# Patient Record
Sex: Female | Born: 1990 | Race: White | Hispanic: No | Marital: Married | Smoking: Never smoker
Health system: Southern US, Community
[De-identification: ages and names within clinical notes are randomized; demographics above are authoritative.]

## PROBLEM LIST (undated history)

## (undated) DIAGNOSIS — T7840XA Allergy, unspecified, initial encounter: Secondary | ICD-10-CM

## (undated) DIAGNOSIS — T4145XA Adverse effect of unspecified anesthetic, initial encounter: Secondary | ICD-10-CM

## (undated) DIAGNOSIS — I493 Ventricular premature depolarization: Secondary | ICD-10-CM

## (undated) DIAGNOSIS — F419 Anxiety disorder, unspecified: Secondary | ICD-10-CM

## (undated) DIAGNOSIS — D649 Anemia, unspecified: Secondary | ICD-10-CM

## (undated) DIAGNOSIS — F909 Attention-deficit hyperactivity disorder, unspecified type: Secondary | ICD-10-CM

## (undated) DIAGNOSIS — R112 Nausea with vomiting, unspecified: Secondary | ICD-10-CM

## (undated) DIAGNOSIS — Z8489 Family history of other specified conditions: Secondary | ICD-10-CM

## (undated) DIAGNOSIS — K603 Anal fistula, unspecified: Secondary | ICD-10-CM

## (undated) DIAGNOSIS — T8859XA Other complications of anesthesia, initial encounter: Secondary | ICD-10-CM

## (undated) DIAGNOSIS — Z9889 Other specified postprocedural states: Secondary | ICD-10-CM

## (undated) DIAGNOSIS — J45909 Unspecified asthma, uncomplicated: Secondary | ICD-10-CM

## (undated) DIAGNOSIS — K580 Irritable bowel syndrome with diarrhea: Secondary | ICD-10-CM

## (undated) DIAGNOSIS — Z01419 Encounter for gynecological examination (general) (routine) without abnormal findings: Secondary | ICD-10-CM

## (undated) HISTORY — DX: Anemia, unspecified: D64.9

## (undated) HISTORY — DX: Ventricular premature depolarization: I49.3

## (undated) HISTORY — PX: BREAST SURGERY: SHX581

## (undated) HISTORY — PX: REDUCTION MAMMAPLASTY: SUR839

## (undated) HISTORY — PX: TYMPANOPLASTY: SHX33

## (undated) HISTORY — DX: Anxiety disorder, unspecified: F41.9

## (undated) HISTORY — DX: Allergy, unspecified, initial encounter: T78.40XA

## (undated) HISTORY — PX: COSMETIC SURGERY: SHX468

## (undated) HISTORY — PX: TONSILLECTOMY: SUR1361

---
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
## 2009-01-20 LAB — URINALYSIS W/ RFLX MICROSCOPIC
Bilirubin UA, confirm: POSITIVE — AB
Glucose: 100 MG/DL — AB
Nitrites: POSITIVE — AB
Protein: 100 MG/DL — AB
Specific gravity: 1.03 (ref 1.003–1.030)
Urobilinogen: 4 EU/DL — ABNORMAL HIGH (ref 0.2–1.0)
pH (UA): 5 (ref 5.0–8.0)

## 2009-01-20 LAB — URINE MICROSCOPIC ONLY
RBC: 11 /HPF (ref 0–5)
WBC: 21 /HPF (ref 0–4)

## 2009-01-20 LAB — HCG URINE, QL: HCG urine, QL: NEGATIVE

## 2009-05-18 NOTE — Procedures (Signed)
[facility]   [address] [place], [place] [address]     NEUROSCIENCE CENTER   EVOKED POTENTIAL REPORT    PATIENT: Teresa Norman, Teresa Norman  MRN: [id number] DATE: 05/18/2009  BILLING: [id number] LOCATION:  REFERRING:  DICTATING: Emilynn Srinivasan J. Tawanna Solo, MD      VISUAL EVOKED POTENTIAL STUDY    IDENTIFICATION: A 18 year old patient of Dr. Lulu Riding.    INDICATION FOR STUDY: Loss of vision in left side and complains of  blurring of the vision since May of 2009, rule out optic neuritis.    INTRODUCTION: Pattern reversal visual evoked potential study was performed  by initially stimulating left eye at the rate of 1.9 per second. Check size  was 1.6 cm and the visual angle was 2 minutes of arc. Distance estimation  was 1 meter. There were a total of 400 stimuli given, and the study was  then repeated on the right side under a similar setting.    DESCRIPTION: N75, P100 and N145 waveforms were well identified on both  sides, and they were of normal amplitude and latencies. There was no  significant difference between the 2 sides.    IMPRESSION: This is a normal pattern reversal visual evoked potential  study. Clinical correlation suggested.       Electronically Signed   Dula Havlik J. Tawanna Solo, MD 05/22/2009 12:36   Corrie Brannen J. Tawanna Solo, MD    AJB:WMX  D: 05/18/2009 T: 05/18/2009 5:57 P  CQDocID #: [id number] CScriptDoc #: [id number]    cc: Ismail Graziani J. Tawanna Solo, MD

## 2009-05-18 NOTE — Procedures (Signed)
Procedures signed by  at 05/22/09  1236                 Author: Tawanna Solo, Minna Dumire J  Service: --  Author Type: Physician            Filed:   Date of Service: 05/18/09 1757  Status: Signed            Procedure Orders        1. VISUAL EVOKED RESPONSE [[id number]] ordered by  at 05/18/09 1757                         <!--EPICS-->                           [facility]<BR>                              [address]<BR>                         [place],  [place] [address]<BR> <BR>                             NEUROSCIENCE CENTER<BR>                           EVOKED POTENTIAL REPORT<BR> <BR> PATIENT:     Teresa Norman, Teresa Norman MRN:             [id number]       DATE:       05/18/2009<BR> BILLING:         [id number]       LOCATION:<BR> REFERRING:<BR> DICTATING:   Mykael Batz J. Kolter Reaver, MD<BR> <BR> <BR> VISUAL EVOKED POTENTIAL STUDY<BR> <BR> IDENTIFICATION:  A 18 year old patient of Dr. Edison Simon Marek Nghiem.<BR> <BR> INDICATION FOR STUDY:  Loss of vision in left side and complains  of<BR> blurring of the vision since May of 2009, rule out optic neuritis.<BR> <BR> INTRODUCTION:  Pattern reversal visual evoked potential study was performed<BR> by initially stimulating left eye at the rate of 1.9 per second. Check size<BR> was 1.6  cm and the visual angle was  2 minutes of arc. Distance estimation<BR> was 1 meter. There were a total of 400 stimuli given, and the study was<BR> then repeated on the right side under a similar setting.<BR> <BR> DESCRIPTION:  N75, P100 and N145 waveforms  were well identified on both<BR> sides, and they were of normal amplitude and latencies. There was no<BR> significant difference between the 2 sides.<BR> <BR> IMPRESSION:  This is a normal pattern reversal visual evoked potential<BR> study. Clinical correlation  suggested.<BR> <BR> <BR>       Electronically Signed<BR>       Nycere Presley J. Tawanna Solo, MD 05/22/2009 12:36<BR>                                     Kiana Hollar J. Trevel Dillenbeck, MD<BR> <BR> AJB:WMX<BR> D:  05/18/2009 T: 05/18/2009  5:57 P<BR> CQDocID #: [id number]  CScriptDoc  #: [id number]<BR> <BR> cc:   Breanah Faddis J. Raistlin Gum, MD<BR> <!--EPICE-->

## 2009-05-27 LAB — URINALYSIS W/ RFLX MICROSCOPIC
Bilirubin: NEGATIVE
Blood: NEGATIVE
Glucose: NEGATIVE MG/DL
Ketone: NEGATIVE MG/DL
Nitrites: NEGATIVE
Protein: NEGATIVE MG/DL
Specific gravity: 1.015 (ref 1.003–1.030)
Urobilinogen: 0.2 EU/DL (ref 0.2–1.0)
pH (UA): 6 (ref 5.0–8.0)

## 2009-05-27 LAB — HCG URINE, QL: HCG urine, QL: NEGATIVE

## 2009-05-27 LAB — URINE MICROSCOPIC ONLY: WBC: 4 /HPF (ref 0–4)

## 2009-05-28 LAB — CULTURE, URINE
Culture result:: NO GROWTH
Culture: NO GROWTH

## 2010-04-05 LAB — STREP THROAT SCREEN: Strep Screen: NEGATIVE

## 2010-08-23 LAB — HCG URINE, QL. - POC
Pregnancy test,urine (POC): NEGATIVE
Pregnancy test,urine (POC): NEGATIVE

## 2010-08-25 LAB — CBC WITH AUTOMATED DIFF
ABS. BASOPHILS: 0 10*3/uL (ref 0.0–0.06)
ABS. EOSINOPHILS: 0.1 10*3/uL (ref 0.0–0.4)
ABS. LYMPHOCYTES: 2.3 10*3/uL (ref 0.9–3.6)
ABS. MONOCYTES: 0.3 10*3/uL (ref 0.05–1.2)
ABS. NEUTROPHILS: 2.3 10*3/uL (ref 1.8–8.0)
BASOPHILS: 0 % (ref 0–2)
EOSINOPHILS: 2 % (ref 0–5)
HCT: 37.7 % (ref 35.0–45.0)
HGB: 12.9 g/dL (ref 12.0–16.0)
LYMPHOCYTES: 46 % (ref 21–52)
MCH: 28.5 PG (ref 24.0–34.0)
MCHC: 34.2 g/dL (ref 31.0–37.0)
MCV: 83.4 FL (ref 74.0–97.0)
MONOCYTES: 5 % (ref 3–10)
MPV: 11.3 FL (ref 9.2–11.8)
NEUTROPHILS: 47 % (ref 40–73)
PLATELET: 229 10*3/uL (ref 135–420)
RBC: 4.52 M/uL (ref 4.20–5.30)
RDW: 13.1 % (ref 11.6–14.5)
WBC: 5 10*3/uL (ref 4.6–13.2)

## 2010-08-25 LAB — METABOLIC PANEL, COMPREHENSIVE
A-G Ratio: 0.9 (ref 0.8–1.7)
ALT (SGPT): 34 U/L (ref 30–65)
AST (SGOT): 17 U/L (ref 15–37)
Albumin: 3.6 g/dL (ref 3.4–5.0)
Alk. phosphatase: 71 U/L (ref 50–136)
Anion gap: 10 mmol/L (ref 5–15)
BUN/Creatinine ratio: 13 (ref 12–20)
BUN: 9 MG/DL (ref 7–18)
Bilirubin, total: 0.5 MG/DL (ref 0.2–1.0)
CO2: 28 MMOL/L (ref 21–32)
Calcium: 9.1 MG/DL (ref 8.4–10.4)
Chloride: 102 MMOL/L (ref 100–108)
Creatinine: 0.7 MG/DL (ref 0.6–1.3)
GFR est AA: >60 mL/min/{1.73_m2} (ref >60)
GFR est non-AA: >60 mL/min/{1.73_m2} (ref >60)
Globulin: 3.8 g/dL (ref 2.0–4.0)
Glucose: 81 MG/DL (ref 74–99)
Potassium: 4 MMOL/L (ref 3.5–5.5)
Protein, total: 7.4 g/dL (ref 6.4–8.2)
Sodium: 140 MMOL/L (ref 136–145)

## 2010-08-25 LAB — URINE MICROSCOPIC ONLY
RBC: 0 /HPF (ref 0–5)
WBC: 4 /HPF (ref 0–4)

## 2010-08-25 LAB — URINALYSIS W/ RFLX MICROSCOPIC
Bilirubin: NEGATIVE
Glucose: NEGATIVE MG/DL
Ketone: NEGATIVE MG/DL
Leukocyte Esterase: NEGATIVE
Nitrites: NEGATIVE
Protein: NEGATIVE MG/DL
Specific gravity: 1.015 (ref 1.003–1.030)
Urobilinogen: 0.2 EU/DL (ref 0.2–1.0)
pH (UA): 6 (ref 5.0–8.0)

## 2010-08-25 LAB — LIPASE: Lipase: 184 U/L (ref 73–393)

## 2010-08-25 LAB — HCG URINE, QL: HCG urine, QL: NEGATIVE

## 2010-09-02 LAB — METABOLIC PANEL, BASIC
Anion gap: 10 mmol/L (ref 5–15)
BUN/Creatinine ratio: 11 — ABNORMAL LOW (ref 12–20)
BUN: 10 MG/DL (ref 7–18)
CO2: 30 MMOL/L (ref 21–32)
Calcium: 8.8 MG/DL (ref 8.4–10.4)
Chloride: 103 MMOL/L (ref 100–108)
Creatinine: 0.9 MG/DL (ref 0.6–1.3)
GFR est AA: >60 mL/min/{1.73_m2} (ref >60)
GFR est non-AA: >60 mL/min/{1.73_m2} (ref >60)
Glucose: 85 MG/DL (ref 74–99)
Potassium: 3.4 MMOL/L — ABNORMAL LOW (ref 3.5–5.5)
Sodium: 143 MMOL/L (ref 136–145)

## 2010-09-02 LAB — CBC WITH AUTOMATED DIFF
ABS. BASOPHILS: 0.1 10*3/uL — ABNORMAL HIGH (ref 0.0–0.06)
ABS. EOSINOPHILS: 0.1 10*3/uL (ref 0.0–0.4)
ABS. LYMPHOCYTES: 2.7 10*3/uL (ref 0.9–3.6)
ABS. MONOCYTES: 0.7 10*3/uL (ref 0.05–1.2)
ABS. NEUTROPHILS: 4.6 10*3/uL (ref 1.8–8.0)
BASOPHILS: 1 % (ref 0–2)
EOSINOPHILS: 2 % (ref 0–5)
HCT: 36.3 % (ref 35.0–45.0)
HGB: 12.3 g/dL (ref 12.0–16.0)
LYMPHOCYTES: 34 % (ref 21–52)
MCH: 28.6 PG (ref 24.0–34.0)
MCHC: 33.9 g/dL (ref 31.0–37.0)
MCV: 84.4 FL (ref 74.0–97.0)
MONOCYTES: 8 % (ref 3–10)
MPV: 10.9 FL (ref 9.2–11.8)
NEUTROPHILS: 55 % (ref 40–73)
PLATELET: 221 10*3/uL (ref 135–420)
RBC: 4.3 M/uL (ref 4.20–5.30)
RDW: 13.1 % (ref 11.6–14.5)
WBC: 8.1 10*3/uL (ref 4.6–13.2)

## 2010-09-02 LAB — URINALYSIS W/ RFLX MICROSCOPIC
Bilirubin: NEGATIVE
Glucose: NEGATIVE MG/DL
Ketone: NEGATIVE MG/DL
Leukocyte Esterase: NEGATIVE
Nitrites: NEGATIVE
Protein: NEGATIVE MG/DL
Specific gravity: 1.02 (ref 1.003–1.030)
Urobilinogen: 0.2 EU/DL (ref 0.2–1.0)
pH (UA): 6.5 (ref 5.0–8.0)

## 2010-09-02 LAB — URINE MICROSCOPIC ONLY
RBC: 11 /HPF (ref 0–5)
WBC: 0 /HPF (ref 0–4)

## 2010-09-02 LAB — HCG URINE, QL: HCG urine, QL: NEGATIVE

## 2011-07-23 ENCOUNTER — Other Ambulatory Visit

## 2011-07-23 MED ORDER — NORGESTIMATE-ETHINYL ESTRADIOL 0.18/0.215/0.25 MG-35 MCG(28) TAB
PACK | Freq: Every day | ORAL | Status: DC
Start: 2011-07-23 — End: 2011-08-08

## 2011-07-23 NOTE — Progress Notes (Signed)
SUBJECTIVE:  This a 20 year old white female nulligravida last menstrual period 12 5 2012 who presents for her well woman examination. She presently denies any GYN problems. She is interested in having Ortho Tri-Cyclen birth control pills prescribed. Her past medical history was reviewed and is documented. She had a significant surgery for breast reduction.[01]   OBJECTIVE:   Vital signs are stable.  She is well-developed well-nourished white female in no apparent distress.  HEENT exam revealed no thyromegaly.  Breasts had significant scars from her previous breast reduction surgery but no palpable masses.  Abdomen her soft and nontender.  External genitalia normal.  Vaginal canal revealed no significant discharge or blood.  Cervix had no lesions.  Uterus normal size and nontender.  Adnexa unremarkable[02]  ASSESSMENT:  Normal well woman exam[03]  PLAN:  1.  Patient will take Ortho Tri-Cyclen 28 daily.  2. Next well woman exam in one year.[04]

## 2011-07-23 NOTE — Patient Instructions (Signed)
MyChart Activation    Thank you for requesting access to MyChart. Please follow the instructions below to securely access and download your online medical record. MyChart allows you to send messages to your doctor, view your test results, renew your prescriptions, schedule appointments, and more.    How Do I Sign Up?    1. In your internet browser, go to [email or web address]  2. Click on the First Time User? Click Here link in the Sign In box. You will be redirect to the New Member Sign Up page.  3. Enter your MyChart Access Code exactly as it appears below. You will not need to use this code after you???ve completed the sign-up process. If you do not sign up before the expiration date, you must request a new code.    MyChart Access Code: [email or web address]  Expires: 10/21/2011  8:59 AM (This is the date your MyChart access code will expire)    4. Enter the last four digits of your Social Security Number (xxxx) and Date of Birth (mm/dd/yyyy) as indicated and click Submit. You will be taken to the next sign-up page.  5. Create a MyChart ID. This will be your MyChart login ID and cannot be changed, so think of one that is secure and easy to remember.  6. Create a MyChart password. You can change your password at any time.  7. Enter your Password Reset Question and Answer. This can be used at a later time if you forget your password.   8. Enter your e-mail address. You will receive e-mail notification when new information is available in MyChart.  9. Click Sign Up. You can now view and download portions of your medical record.  10. Click the Download Summary menu link to download a portable copy of your medical information.    Additional Information    If you have questions, please visit the Frequently Asked Questions section of the MyChart website at [email or web address] Remember, MyChart is NOT to be used for urgent needs. For medical emergencies, dial 911.

## 2011-07-26 LAB — CHLAMYDIA/GC PCR
Chlamydia trachomatis, NAA: NEGATIVE
Neisseria gonorrhoeae, NAA: NEGATIVE

## 2011-07-30 NOTE — Telephone Encounter (Signed)
Pt wants rx sent to [facility] for sprintec. It was sent to [facility].

## 2011-07-31 NOTE — Progress Notes (Signed)
HISTORY OF PRESENT ILLNESS    Teresa Norman is a 20 y.o. year old female who comes in today as a new patient to our practice to be seen for: Abnormal labs     HPI:   For 1.5 years has had issues with many BM after eating so had colonoscopy and Tx with levaquin which made things much better.  Has had pain in R hand which was worse with writing and saw Dr. Beatrix Fetters so tried steroid in wrist but didn't help much at all.  Has pain B/L hip R>L as well as R shoulder/elbow and feet swelling.  Arm worse in AM and leg issues worsen throughout the day.  Was bit by 2 ticks 3 years ago.  Was never treated for Lyme disease.  No rash.  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  No stretching, but used to dance.  No neck pain.  Normal xrays of both hands.  No other imaging.  Had breast reduction APR2009.    +FAM Hx remote lupus.  No RA.  + OA in family significantly.    Current Outpatient Prescriptions   Medication Sig Dispense Refill   ??? levalbuterol tartrate (XOPENEX HFA) 45 mcg/actuation inhaler Take  by inhalation.         ??? norgestimate-ethinyl estradiol (ORTHO TRI-CYCLEN, TRI-SPRINTEC) 0.18/0.215/0.25 mg-35 mcg (28) tablet Take 1 Tab by mouth daily.  3 Package  4     Past Medical History   Diagnosis Date   ??? Asthma    ??? Seasonal allergic rhinitis      No family history on file.  History     Social History   ??? Marital Status: Single     Spouse Name: N/A     Number of Children: N/A   ??? Years of Education: N/A     Social History Main Topics   ??? Smoking status: Never Smoker    ??? Smokeless tobacco: Never Used   ??? Alcohol Use: Yes   ??? Drug Use: No   ??? Sexually Active: Not on file     Other Topics Concern   ??? Not on file     Social History Narrative   ??? No narrative on file     ROS:  No F, C, NS, weight loss.  All other systems reviewed and negative.    Objective:  BP 118/88   Pulse 87   Temp(Src) 99.7 ??F (37.6 ??C) (Oral)   Resp 16   Ht 5' 2.99" (1.6 m)   Wt 140 lb (63.504 kg)   BMI 24.81 kg/m2   SpO2 98%   LMP 06/28/2011     GEN:  Appears stated age in NAD.  HEAD:  Normocephalic, Atraumatic.  NEURO:  Sensation intact light touch B/L upper extremities.  right hand dominant.  M/S:  Shoulder ROM Decreased  right.  Spurling's negative bilaterally  right Shoulder:  Empty can positive External rotation positive.  Internal rotation positive. O'Brien not tested.  SLAP not tested.  Strength +3/5 right intrinsic hand. Wrist, bicep.  Crossover test not tested.  Negative atrophy right hand, arm, shoulder.   Negative TTP at [employer] joint.  Apprehension test negative. Hawkins-Kennedy Test positive.  Neer Test positive.  TTP and tingling radiates with palp R serratus anterior  Yergason's test not tested.  Speed's test not tested.  + Obers R abd TTP greater trochanter.  + Mod Nobles compression B/L.  + TTP med patellar facets B/L.   EXT no Clubbing/cyanosis.  no edema.  Assessment/Plan:   1. Myalgia    2. Weakness of right hand    3. IT band syndrome      Orders Placed This Encounter   ??? C REACTIVE PROTEIN, QT   ??? SED RATE (ESR)   ??? EMG TWO EXTREMITIES UPPER     Standing Status: Future      Number of Occurrences:       Standing Expiration Date: 01/29/2012     Order Specific Question:  Reason for Exam:     Answer:  Weakness R wrist/hand   ??? levalbuterol tartrate (XOPENEX HFA) 45 mcg/actuation inhaler     Sig: Take  by inhalation.       Stretching for IT band.    Return 1 week after EMG.    Lyme titer negative as only 1 band of IgM positive and needs 2/3.  Also exposed 2-3 years ago, so should be IgG.

## 2011-08-01 LAB — C REACTIVE PROTEIN, QT: C-Reactive Protein, Qt: 7.9 mg/L — ABNORMAL HIGH (ref 0.0–4.9)

## 2011-08-01 LAB — SED RATE (ESR): Sed rate (ESR): 6 mm/hr (ref 0–32)

## 2011-08-01 MED ORDER — NORGESTIMATE-ETHINYL ESTRADIOL 0.25 MG-35 MCG TAB
PACK | Freq: Every day | ORAL | Status: AC
Start: 2011-08-01 — End: ?

## 2011-08-01 NOTE — Progress Notes (Signed)
Addended by: Aundra Dubin. on: 08/01/2011 04:40 PM     Modules accepted: Orders

## 2011-08-08 LAB — AMB POC RAPID STREP A: Group A Strep Ag: NEGATIVE

## 2011-08-08 MED ORDER — FLUTICASONE 50 MCG/ACTUATION NASAL SPRAY, SUSP
50 mcg/actuation | Freq: Every day | NASAL | Status: DC
Start: 2011-08-08 — End: 2011-09-18

## 2011-08-08 NOTE — Patient Instructions (Addendum)
Use nasal saline rinse or Neti pot with distilled water for congestion.    Nasal saline irrigation recipe:  3 tsp sea salt (without iodine), 1 tsp baking soda, 8 oz distilled water.    Over the counter decongestant (like Sudafed) as needed.  May use Nasal decongestant spray (like Afrin) as needed for max of three days.  Rest often and drink plenty of fluids.    Seek care if concerned.      Viral Respiratory Infection: After Your Visit  Your Care Instructions  Viruses are very small organisms that multiply once they enter your body. There are many types of viruses, and they cause all kinds of illnesses, including colds and the mumps. Symptoms of viral respiratory infection, such as fever, sore throat, and runny nose, usually come on quickly. Often when you have a viral respiratory infection, you do not feel well and may not feel like eating much.  Most viral respiratory infections are not serious and will get better with time and self-care. Antibiotics are not used to treat a viral infection and will not help cure a viral illness. In some cases, antiviral medicine can help your body fight a serious viral infection.  Follow-up care is a key part of your treatment and safety. Be sure to make and go to all appointments, and call your doctor if you are having problems. It???s also a good idea to know your test results and keep a list of the medicines you take.  How can you care for yourself at home?  ?? Rest as much as possible until you feel better.   ?? Take your medicine exactly as prescribed. Call your doctor if you think you are having a problem with your medicine. You will get more details on the specific medicine your doctor prescribes.    ?? Take an over-the-counter pain medicine, such as acetaminophen (Tylenol), ibuprofen (Advil, Motrin), or naproxen (Aleve), as needed for pain and fever. Read and follow all instructions on the label. Do not give aspirin to anyone younger than 20. It has been linked to Reye syndrome, a serious illness.   ?? Drink plenty of fluids, enough so that your urine is light yellow or clear like water. Hot fluids, such as tea or soup, may help relieve congestion in your nose and throat. If you have kidney, heart, or liver disease and have to limit fluids, talk with your doctor before you increase the amount of fluids you drink.   ?? Try to clear mucus from your lungs by breathing deeply and coughing.   ?? Gargle with warm salt water once an hour to help reduce swelling and throat pain. Use 1 teaspoon of salt mixed in 1 cup of warm water.   ?? Do not smoke or allow others to smoke around you. If you need help quitting, talk to your doctor about stop-smoking programs and medicines. These can increase your chances of quitting for good.   To avoid spreading the virus  ?? Cough or sneeze into a tissue, and then throw it away.   ?? If you do not have a tissue, cover your cough or sneeze with your hand, and then clean your hand. You can also cough into your sleeve.   ?? Wash your hands with soap and warm water frequently. Wash for 15 to 20 seconds each time.   ?? If you do not have soap and water nearby, alcohol-based hand wipes or gel will work.   When should you call for help?    Call your doctor now or seek immediate medical care if:  ?? You have a new or higher fever.   ?? Your fever lasts more than 48 hours.   ?? You have trouble breathing.   ?? You have a fever with a stiff neck or a severe headache.   ?? You are sensitive to light or feel very sleepy or confused.   Watch closely for changes in your health, and be sure to contact your doctor if:  ?? You do not get better as expected.     Where can you learn more?     Go to http://www.healthwise.net/[facility]   Enter Q795 in the search box to learn more about "Viral Respiratory Infection: After Your Visit."    ?? 2006-2012 Healthwise, Inc[employer]. Care instructions adapted under license by [employer] (which disclaims liability or warranty for this information). This care instruction is for use with your licensed healthcare professional. If you have questions about a medical condition or this instruction, always ask your healthcare professional. Healthwise, Incorporated disclaims any warranty or liability for your use of this information.  Content Version: 9.4.94723; Last Revised: July 14, 2009

## 2011-08-08 NOTE — Progress Notes (Signed)
HISTORY OF PRESENT ILLNESS    Teresa Norman is a 20 y.o. year old female who is coming in today for a follow up for:  Sore throat      Patients symptoms have been present for 3-4 days.  Pain level 8, It has significantly worsened with sallowing.  Patient has tried:  Zyrtec w/o benefit.  It is described as raw sore throat with minimal cough. No F, C, but 99.something Saturday.    Current Outpatient Prescriptions   Medication Sig Dispense Refill   ??? norgestimate-ethinyl estradiol (SPRINTEC, 28,) 0.25-35 mg-mcg per tablet Take 1 Tab by mouth daily.  3 Package  4   ??? levalbuterol tartrate (XOPENEX HFA) 45 mcg/actuation inhaler Take  by inhalation.             Past Medical History   Diagnosis Date   ??? Asthma    ??? Seasonal allergic rhinitis      ROS:  Nose is fine.  Breathing is fine.    Objective:  BP 112/72   Pulse 99   Temp(Src) 97.2 ??F (36.2 ??C) (Oral)   Resp 16   Ht 5\' 2"  (1.575 m)   Wt 139 lb 9.6 oz (63.322 kg)   BMI 25.53 kg/m2   SpO2 98%   LMP 08/02/2011    GEN:  Appears stated age in NAD.  HEENT: Conjunctiva/lids normal.  External canals/nares normal.  TM normal bilaterally.   Nose: mucosa erythematous and swollen and clear rhinorrhea  Tongue midline.  Throat cobblestoning present. Exudates not present.  tonsils are present and normal.  NECK: Trachea midline.  No masses.  Anterior cervical adenopathy  CARDIAC:  regular rate and rhythm. no Murmur, no peripheral edema.  LUNGS: lungs clear to auscultation, no accessory muscle use.  MS: no clubbing/cyanosis.  SKIN: Warm/dry without rash.    Results for orders placed in visit on 08/08/11   AMB POC RAPID STREP A       Component Value Range    Group A Strep Ag negative  Negative     Assessment/Plan:   1. URI (upper respiratory infection)  fluticasone (FLONASE) 50 mcg/actuation nasal spray   2. Sore throat  AMB POC RAPID STREP A     Discussed need for OTC/supportive care.    RTC as needed.

## 2011-09-18 MED ORDER — PREDNISONE 10 MG TABLETS IN A DOSE PACK
10 mg | ORAL_TABLET | ORAL | Status: AC
Start: 2011-09-18 — End: ?

## 2011-09-18 MED ORDER — CYCLOBENZAPRINE 10 MG TAB
10 mg | ORAL_TABLET | Freq: Three times a day (TID) | ORAL | Status: AC | PRN
Start: 2011-09-18 — End: ?

## 2011-09-18 NOTE — Progress Notes (Signed)
HISTORY OF PRESENT ILLNESS    Teresa Norman is a 21 y.o. year old female here today to follow up for:  EMG results (find in Media)    Has had issues with RUQ pain and swelling for months.  EMG done [id number] was negative.  Does have issues where her hand will cramp up when trying to write things.  Doesn't seem to have any obvious cause, and will happen when she wakes in the morning.  Has only had aleve, ibuprofen thus far.    FAMHx: distant cutaneous lupus (grandmother's aunt), No RA    Current Outpatient Prescriptions   Medication Sig Dispense Refill   ??? norgestimate-ethinyl estradiol (SPRINTEC, 28,) 0.25-35 mg-mcg per tablet Take 1 Tab by mouth daily.  3 Package  4   ??? levalbuterol tartrate (XOPENEX HFA) 45 mcg/actuation inhaler Take  by inhalation.           Past Medical History   Diagnosis Date   ??? Asthma    ??? Seasonal allergic rhinitis      ROS:  +numb, tingle.  + weakness RUE.  + swelling.    Objective:  BP 100/72   Pulse 68   Temp(Src) 98 ??F (36.7 ??C) (Oral)   Resp 16   Ht 5\' 2"  (1.575 m)   Wt 136 lb (61.689 kg)   BMI 24.87 kg/m2   SpO2 98%  Bicep/tricep reflex +2/4 B/L.  Strength +4/5 intrinsic hand, tricep, bicep, wrist dorsiflexion/extension.  Decreased sensation B/L sides of hand and outer forearm and inner upper arm.  No atrophy.    Assessment/Plan:   1. Hand swelling  REFERRAL TO PHYSICIAL MEDICINE REHAB, predniSONE (STERAPRED DS) 10 mg dose pack, cyclobenzaprine (FLEXERIL) 10 mg tablet   2. Arm pain  REFERRAL TO PHYSICIAL MEDICINE REHAB, predniSONE (STERAPRED DS) 10 mg dose pack, cyclobenzaprine (FLEXERIL) 10 mg tablet   3. Arm weakness  REFERRAL TO PHYSICIAL MEDICINE REHAB, predniSONE (STERAPRED DS) 10 mg dose pack, cyclobenzaprine (FLEXERIL) 10 mg tablet     Will try PMR first as condition does not seem rheum with neg serologies prior.

## 2013-08-13 HISTORY — PX: LEEP: SHX91

## 2016-08-13 DIAGNOSIS — O139 Gestational [pregnancy-induced] hypertension without significant proteinuria, unspecified trimester: Secondary | ICD-10-CM

## 2016-08-13 HISTORY — DX: Gestational (pregnancy-induced) hypertension without significant proteinuria, unspecified trimester: O13.9

## 2016-08-20 ENCOUNTER — Ambulatory Visit
Admit: 2016-08-20 | Discharge: 2016-08-20 | Payer: PRIVATE HEALTH INSURANCE | Attending: Orthopaedic Surgery | Primary: Sports Medicine

## 2016-08-20 DIAGNOSIS — M898X1 Other specified disorders of bone, shoulder: Secondary | ICD-10-CM

## 2016-08-20 NOTE — Progress Notes (Signed)
Patient: Teresa Norman                MRN: [id number]       SSN: [id number]  Date of Birth: 1991/08/02        AGE: 26 y.o.        SEX: female  Body mass index is 28.35 kg/(m^2).    PCP: Steffanie Dunn, DO  08/20/16    Chief Complaint: Left shoulder pain    HPI: Teresa Norman is a 26 y.o. female who comes to the office today with a complaint of left shoulder pain following a fall.  On 08/19/2016 while out walking her dogs she slipped on ice and landed on her left side.  She initially had left hip pain, which is doing better, but that night noted worsening left shoulder/neck and chest pain from the fall.  She has been wearing a figure of 8 brace from her home, with some help.  She is also taking NSAIDs without complete resolution.  She complains of shoulder stiffness and neck pain accompanying the injury.  She has noted some numbness and tingling in the ulnar nerve distribution since starting to wear the figure of 8 brace.  No antecedent complaints.  She has not had any x rays.     Past Medical History:   Diagnosis Date   ??? Asthma    ??? Seasonal allergic rhinitis        History reviewed. No pertinent family history.    Current Outpatient Prescriptions   Medication Sig Dispense Refill   ??? levalbuterol tartrate (XOPENEX HFA) 45 mcg/actuation inhaler Take  by inhalation.       ??? predniSONE (STERAPRED DS) 10 mg dose pack See administration instruction per 10mg  dose pack 21 Tab 0   ??? cyclobenzaprine (FLEXERIL) 10 mg tablet Take 0.5-1 Tabs by mouth three (3) times daily as needed for Muscle Spasm(s). 60 Tab 1   ??? norgestimate-ethinyl estradiol (SPRINTEC, 28,) 0.25-35 mg-mcg per tablet Take 1 Tab by mouth daily. 3 Package 4       Allergies   Allergen Reactions   ??? Sulfa (Sulfonamide Antibiotics) Anaphylaxis and Itching   ??? Codeine Nausea and Vomiting   ??? Vicodin [Hydrocodone-Acetaminophen] Itching       Past Surgical History:   Procedure Laterality Date   ??? HX BREAST REDUCTION     ??? HX TONSILLECTOMY          Social History     Social History   ??? Marital status: SINGLE     Spouse name: N/A   ??? Number of children: N/A   ??? Years of education: N/A     Occupational History   ??? Not on file.     Social History Main Topics   ??? Smoking status: Never Smoker   ??? Smokeless tobacco: Never Used   ??? Alcohol use Yes   ??? Drug use: No   ??? Sexual activity: Not on file     Other Topics Concern   ??? Not on file     Social History Narrative       REVIEW OF SYSTEMS:      CON: negative for recent weight loss/gain, fever, or chills  EYE: negative for double or blurry vision  ENT: negative for hoarseness  RS:   negative for cough, URI, SOB  CV:  negative for chest pain, palpitations  GI:    negative for blood in stool, nausea/vomiting  GU:  negative for blood in urine  MS: As per HPI  Other systems reviewed and noted below.    PHYSICAL EXAMINATION:  GENERAL: Alert and oriented x3, in no acute distress, well-developed, well-nourished.  HEENT: Normocephalic, atraumatic.    RESP: Non labored breathing with equal chest rise on inspiration.  CV: Well perfused extremities.  No cyanosis or clubbing noted.  ABDOMEN: Soft, non-tender, non-distended.   SPINE: C spine with paraspinal TTP.  No midline TTP.  Full neck ROM.  Pain with lateral rotation and head tilt.  GAIT: Normal gait  MUSCULOSKELETAL: Left shoulder without signs of trauma.  No ecchymosis.  No warmth.  Globally TTP over soft tissues about the shoulder including platysma attachment to clavicle, caracoid process, periscapular muscles, deltoid.  No acromial tenderness.  Shoulder ROM limited active and passive to FF 100, ER 10, IR PL Buttock. Pain with attempts past these limits.  SILT/NVI distally. 5/5 strength deltoid.  No weakness or pain with gentle shoulder ER/IR strength testing.        Radiology: x rays of the left clavicle are negative for any fractures or other acute bony injury    Impression/Plan:   1. Left shoulder contusion:  I have recommended conservative treatment for  her injury at this time.  I do not see any fractures and most of her pain is soft tissue in origin.  I have encouraged her to start light ADL use of the left arm to hopefully prevent any worsening of her stiffness up to this point.  I will see her back in a week to recheck her shoulder.  All of her questions were answered.       Electronically signed by: Jim Likeaniel T Logun Colavito, MD

## 2016-08-20 NOTE — Patient Instructions (Addendum)
Bruises: Care Instructions  Your Care Instructions    Bruises occur when small blood vessels under the skin tear or rupture, most often from a twist, bump, or fall. Blood leaks into tissues under the skin and causes a black-and-blue spot that often turns colors, including purplish black, reddish blue, or yellowish green, as the bruise heals.  Bruises hurt, but most are not serious and will go away on their own within 2 to 4 weeks. Sometimes, gravity causes them to spread down the body. A leg bruise usually will take longer to heal than a bruise on the face or arms.  Follow-up care is a key part of your treatment and safety. Be sure to make and go to all appointments, and call your doctor if you are having problems. It's also a good idea to know your test results and keep a list of the medicines you take.  How can you care for yourself at home?  ?? Take pain medicines exactly as directed.  ?? If the doctor gave you a prescription medicine for pain, take it as prescribed.  ?? If you are not taking a prescription pain medicine, ask your doctor if you can take an over-the-counter medicine.  ?? Put ice or a cold pack on the area for 10 to 20 minutes at a time. Put a thin cloth between the ice and your skin.  ?? If you can, prop up the bruised area on pillows as much as possible for the next few days. Try to keep the bruise above the level of your heart.  When should you call for help?  Call your doctor now or seek immediate medical care if:  ? ?? You have signs of infection, such as:  ?? Increased pain, swelling, warmth, or redness.  ?? Red streaks leading from the bruise.  ?? Pus draining from the bruise.  ?? A fever.   ? ?? You have a bruise on your leg and signs of a blood clot, such as:  ?? Increasing redness and swelling along with warmth, tenderness, and pain in the bruised area.  ?? Pain in your calf, back of the knee, thigh, or groin.  ?? Redness and swelling in your leg or groin.   ? ?? Your pain gets worse.    ?Watch closely for changes in your health, and be sure to contact your doctor if:  ? ?? You do not get better as expected.   Where can you learn more?  Go to http://www.healthwise.net/GoodHelpConnections.  Enter T177 in the search box to learn more about "Bruises: Care Instructions."  Current as of: October 31, 2015  Content Version: 11.4  ?? 2006-2017 Healthwise, Inc[employer]. Care instructions adapted under license by [employer] (which disclaims liability or warranty for this information). If you have questions about a medical condition or this instruction, always ask your healthcare professional. Healthwise, Incorporated disclaims any warranty or liability for your use of this information.

## 2017-06-18 DIAGNOSIS — Z8489 Family history of other specified conditions: Secondary | ICD-10-CM | POA: Insufficient documentation

## 2017-10-09 DIAGNOSIS — J302 Other seasonal allergic rhinitis: Secondary | ICD-10-CM | POA: Insufficient documentation

## 2017-10-09 DIAGNOSIS — D649 Anemia, unspecified: Secondary | ICD-10-CM | POA: Insufficient documentation

## 2018-02-25 LAB — HM PAP SMEAR: HM Pap smear: NEGATIVE

## 2018-03-31 ENCOUNTER — Ambulatory Visit: Payer: Self-pay | Admitting: Physician Assistant

## 2018-04-04 ENCOUNTER — Encounter: Payer: Self-pay | Admitting: Physician Assistant

## 2018-04-04 ENCOUNTER — Ambulatory Visit (INDEPENDENT_AMBULATORY_CARE_PROVIDER_SITE_OTHER): Payer: 59 | Admitting: Physician Assistant

## 2018-04-04 VITALS — BP 108/72 | HR 96 | Temp 99.1°F | Resp 16 | Ht 62.0 in | Wt 159.0 lb

## 2018-04-04 DIAGNOSIS — Z13220 Encounter for screening for lipoid disorders: Secondary | ICD-10-CM

## 2018-04-04 DIAGNOSIS — D649 Anemia, unspecified: Secondary | ICD-10-CM

## 2018-04-04 DIAGNOSIS — Z131 Encounter for screening for diabetes mellitus: Secondary | ICD-10-CM

## 2018-04-04 DIAGNOSIS — Z1329 Encounter for screening for other suspected endocrine disorder: Secondary | ICD-10-CM | POA: Diagnosis not present

## 2018-04-04 NOTE — Progress Notes (Signed)
Patient: Melanie Valdez Female    DOB: 1990/12/24   27 y.o.   MRN: [id number] Visit Date: 04/04/2018  Today's Provider: Trey Sailors, PA-C   Chief Complaint  Patient presents with  . New Patient (Initial Visit)   Subjective:    HPI   Patient comes in today as a new patient wanting to establish care into the practice. She reports that she has not had a PCP in several years. She feels well today with no complaints.   Married 2017 for 2.5 years, 9 month daughter. Thinks she may be pregnant now, has had 5 early miscarriages. Has appt with [facility] obgyn on 04/07/2018. She is currently taking prenatal vitamin, no smoking, no alcohol. She had a LEEP in 2015 and subsequent PAPs were normal.      Allergies  Allergen Reactions  . Codeine   . Sulfa Antibiotics      Current Outpatient Medications:  .  docusate sodium (COLACE) 100 MG capsule, Take 100 mg by mouth daily as needed for mild constipation., Disp: , Rfl:  .  fexofenadine (ALLEGRA) 180 MG tablet, Take 180 mg by mouth daily., Disp: , Rfl:  .  fluticasone (FLONASE) 50 MCG/ACT nasal spray, Place 2 sprays into both nostrils daily., Disp: , Rfl:  .  Prenatal Vit-Fe Fumarate-FA (PRENATAL VITAMIN PO), Take by mouth daily., Disp: , Rfl:   Review of Systems  Constitutional: Negative.   HENT: Negative.   Eyes: Negative.   Respiratory: Negative.   Cardiovascular: Negative.   Gastrointestinal: Negative.   Endocrine: Negative.   Genitourinary: Negative.   Musculoskeletal: Negative.   Skin: Negative.   Allergic/Immunologic: Negative.   Neurological: Negative.   Hematological: Negative.   Psychiatric/Behavioral: Negative.     Social History   Tobacco Use  . Smoking status: Never Smoker  . Smokeless tobacco: Never Used  Substance Use Topics  . Alcohol use: Yes    Alcohol/week: 1.0 standard drinks    Types: 1 Glasses of wine per week   Objective:   BP 108/72 (BP Location: Right Arm, Patient Position: Sitting, Cuff Size:  Normal)   Pulse 96   Temp 99.1 F (37.3 C)   Resp 16   Ht 5\' 2"  (1.575 m)   Wt 159 lb (72.1 kg)   SpO2 100%   BMI 29.08 kg/m  Vitals:   04/04/18 1410  BP: 108/72  Pulse: 96  Resp: 16  Temp: 99.1 F (37.3 C)  SpO2: 100%  Weight: 159 lb (72.1 kg)  Height: 5\' 2"  (1.575 m)     Physical Exam  Constitutional: She is oriented to person, place, and time. She appears well-developed and well-nourished.  HENT:  Right Ear: External ear normal.  Left Ear: External ear normal.  Mouth/Throat: Oropharynx is clear and moist.  Neck: Neck supple.  Cardiovascular: Normal rate and regular rhythm.  Pulmonary/Chest: Effort normal and breath sounds normal.  Abdominal: Soft. Bowel sounds are normal.  Lymphadenopathy:    She has no cervical adenopathy.  Neurological: She is alert and oriented to person, place, and time.  Skin: Skin is warm and dry.  Psychiatric: She has a normal mood and affect. Her behavior is normal.        Assessment & Plan:     1. Anemia, unspecified type  - CBC with Differential  2. Thyroid disorder screening  - TSH  3. Lipid screening  - Lipid Profile  4. Diabetes mellitus screening  - Comprehensive Metabolic Panel (CMET)  Return  if symptoms worsen or fail to improve.  The entirety of the information documented in the History of Present Illness, Review of Systems and Physical Exam were personally obtained by me. Portions of this information were initially documented by Anson Oregonachelle Presley, CMA and reviewed by me for thoroughness and accuracy.           Trey SailorsAdriana M Anesha Hackert, PA-C  [facility]

## 2018-04-04 NOTE — Patient Instructions (Signed)
Anemia Anemia is a condition in which you do not have enough red blood cells or hemoglobin. Hemoglobin is a substance in red blood cells that carries oxygen. When you do not have enough red blood cells or hemoglobin (are anemic), your body cannot get enough oxygen and your organs may not work properly. As a result, you may feel very tired or have other problems. What are the causes? Common causes of anemia include:  Excessive bleeding. Anemia can be caused by excessive bleeding inside or outside the body, including bleeding from the intestine or from periods in women.  Poor nutrition.  Long-lasting (chronic) kidney, thyroid, and liver disease.  Bone marrow disorders.  Cancer and treatments for cancer.  HIV (human immunodeficiency virus) and AIDS (acquired immunodeficiency syndrome).  Treatments for HIV and AIDS.  Spleen problems.  Blood disorders.  Infections, medicines, and autoimmune disorders that destroy red blood cells.  What are the signs or symptoms? Symptoms of this condition include:  Minor weakness.  Dizziness.  Headache.  Feeling heartbeats that are irregular or faster than normal (palpitations).  Shortness of breath, especially with exercise.  Paleness.  Cold sensitivity.  Indigestion.  Nausea.  Difficulty sleeping.  Difficulty concentrating.  Symptoms may occur suddenly or develop slowly. If your anemia is mild, you may not have symptoms. How is this diagnosed? This condition is diagnosed based on:  Blood tests.  Your medical history.  A physical exam.  Bone marrow biopsy.  Your health care provider may also check your stool (feces) for blood and may do additional testing to look for the cause of your bleeding. You may also have other tests, including:  Imaging tests, such as a CT scan or MRI.  Endoscopy.  Colonoscopy.  How is this treated? Treatment for this condition depends on the cause. If you continue to lose a lot of blood,  you may need to be treated at a hospital. Treatment may include:  Taking supplements of iron, vitamin B12, or folic acid.  Taking a hormone medicine (erythropoietin) that can help to stimulate red blood cell growth.  Having a blood transfusion. This may be needed if you lose a lot of blood.  Making changes to your diet.  Having surgery to remove your spleen.  Follow these instructions at home:  Take over-the-counter and prescription medicines only as told by your health care provider.  Take supplements only as told by your health care provider.  Follow any diet instructions that you were given.  Keep all follow-up visits as told by your health care provider. This is important. Contact a health care provider if:  You develop new bleeding anywhere in the body. Get help right away if:  You are very weak.  You are short of breath.  You have pain in your abdomen or chest.  You are dizzy or feel faint.  You have trouble concentrating.  You have bloody or black, tarry stools.  You vomit repeatedly or you vomit up blood. Summary  Anemia is a condition in which you do not have enough red blood cells or enough of a substance in your red blood cells that carries oxygen (hemoglobin).  Symptoms may occur suddenly or develop slowly.  If your anemia is mild, you may not have symptoms.  This condition is diagnosed with blood tests as well as a medical history and physical exam. Other tests may be needed.  Treatment for this condition depends on the cause of the anemia. This information is not intended to replace advice   given to you by your health care provider. Make sure you discuss any questions you have with your health care provider. Document Released: 09/06/2004 Document Revised: 08/31/2016 Document Reviewed: 08/31/2016 Elsevier Interactive Patient Education  [employer].

## 2018-04-05 LAB — LIPID PANEL
Chol/HDL Ratio: 4.5 ratio — ABNORMAL HIGH (ref 0.0–4.4)
Cholesterol, Total: 233 mg/dL — ABNORMAL HIGH (ref 100–199)
HDL: 52 mg/dL (ref >39)
LDL Calculated: 159 mg/dL — ABNORMAL HIGH (ref 0–99)
Triglycerides: 112 mg/dL (ref 0–149)
VLDL Cholesterol Cal: 22 mg/dL (ref 5–40)

## 2018-04-05 LAB — COMPREHENSIVE METABOLIC PANEL
ALT: 21 IU/L (ref 0–32)
AST: 18 IU/L (ref 0–40)
Albumin/Globulin Ratio: 1.5 (ref 1.2–2.2)
Albumin: 4.6 g/dL (ref 3.5–5.5)
Alkaline Phosphatase: 78 IU/L (ref 39–117)
BUN/Creatinine Ratio: 14 (ref 9–23)
BUN: 12 mg/dL (ref 6–20)
Bilirubin Total: 1 mg/dL (ref 0.0–1.2)
CO2: 24 mmol/L (ref 20–29)
Calcium: 10.1 mg/dL (ref 8.7–10.2)
Chloride: 100 mmol/L (ref 96–106)
Creatinine, Ser: 0.83 mg/dL (ref 0.57–1.00)
GFR calc Af Amer: 111 mL/min/{1.73_m2} (ref >59)
GFR calc non Af Amer: 96 mL/min/{1.73_m2} (ref >59)
Globulin, Total: 3 g/dL (ref 1.5–4.5)
Glucose: 76 mg/dL (ref 65–99)
Potassium: 4.2 mmol/L (ref 3.5–5.2)
Sodium: 139 mmol/L (ref 134–144)
Total Protein: 7.6 g/dL (ref 6.0–8.5)

## 2018-04-05 LAB — CBC WITH DIFFERENTIAL/PLATELET
Basophils Absolute: 0 10*3/uL (ref 0.0–0.2)
Basos: 0 %
EOS (ABSOLUTE): 0.1 10*3/uL (ref 0.0–0.4)
Eos: 1 %
Hematocrit: 39.7 % (ref 34.0–46.6)
Hemoglobin: 13.2 g/dL (ref 11.1–15.9)
Immature Grans (Abs): 0 10*3/uL (ref 0.0–0.1)
Immature Granulocytes: 0 %
Lymphocytes Absolute: 2.1 10*3/uL (ref 0.7–3.1)
Lymphs: 28 %
MCH: 28 pg (ref 26.6–33.0)
MCHC: 33.2 g/dL (ref 31.5–35.7)
MCV: 84 fL (ref 79–97)
Monocytes Absolute: 0.6 10*3/uL (ref 0.1–0.9)
Monocytes: 8 %
Neutrophils Absolute: 4.5 10*3/uL (ref 1.4–7.0)
Neutrophils: 63 %
Platelets: 271 10*3/uL (ref 150–450)
RBC: 4.71 x10E6/uL (ref 3.77–5.28)
RDW: 14.1 % (ref 12.3–15.4)
WBC: 7.3 10*3/uL (ref 3.4–10.8)

## 2018-04-05 LAB — TSH: TSH: 3.39 u[IU]/mL (ref 0.450–4.500)

## 2018-04-08 ENCOUNTER — Telehealth: Payer: Self-pay

## 2018-04-08 NOTE — Telephone Encounter (Signed)
-----   Message from Trey SailorsAdriana M Pollak, [place]PA-C sent at 04/07/2018  4:52 PM EDT ----- Labs normal except cholesterol, which is high. Doesn't require treatment at this point but would recommend reducing saturated fat intake and increasing exercise.

## 2018-04-08 NOTE — Telephone Encounter (Signed)
Patient advised as below.  

## 2018-04-24 DIAGNOSIS — J45909 Unspecified asthma, uncomplicated: Secondary | ICD-10-CM | POA: Insufficient documentation

## 2018-04-24 DIAGNOSIS — B009 Herpesviral infection, unspecified: Secondary | ICD-10-CM | POA: Insufficient documentation

## 2018-04-24 DIAGNOSIS — G8929 Other chronic pain: Secondary | ICD-10-CM | POA: Insufficient documentation

## 2018-04-24 DIAGNOSIS — R519 Headache, unspecified: Secondary | ICD-10-CM | POA: Insufficient documentation

## 2018-08-13 DIAGNOSIS — I493 Ventricular premature depolarization: Secondary | ICD-10-CM

## 2018-08-13 HISTORY — DX: Ventricular premature depolarization: I49.3

## 2018-08-14 DIAGNOSIS — O0992 Supervision of high risk pregnancy, unspecified, second trimester: Secondary | ICD-10-CM | POA: Diagnosis not present

## 2018-08-14 DIAGNOSIS — Z3A14 14 weeks gestation of pregnancy: Secondary | ICD-10-CM | POA: Diagnosis not present

## 2018-08-14 DIAGNOSIS — O0991 Supervision of high risk pregnancy, unspecified, first trimester: Secondary | ICD-10-CM | POA: Diagnosis not present

## 2018-09-15 DIAGNOSIS — O26892 Other specified pregnancy related conditions, second trimester: Secondary | ICD-10-CM | POA: Diagnosis not present

## 2018-09-15 DIAGNOSIS — O09892 Supervision of other high risk pregnancies, second trimester: Secondary | ICD-10-CM | POA: Diagnosis not present

## 2018-09-15 DIAGNOSIS — O99512 Diseases of the respiratory system complicating pregnancy, second trimester: Secondary | ICD-10-CM | POA: Diagnosis not present

## 2018-09-15 DIAGNOSIS — Z3A27 27 weeks gestation of pregnancy: Secondary | ICD-10-CM | POA: Diagnosis not present

## 2018-09-15 DIAGNOSIS — J45909 Unspecified asthma, uncomplicated: Secondary | ICD-10-CM | POA: Diagnosis not present

## 2018-09-18 DIAGNOSIS — J310 Chronic rhinitis: Secondary | ICD-10-CM | POA: Diagnosis not present

## 2018-09-18 DIAGNOSIS — J329 Chronic sinusitis, unspecified: Secondary | ICD-10-CM | POA: Diagnosis not present

## 2018-09-18 DIAGNOSIS — O99810 Abnormal glucose complicating pregnancy: Secondary | ICD-10-CM | POA: Diagnosis not present

## 2018-09-18 DIAGNOSIS — H6505 Acute serous otitis media, recurrent, left ear: Secondary | ICD-10-CM | POA: Diagnosis not present

## 2018-09-18 DIAGNOSIS — J189 Pneumonia, unspecified organism: Secondary | ICD-10-CM | POA: Diagnosis not present

## 2018-09-29 DIAGNOSIS — O99810 Abnormal glucose complicating pregnancy: Secondary | ICD-10-CM | POA: Diagnosis not present

## 2018-09-29 DIAGNOSIS — Z8759 Personal history of other complications of pregnancy, childbirth and the puerperium: Secondary | ICD-10-CM | POA: Diagnosis not present

## 2018-09-29 DIAGNOSIS — J4520 Mild intermittent asthma, uncomplicated: Secondary | ICD-10-CM | POA: Diagnosis not present

## 2018-09-29 DIAGNOSIS — O925 Suppressed lactation: Secondary | ICD-10-CM | POA: Diagnosis not present

## 2018-09-29 DIAGNOSIS — Z9889 Other specified postprocedural states: Secondary | ICD-10-CM | POA: Diagnosis not present

## 2018-09-29 DIAGNOSIS — O09899 Supervision of other high risk pregnancies, unspecified trimester: Secondary | ICD-10-CM | POA: Diagnosis not present

## 2018-09-29 DIAGNOSIS — Z98891 History of uterine scar from previous surgery: Secondary | ICD-10-CM | POA: Diagnosis not present

## 2018-09-29 DIAGNOSIS — B009 Herpesviral infection, unspecified: Secondary | ICD-10-CM | POA: Diagnosis not present

## 2018-09-29 DIAGNOSIS — R51 Headache: Secondary | ICD-10-CM | POA: Diagnosis not present

## 2018-09-29 DIAGNOSIS — O0991 Supervision of high risk pregnancy, unspecified, first trimester: Secondary | ICD-10-CM | POA: Diagnosis not present

## 2018-09-29 DIAGNOSIS — E663 Overweight: Secondary | ICD-10-CM | POA: Diagnosis not present

## 2018-10-06 DIAGNOSIS — O99013 Anemia complicating pregnancy, third trimester: Secondary | ICD-10-CM | POA: Diagnosis not present

## 2018-10-06 DIAGNOSIS — E876 Hypokalemia: Secondary | ICD-10-CM | POA: Diagnosis not present

## 2018-10-06 DIAGNOSIS — O26893 Other specified pregnancy related conditions, third trimester: Secondary | ICD-10-CM | POA: Diagnosis not present

## 2018-10-06 DIAGNOSIS — Z3A31 31 weeks gestation of pregnancy: Secondary | ICD-10-CM | POA: Diagnosis not present

## 2018-10-06 DIAGNOSIS — R51 Headache: Secondary | ICD-10-CM | POA: Diagnosis not present

## 2018-10-13 DIAGNOSIS — Z363 Encounter for antenatal screening for malformations: Secondary | ICD-10-CM | POA: Diagnosis not present

## 2018-10-13 DIAGNOSIS — Z3A31 31 weeks gestation of pregnancy: Secondary | ICD-10-CM | POA: Diagnosis not present

## 2018-10-13 DIAGNOSIS — Z3A32 32 weeks gestation of pregnancy: Secondary | ICD-10-CM | POA: Diagnosis not present

## 2018-10-13 DIAGNOSIS — J45909 Unspecified asthma, uncomplicated: Secondary | ICD-10-CM | POA: Diagnosis not present

## 2018-10-13 DIAGNOSIS — O99513 Diseases of the respiratory system complicating pregnancy, third trimester: Secondary | ICD-10-CM | POA: Diagnosis not present

## 2018-10-13 DIAGNOSIS — O0993 Supervision of high risk pregnancy, unspecified, third trimester: Secondary | ICD-10-CM | POA: Diagnosis not present

## 2018-10-15 DIAGNOSIS — Z209 Contact with and (suspected) exposure to unspecified communicable disease: Secondary | ICD-10-CM | POA: Diagnosis not present

## 2018-10-16 DIAGNOSIS — B9689 Other specified bacterial agents as the cause of diseases classified elsewhere: Secondary | ICD-10-CM | POA: Diagnosis not present

## 2018-10-16 DIAGNOSIS — J019 Acute sinusitis, unspecified: Secondary | ICD-10-CM | POA: Diagnosis not present

## 2018-10-16 DIAGNOSIS — S0993XD Unspecified injury of face, subsequent encounter: Secondary | ICD-10-CM | POA: Diagnosis not present

## 2018-10-24 DIAGNOSIS — O98313 Other infections with a predominantly sexual mode of transmission complicating pregnancy, third trimester: Secondary | ICD-10-CM | POA: Diagnosis not present

## 2018-10-24 DIAGNOSIS — O9989 Other specified diseases and conditions complicating pregnancy, childbirth and the puerperium: Secondary | ICD-10-CM | POA: Diagnosis not present

## 2018-10-24 DIAGNOSIS — D509 Iron deficiency anemia, unspecified: Secondary | ICD-10-CM | POA: Diagnosis not present

## 2018-10-24 DIAGNOSIS — O2623 Pregnancy care for patient with recurrent pregnancy loss, third trimester: Secondary | ICD-10-CM | POA: Diagnosis not present

## 2018-10-24 DIAGNOSIS — O99013 Anemia complicating pregnancy, third trimester: Secondary | ICD-10-CM | POA: Diagnosis not present

## 2018-10-24 DIAGNOSIS — Z882 Allergy status to sulfonamides status: Secondary | ICD-10-CM | POA: Diagnosis not present

## 2018-10-24 DIAGNOSIS — O99513 Diseases of the respiratory system complicating pregnancy, third trimester: Secondary | ICD-10-CM | POA: Diagnosis not present

## 2018-10-24 DIAGNOSIS — O4703 False labor before 37 completed weeks of gestation, third trimester: Secondary | ICD-10-CM | POA: Diagnosis not present

## 2018-10-24 DIAGNOSIS — O23593 Infection of other part of genital tract in pregnancy, third trimester: Secondary | ICD-10-CM | POA: Diagnosis not present

## 2018-10-24 DIAGNOSIS — J45909 Unspecified asthma, uncomplicated: Secondary | ICD-10-CM | POA: Diagnosis not present

## 2018-10-24 DIAGNOSIS — Z79899 Other long term (current) drug therapy: Secondary | ICD-10-CM | POA: Diagnosis not present

## 2018-10-24 DIAGNOSIS — B373 Candidiasis of vulva and vagina: Secondary | ICD-10-CM | POA: Diagnosis not present

## 2018-10-24 DIAGNOSIS — Z3A33 33 weeks gestation of pregnancy: Secondary | ICD-10-CM | POA: Diagnosis not present

## 2018-10-25 DIAGNOSIS — O9989 Other specified diseases and conditions complicating pregnancy, childbirth and the puerperium: Secondary | ICD-10-CM | POA: Diagnosis not present

## 2018-10-25 DIAGNOSIS — Z3A33 33 weeks gestation of pregnancy: Secondary | ICD-10-CM | POA: Diagnosis not present

## 2018-10-25 DIAGNOSIS — J45909 Unspecified asthma, uncomplicated: Secondary | ICD-10-CM | POA: Diagnosis not present

## 2018-10-26 DIAGNOSIS — Z3A33 33 weeks gestation of pregnancy: Secondary | ICD-10-CM | POA: Diagnosis not present

## 2018-11-10 DIAGNOSIS — D509 Iron deficiency anemia, unspecified: Secondary | ICD-10-CM | POA: Diagnosis not present

## 2018-11-10 DIAGNOSIS — Z9889 Other specified postprocedural states: Secondary | ICD-10-CM | POA: Diagnosis not present

## 2018-11-10 DIAGNOSIS — O0991 Supervision of high risk pregnancy, unspecified, first trimester: Secondary | ICD-10-CM | POA: Diagnosis not present

## 2018-11-10 DIAGNOSIS — B009 Herpesviral infection, unspecified: Secondary | ICD-10-CM | POA: Diagnosis not present

## 2018-11-10 DIAGNOSIS — J4520 Mild intermittent asthma, uncomplicated: Secondary | ICD-10-CM | POA: Diagnosis not present

## 2018-11-10 DIAGNOSIS — E663 Overweight: Secondary | ICD-10-CM | POA: Diagnosis not present

## 2018-11-10 DIAGNOSIS — Z8759 Personal history of other complications of pregnancy, childbirth and the puerperium: Secondary | ICD-10-CM | POA: Diagnosis not present

## 2018-11-10 DIAGNOSIS — R51 Headache: Secondary | ICD-10-CM | POA: Diagnosis not present

## 2018-11-10 DIAGNOSIS — O4703 False labor before 37 completed weeks of gestation, third trimester: Secondary | ICD-10-CM | POA: Diagnosis not present

## 2018-11-10 DIAGNOSIS — N898 Other specified noninflammatory disorders of vagina: Secondary | ICD-10-CM | POA: Diagnosis not present

## 2018-11-24 DIAGNOSIS — J45909 Unspecified asthma, uncomplicated: Secondary | ICD-10-CM | POA: Diagnosis not present

## 2018-11-26 DIAGNOSIS — Z7951 Long term (current) use of inhaled steroids: Secondary | ICD-10-CM | POA: Diagnosis not present

## 2018-11-26 DIAGNOSIS — O34211 Maternal care for low transverse scar from previous cesarean delivery: Secondary | ICD-10-CM | POA: Diagnosis not present

## 2018-11-26 DIAGNOSIS — Z882 Allergy status to sulfonamides status: Secondary | ICD-10-CM | POA: Diagnosis not present

## 2018-11-26 DIAGNOSIS — Z3A38 38 weeks gestation of pregnancy: Secondary | ICD-10-CM | POA: Diagnosis not present

## 2018-11-26 DIAGNOSIS — B009 Herpesviral infection, unspecified: Secondary | ICD-10-CM | POA: Diagnosis not present

## 2018-11-26 DIAGNOSIS — O2623 Pregnancy care for patient with recurrent pregnancy loss, third trimester: Secondary | ICD-10-CM | POA: Diagnosis not present

## 2018-11-26 DIAGNOSIS — O99013 Anemia complicating pregnancy, third trimester: Secondary | ICD-10-CM | POA: Diagnosis not present

## 2018-11-26 DIAGNOSIS — Z885 Allergy status to narcotic agent status: Secondary | ICD-10-CM | POA: Diagnosis not present

## 2018-11-26 DIAGNOSIS — O98313 Other infections with a predominantly sexual mode of transmission complicating pregnancy, third trimester: Secondary | ICD-10-CM | POA: Diagnosis not present

## 2018-11-26 DIAGNOSIS — O9989 Other specified diseases and conditions complicating pregnancy, childbirth and the puerperium: Secondary | ICD-10-CM | POA: Diagnosis not present

## 2018-11-26 DIAGNOSIS — O99513 Diseases of the respiratory system complicating pregnancy, third trimester: Secondary | ICD-10-CM | POA: Diagnosis not present

## 2018-11-26 DIAGNOSIS — O98513 Other viral diseases complicating pregnancy, third trimester: Secondary | ICD-10-CM | POA: Diagnosis not present

## 2018-11-26 DIAGNOSIS — Z79899 Other long term (current) drug therapy: Secondary | ICD-10-CM | POA: Diagnosis not present

## 2018-11-26 DIAGNOSIS — J4520 Mild intermittent asthma, uncomplicated: Secondary | ICD-10-CM | POA: Diagnosis not present

## 2018-11-26 DIAGNOSIS — O471 False labor at or after 37 completed weeks of gestation: Secondary | ICD-10-CM | POA: Diagnosis not present

## 2018-12-02 DIAGNOSIS — Z882 Allergy status to sulfonamides status: Secondary | ICD-10-CM | POA: Diagnosis not present

## 2018-12-02 DIAGNOSIS — D649 Anemia, unspecified: Secondary | ICD-10-CM | POA: Diagnosis not present

## 2018-12-02 DIAGNOSIS — O9902 Anemia complicating childbirth: Secondary | ICD-10-CM | POA: Diagnosis not present

## 2018-12-02 DIAGNOSIS — O34211 Maternal care for low transverse scar from previous cesarean delivery: Secondary | ICD-10-CM | POA: Diagnosis not present

## 2018-12-02 DIAGNOSIS — O34219 Maternal care for unspecified type scar from previous cesarean delivery: Secondary | ICD-10-CM | POA: Diagnosis not present

## 2018-12-02 DIAGNOSIS — J45901 Unspecified asthma with (acute) exacerbation: Secondary | ICD-10-CM | POA: Diagnosis not present

## 2018-12-02 DIAGNOSIS — R7309 Other abnormal glucose: Secondary | ICD-10-CM | POA: Diagnosis not present

## 2018-12-02 DIAGNOSIS — O9952 Diseases of the respiratory system complicating childbirth: Secondary | ICD-10-CM | POA: Diagnosis not present

## 2018-12-02 DIAGNOSIS — O9832 Other infections with a predominantly sexual mode of transmission complicating childbirth: Secondary | ICD-10-CM | POA: Diagnosis not present

## 2018-12-02 DIAGNOSIS — Z885 Allergy status to narcotic agent status: Secondary | ICD-10-CM | POA: Diagnosis not present

## 2018-12-02 DIAGNOSIS — Z8619 Personal history of other infectious and parasitic diseases: Secondary | ICD-10-CM | POA: Diagnosis not present

## 2018-12-02 DIAGNOSIS — Z85828 Personal history of other malignant neoplasm of skin: Secondary | ICD-10-CM | POA: Diagnosis not present

## 2018-12-02 DIAGNOSIS — A6000 Herpesviral infection of urogenital system, unspecified: Secondary | ICD-10-CM | POA: Diagnosis not present

## 2018-12-02 DIAGNOSIS — Z3A39 39 weeks gestation of pregnancy: Secondary | ICD-10-CM | POA: Diagnosis not present

## 2018-12-03 DIAGNOSIS — Z8619 Personal history of other infectious and parasitic diseases: Secondary | ICD-10-CM | POA: Diagnosis not present

## 2018-12-03 DIAGNOSIS — R7309 Other abnormal glucose: Secondary | ICD-10-CM | POA: Diagnosis not present

## 2018-12-03 DIAGNOSIS — O34219 Maternal care for unspecified type scar from previous cesarean delivery: Secondary | ICD-10-CM | POA: Diagnosis not present

## 2018-12-04 DIAGNOSIS — O34211 Maternal care for low transverse scar from previous cesarean delivery: Secondary | ICD-10-CM | POA: Diagnosis not present

## 2018-12-04 MED ORDER — SIMETHICONE 80 MG PO CHEW
80.00 | CHEWABLE_TABLET | ORAL | Status: DC
Start: ? — End: 2018-12-04

## 2018-12-04 MED ORDER — ONDANSETRON 4 MG PO TBDP
4.00 | ORAL_TABLET | ORAL | Status: DC
Start: ? — End: 2018-12-04

## 2018-12-04 MED ORDER — ACETAMINOPHEN 325 MG PO TABS
650.00 | ORAL_TABLET | ORAL | Status: DC
Start: ? — End: 2018-12-04

## 2018-12-04 MED ORDER — GENERIC EXTERNAL MEDICATION
Status: DC
Start: ? — End: 2018-12-04

## 2018-12-04 MED ORDER — DIBUCAINE 1 % EX OINT
1.00 | TOPICAL_OINTMENT | CUTANEOUS | Status: DC
Start: ? — End: 2018-12-04

## 2018-12-04 MED ORDER — FERROUS SULFATE 325 (65 FE) MG PO TABS
325.00 | ORAL_TABLET | ORAL | Status: DC
Start: ? — End: 2018-12-04

## 2018-12-04 MED ORDER — PROMETHAZINE HCL (BULK CHEMICALS - P'S)
25.00 | Status: DC
Start: ? — End: 2018-12-04

## 2018-12-04 MED ORDER — PNV PRENATAL PLUS MULTIVITAMIN 27-1 MG PO TABS
1.00 | ORAL_TABLET | ORAL | Status: DC
Start: 2018-12-05 — End: 2018-12-04

## 2018-12-04 MED ORDER — GLUCOSAMINE-CHONDROIT-COLLAGEN PO
100.00 | ORAL | Status: DC
Start: 2018-12-04 — End: 2018-12-04

## 2018-12-04 MED ORDER — IBUPROFEN 600 MG PO TABS
600.00 | ORAL_TABLET | ORAL | Status: DC
Start: 2018-12-04 — End: 2018-12-04

## 2019-01-26 DIAGNOSIS — N3946 Mixed incontinence: Secondary | ICD-10-CM | POA: Diagnosis not present

## 2019-02-03 ENCOUNTER — Ambulatory Visit: Payer: 59 | Admitting: Physician Assistant

## 2019-02-03 ENCOUNTER — Ambulatory Visit (INDEPENDENT_AMBULATORY_CARE_PROVIDER_SITE_OTHER): Payer: BC Managed Care – PPO | Admitting: Physician Assistant

## 2019-02-03 ENCOUNTER — Other Ambulatory Visit: Payer: Self-pay

## 2019-02-03 VITALS — BP 124/90 | HR 75 | Temp 98.8°F | Resp 16 | Ht 62.0 in | Wt 161.2 lb

## 2019-02-03 DIAGNOSIS — F53 Postpartum depression: Secondary | ICD-10-CM

## 2019-02-03 DIAGNOSIS — F329 Major depressive disorder, single episode, unspecified: Secondary | ICD-10-CM | POA: Diagnosis not present

## 2019-02-03 DIAGNOSIS — O139 Gestational [pregnancy-induced] hypertension without significant proteinuria, unspecified trimester: Secondary | ICD-10-CM | POA: Diagnosis not present

## 2019-02-03 DIAGNOSIS — O99345 Other mental disorders complicating the puerperium: Secondary | ICD-10-CM | POA: Diagnosis not present

## 2019-02-03 DIAGNOSIS — F32A Depression, unspecified: Secondary | ICD-10-CM

## 2019-02-03 DIAGNOSIS — N3946 Mixed incontinence: Secondary | ICD-10-CM | POA: Diagnosis not present

## 2019-02-03 MED ORDER — SERTRALINE HCL 100 MG PO TABS: ORAL_TABLET | ORAL | 0 refills | Status: DC

## 2019-02-03 NOTE — Patient Instructions (Signed)

## 2019-02-03 NOTE — Progress Notes (Signed)
Patient: Melanie FinlayKarleen Valdez Female    DOB: 02/20/1991   28 y.o.   MRN: [id number] Visit Date: 02/05/2019  Today's Provider: Trey SailorsAdriana M Asianna Brundage, PA-C   Chief Complaint  Patient presents with  . Depression   Subjective:     HPI  Patient here today to follow up on her postpartum depression and anxiety. Delivered at the end of April. Was started on 12.5 mg zoloft daily by CNM and then increased to 25 mg daily. Patient is taking Zoloft 25 mg daily, started medication last week. Husband went back to work recently. She has a 4219 month old as well. Denies thoughts of self harm or harming her child.    Gestational HTN: first child, then after she had child. Never had blood pressure issues previously.   BP Readings from Last 3 Encounters:  02/03/19 124/90  04/04/18 108/72     Allergies  Allergen Reactions  . Codeine   . Sulfa Antibiotics   . Hydrocodone-Acetaminophen Itching    Other reaction(s): mild rash/itching     Current Outpatient Medications:  .  albuterol (VENTOLIN HFA) 108 (90 Base) MCG/ACT inhaler, , Disp: , Rfl:  .  Cetirizine HCl (ZYRTEC ALLERGY) 10 MG CAPS, Zyrtec, Disp: , Rfl:  .  docusate sodium (COLACE) 100 MG capsule, Take 100 mg by mouth daily as needed for mild constipation., Disp: , Rfl:  .  fexofenadine (ALLEGRA) 180 MG tablet, Take 180 mg by mouth daily., Disp: , Rfl:  .  fluticasone (FLONASE) 50 MCG/ACT nasal spray, Place 2 sprays into both nostrils daily., Disp: , Rfl:  .  levalbuterol (XOPENEX) 0.31 MG/3ML nebulizer solution, Inhale into the lungs., Disp: , Rfl:  .  Prenatal Vit-Fe Fumarate-FA (PRENATAL 1+1 PO), Prenatal, Disp: , Rfl:  .  Prenatal Vit-Fe Fumarate-FA (PRENATAL VITAMIN PO), Take by mouth daily., Disp: , Rfl:  .  sertraline (ZOLOFT) 100 MG tablet, Take 50 mg daily x 4 weeks. Then, may increase to 100 mg daily., Disp: 90 tablet, Rfl: 0  Review of Systems  Constitutional: Negative.   Cardiovascular: Negative.   Psychiatric/Behavioral:  Negative for agitation, behavioral problems, confusion, decreased concentration, dysphoric mood, hallucinations, self-injury, sleep disturbance and suicidal ideas. The patient is not nervous/anxious and is not hyperactive.     Social History   Tobacco Use  . Smoking status: Never Smoker  . Smokeless tobacco: Never Used  Substance Use Topics  . Alcohol use: Yes    Alcohol/week: 1.0 standard drinks    Types: 1 Glasses of wine per week      Objective:   BP 124/90 (BP Location: Left Arm, Patient Position: Sitting, Cuff Size: Normal)   Pulse 75   Temp 98.8 F (37.1 C) (Oral)   Resp 16   Ht 5\' 2"  (1.575 m)   Wt 161 lb 3.2 oz (73.1 kg)   BMI 29.48 kg/m  Vitals:   02/03/19 1433  BP: 124/90  Pulse: 75  Resp: 16  Temp: 98.8 F (37.1 C)  TempSrc: Oral  Weight: 161 lb 3.2 oz (73.1 kg)  Height: 5\' 2"  (1.575 m)    Depression screen [facility] 02/03/2019  Decreased Interest 1  Down, Depressed, Hopeless 1  PHQ - 2 Score 2  Altered sleeping 0  Tired, decreased energy 2  Change in appetite 0  Feeling bad or failure about yourself  0  Trouble concentrating 0  Moving slowly or fidgety/restless 0  Suicidal thoughts 0  PHQ-9 Score 4  Difficult doing work/chores Somewhat  difficult    Physical Exam Constitutional:      Appearance: Normal appearance.  Cardiovascular:     Rate and Rhythm: Normal rate and regular rhythm.     Heart sounds: Normal heart sounds.  Pulmonary:     Effort: Pulmonary effort is normal.     Breath sounds: Normal breath sounds.  Skin:    General: Skin is warm and dry.  Neurological:     Mental Status: She is alert and oriented to person, place, and time. Mental status is at baseline.  Psychiatric:        Mood and Affect: Mood normal.        Behavior: Behavior normal.      No results found for any visits on 02/03/19.     Assessment & Plan    1. Depression, unspecified depression type  Titrate as below. Follow up 2 months. Contact us if she would  like to talk with counselor.   2. Post partum depression  - sertraline (ZOLOFT) 100 MG tablet; Take 50 mg daily x 4 weeks. Then, may increase to 100 mg daily.  Dispense: 90 tablet; Refill: 0  3. Gestational HTN  Continue to monitor BP.  The entirety of the information documented in the History of Present Illness, Review of Systems and Physical Exam were personally obtained by me. Portions of this information were initially documented by Lynford Humphrey, CMA and reviewed by me for thoroughness and accuracy.      Trinna Post, PA-C  [facility]

## 2019-02-26 DIAGNOSIS — Z20828 Contact with and (suspected) exposure to other viral communicable diseases: Secondary | ICD-10-CM | POA: Diagnosis not present

## 2019-02-26 DIAGNOSIS — Z882 Allergy status to sulfonamides status: Secondary | ICD-10-CM | POA: Diagnosis not present

## 2019-02-26 DIAGNOSIS — Z7951 Long term (current) use of inhaled steroids: Secondary | ICD-10-CM | POA: Diagnosis not present

## 2019-02-26 DIAGNOSIS — R0602 Shortness of breath: Secondary | ICD-10-CM | POA: Diagnosis not present

## 2019-02-26 DIAGNOSIS — Z885 Allergy status to narcotic agent status: Secondary | ICD-10-CM | POA: Diagnosis not present

## 2019-02-26 DIAGNOSIS — J45901 Unspecified asthma with (acute) exacerbation: Secondary | ICD-10-CM | POA: Diagnosis not present

## 2019-02-26 DIAGNOSIS — Z85828 Personal history of other malignant neoplasm of skin: Secondary | ICD-10-CM | POA: Diagnosis not present

## 2019-02-26 DIAGNOSIS — B349 Viral infection, unspecified: Secondary | ICD-10-CM | POA: Diagnosis not present

## 2019-02-26 DIAGNOSIS — R0600 Dyspnea, unspecified: Secondary | ICD-10-CM | POA: Diagnosis not present

## 2019-02-26 DIAGNOSIS — R509 Fever, unspecified: Secondary | ICD-10-CM | POA: Diagnosis not present

## 2019-03-30 ENCOUNTER — Encounter: Payer: Self-pay | Admitting: Physician Assistant

## 2019-04-03 DIAGNOSIS — H9190 Unspecified hearing loss, unspecified ear: Secondary | ICD-10-CM | POA: Diagnosis not present

## 2019-04-03 DIAGNOSIS — Z6830 Body mass index (BMI) 30.0-30.9, adult: Secondary | ICD-10-CM | POA: Diagnosis not present

## 2019-04-03 DIAGNOSIS — S0993XD Unspecified injury of face, subsequent encounter: Secondary | ICD-10-CM | POA: Diagnosis not present

## 2019-04-06 ENCOUNTER — Telehealth: Payer: Self-pay | Admitting: Physician Assistant

## 2019-04-07 ENCOUNTER — Ambulatory Visit (INDEPENDENT_AMBULATORY_CARE_PROVIDER_SITE_OTHER): Payer: BC Managed Care – PPO | Admitting: Physician Assistant

## 2019-04-07 ENCOUNTER — Encounter: Payer: Self-pay | Admitting: Physician Assistant

## 2019-04-07 ENCOUNTER — Other Ambulatory Visit: Payer: Self-pay

## 2019-04-07 VITALS — BP 131/87 | HR 85 | Temp 97.3°F | Resp 16 | Wt 159.0 lb

## 2019-04-07 DIAGNOSIS — R002 Palpitations: Secondary | ICD-10-CM

## 2019-04-07 DIAGNOSIS — F329 Major depressive disorder, single episode, unspecified: Secondary | ICD-10-CM

## 2019-04-07 DIAGNOSIS — F32A Depression, unspecified: Secondary | ICD-10-CM

## 2019-04-07 NOTE — Patient Instructions (Signed)
Palpitations Palpitations are feelings that your heartbeat is not normal. Your heartbeat may feel like it is:  Uneven.  Faster than normal.  Fluttering.  Skipping a beat. This is usually not a serious problem. In some cases, you may need tests to rule out any serious problems. Follow these instructions at home: Pay attention to any changes in your condition. Take these actions to help manage your symptoms: Eating and drinking  Avoid: ? Coffee, tea, soft drinks, and energy drinks. ? Chocolate. ? Alcohol. ? Diet pills. Lifestyle   Try to lower your stress. These things can help you relax: ? Yoga. ? Deep breathing and meditation. ? Exercise. ? Using words and images to create positive thoughts (guided imagery). ? Using your mind to control things in your body (biofeedback).  Do not use drugs.  Get plenty of rest and sleep. Keep a regular bed time. General instructions   Take over-the-counter and prescription medicines only as told by your doctor.  Do not use any products that contain nicotine or tobacco, such as cigarettes and e-cigarettes. If you need help quitting, ask your doctor.  Keep all follow-up visits as told by your doctor. This is important. You may need more tests if palpitations do not go away or get worse. Contact a doctor if:  Your symptoms last more than 24 hours.  Your symptoms occur more often. Get help right away if you:  Have chest pain.  Feel short of breath.  Have a very bad headache.  Feel dizzy.  Pass out (faint). Summary  Palpitations are feelings that your heartbeat is uneven or faster than normal. It may feel like your heart is fluttering or skipping a beat.  Avoid food and drinks that may cause palpitations. These include caffeine, chocolate, and alcohol.  Try to lower your stress. Do not smoke or use drugs.  Get help right away if you faint or have chest pain, shortness of breath, a severe headache, or dizziness. This  information is not intended to replace advice given to you by your health care provider. Make sure you discuss any questions you have with your health care provider. Document Released: 05/08/2008 Document Revised: 09/11/2017 Document Reviewed: 09/11/2017 Elsevier Patient Education  2020 Elsevier Inc.  

## 2019-04-07 NOTE — Progress Notes (Addendum)
Patient: Melanie Valdez Female    DOB: 08/11/1991   28 y.o.   MRN: [id number] Visit Date: 04/07/2019  Today's Provider: Trey SailorsAdriana M Dodge Ator, PA-C   Chief Complaint  Patient presents with  . Follow-up  . Depression   Subjective:     HPI   Depression, unspecified depression type From 02/03/2019-Titrate as below. Follow up 2 months. Contact us if she would like to talk with counselor. sertraline (ZOLOFT) 100 MG tablet; Take 50 mg daily x 4 weeks. Then, may increase to 100 mg daily.   Patient states she medication is working well. Patient believes she is having sides effects from medication, like heart beat skipping and night sweats.   She has been having this issue for a couple of months. [facility]. Her EKG showed normal sinus rhythm with occasional premature ventricular beats but otherwise normal. COVID testing was negative. CXR was negative. She was treated for an asthma exacerbation.   She reports these symptoms continue. She continues to have palpitations and sensation that her heart is skipping a beat. She denies history of heavy lifting. She is not short of breath, denies heartburn. She felt light headed but did not pass out a few days ago. She is not having issues exercising and has never had any in the past.   Allergies  Allergen Reactions  . Codeine   . Sulfa Antibiotics   . Hydrocodone-Acetaminophen Itching    Other reaction(s): mild rash/itching     Current Outpatient Medications:  .  albuterol (VENTOLIN HFA) 108 (90 Base) MCG/ACT inhaler, , Disp: , Rfl:  .  Cetirizine HCl (ZYRTEC ALLERGY) 10 MG CAPS, Zyrtec, Disp: , Rfl:  .  fexofenadine (ALLEGRA) 180 MG tablet, Take 180 mg by mouth daily., Disp: , Rfl:  .  fluticasone (FLONASE) 50 MCG/ACT nasal spray, Place 2 sprays into both nostrils daily., Disp: , Rfl:  .  levalbuterol (XOPENEX) 0.31 MG/3ML nebulizer solution, Inhale into the lungs., Disp: , Rfl:  .  Prenatal Vit-Fe  Fumarate-FA (PRENATAL 1+1 PO), Prenatal, Disp: , Rfl:  .  Prenatal Vit-Fe Fumarate-FA (PRENATAL VITAMIN PO), Take by mouth daily., Disp: , Rfl:  .  sertraline (ZOLOFT) 100 MG tablet, Take 50 mg daily x 4 weeks. Then, may increase to 100 mg daily., Disp: 90 tablet, Rfl: 0 .  docusate sodium (COLACE) 100 MG capsule, Take 100 mg by mouth daily as needed for mild constipation., Disp: , Rfl:   Review of Systems  Constitutional: Negative for appetite change, chills, fatigue and fever.  Respiratory: Negative for chest tightness and shortness of breath.   Cardiovascular: Negative for chest pain and palpitations.  Gastrointestinal: Negative for abdominal pain, nausea and vomiting.  Neurological: Negative for dizziness and weakness.    Social History   Tobacco Use  . Smoking status: Never Smoker  . Smokeless tobacco: Never Used  Substance Use Topics  . Alcohol use: Yes    Alcohol/week: 1.0 standard drinks    Types: 1 Glasses of wine per week      Objective:   BP 131/87 (BP Location: Left Arm, Patient Position: Sitting, Cuff Size: Large)   Pulse 85   Temp (!) 97.3 F (36.3 C) (Oral)   Resp 16   Wt 159 lb (72.1 kg)   SpO2 98%   BMI 29.08 kg/m  Vitals:   04/07/19 1511  BP: 131/87  Pulse: 85  Resp: 16  Temp: (!) 97.3 F (36.3 C)  TempSrc: Oral  SpO2: 98%  Weight: 159 lb (72.1 kg)     Physical Exam Cardiovascular:     Rate and Rhythm: Normal rate. Rhythm irregular.     Pulses: Normal pulses.     Heart sounds: Normal heart sounds.     Comments: Occasional irregular beat.  Pulmonary:     Effort: Pulmonary effort is normal.     Breath sounds: Normal breath sounds.  Skin:    General: Skin is warm and dry.  Neurological:     Mental Status: She is oriented to person, place, and time. Mental status is at baseline.  Psychiatric:        Mood and Affect: Mood normal.        Behavior: Behavior normal.      No results found for any visits on 04/07/19.     Assessment & Plan     1. Depression, unspecified depression type  Improved, continue zoloft. I do not think her palpitations are coming from zoloft but she could trial a 50 mg dose to see if they improve.   2. Palpitations  EKG shows normal sinus rhythm today but did hear some irregular beats during auscultation. EKG in ER showed PVCs. She has not passed out, no signs of ischemia on EKG, she appears well in office today. Would recommend seeing cardiology for Holter monitoring.   - EKG 12-Lead - Ambulatory referral to Cardiology  The entirety of the information documented in the History of Present Illness, Review of Systems and Physical Exam were personally obtained by me. Portions of this information were initially documented by April M. Sabra Heck, CMA and reviewed by me for thoroughness and accuracy.   I have spent 25 minutes with patient, >50% of which was spent on counseling and coordination of care.     Trinna Post, PA-C  [facility]

## 2019-04-09 ENCOUNTER — Ambulatory Visit: Payer: Self-pay | Admitting: Physician Assistant

## 2019-04-21 ENCOUNTER — Telehealth: Payer: BC Managed Care – PPO | Admitting: Physician Assistant

## 2019-04-21 DIAGNOSIS — M5441 Lumbago with sciatica, right side: Secondary | ICD-10-CM

## 2019-04-21 NOTE — Progress Notes (Signed)
Based on what you shared with me, I feel your condition warrants further evaluation and I recommend that you be seen for a face to face office visit.  Diarrhea is not a common symptom of musculoskeletal back pain. Pain under the ribs on the right side with diarrhea could indicate a gall bladder problem or possibly a problem with the intestines causing pain and diarrhea. I cannot reliably make that determination with a virtual visit, and recommend following up with your PCP or an urgent care or emergency department today for evaluation of your symptoms.   NOTE: If you entered your credit card information for this eVisit, you will not be charged. You may see a "hold" on your card for the $35 but that hold will drop off and you will not have a charge processed.  If you are having a true medical emergency please call 911.     For an urgent face to face visit, [place] has four urgent care centers for your convenience:   . [facility]    [phone]                  Get Driving Directions  [address], [place] [address] . 10 am to 8 pm Monday-Friday . 12 pm to 8 pm Saturday-Sunday   . [facility] [place]                  Get Driving Directions  [address], [place], [place] [address] . 8 am to 8 pm Monday-Friday . 9 am to 6 pm Saturday . 11 am to 6 pm Sunday   . [facility] [place]                  Get Driving Directions   [address], [place] [address] . 8 am to 8 pm Monday-Friday . 8 am to 4 pm Saturday-Sunday    . [facility] [place]                    Get Driving Directions  [phone]  [address], [place], [place] [address]  . Monday-Friday, 12 PM to 6 PM    Your e-visit answers were reviewed by a board certified advanced clinical practitioner to complete your personal care plan.  Thank you  for using e-Visits.  Greater than 5 minutes, yet less than 10 minutes of time have been spent researching, coordinating, and implementing care for this patient today.

## 2019-04-22 NOTE — Progress Notes (Signed)
Patient: Melanie Valdez Female    DOB: 20-Aug-1990   28 y.o.   MRN: [id number] Visit Date: 04/23/2019  Today's Provider: Trinna Post, PA-C   Chief Complaint  Patient presents with  . Diarrhea   Subjective:     Flank Pain This is a new problem. The current episode started in the past 7 days. The problem occurs constantly. The problem has been gradually worsening since onset. The quality of the pain is described as aching, burning and cramping. The pain radiates to the right thigh. The pain is at a severity of 5/10. The pain is mild. The pain is the same all the time. Pertinent negatives include no abdominal pain, chest pain, dysuria, fever or weakness. The treatment provided no relief.  Patient reports diarrhea for the fast few days. Patient reports stools are very watery and happening at least five times a day. Five times daily for one week. Started gradually. Loose watery, not formed at all. Denies dysuria. Denies abdominal pain. Reports getting worse. No family history of crohns disease or ulcerative colitis. Denies fevers, chills, nausea, vomiting, blood or mucous in stool. Denies pelvic pain and vaginal bleeding.   Wt Readings from Last 3 Encounters:  04/23/19 160 lb (72.6 kg)  04/07/19 159 lb (72.1 kg)  02/03/19 161 lb 3.2 oz (73.1 kg)     Allergies  Allergen Reactions  . Codeine   . Sulfa Antibiotics   . Hydrocodone-Acetaminophen Itching    Other reaction(s): mild rash/itching     Current Outpatient Medications:  .  albuterol (VENTOLIN HFA) 108 (90 Base) MCG/ACT inhaler, , Disp: , Rfl:  .  Cetirizine HCl (ZYRTEC ALLERGY) 10 MG CAPS, Zyrtec, Disp: , Rfl:  .  fexofenadine (ALLEGRA) 180 MG tablet, Take 180 mg by mouth daily., Disp: , Rfl:  .  fluticasone (FLONASE) 50 MCG/ACT nasal spray, Place 2 sprays into both nostrils daily., Disp: , Rfl:  .  levalbuterol (XOPENEX) 0.31 MG/3ML nebulizer solution, Inhale into the lungs., Disp: , Rfl:  .  sertraline (ZOLOFT) 100  MG tablet, Take 50 mg daily x 4 weeks. Then, may increase to 100 mg daily., Disp: 90 tablet, Rfl: 0  Review of Systems  Constitutional: Positive for unexpected weight change. Negative for appetite change, chills, fatigue and fever.  Respiratory: Negative for chest tightness and shortness of breath.   Cardiovascular: Negative for chest pain and palpitations.  Gastrointestinal: Positive for abdominal distention and diarrhea. Negative for abdominal pain, nausea and vomiting.  Genitourinary: Positive for flank pain. Negative for dysuria.  Neurological: Negative for dizziness and weakness.    Social History   Tobacco Use  . Smoking status: Never Smoker  . Smokeless tobacco: Never Used  Substance Use Topics  . Alcohol use: Yes    Alcohol/week: 1.0 standard drinks    Types: 1 Glasses of wine per week      Objective:   BP 118/80 (BP Location: Right Arm, Patient Position: Sitting, Cuff Size: Normal)   Pulse 64   Temp (!) 97.3 F (36.3 C) (Temporal)   Resp 16   Ht 5\' 2"  (1.575 m)   Wt 160 lb (72.6 kg)   BMI 29.26 kg/m  Vitals:   04/23/19 0828  BP: 118/80  Pulse: 64  Resp: 16  Temp: (!) 97.3 F (36.3 C)  TempSrc: Temporal  Weight: 160 lb (72.6 kg)  Height: 5\' 2"  (1.575 m)  Body mass index is 29.26 kg/m.   Physical Exam Constitutional:  Appearance: Normal appearance.  Cardiovascular:     Rate and Rhythm: Normal rate and regular rhythm.     Heart sounds: Normal heart sounds.  Pulmonary:     Effort: Pulmonary effort is normal.     Breath sounds: Normal breath sounds.  Abdominal:     General: Bowel sounds are normal.     Palpations: Abdomen is soft.     Tenderness: There is abdominal tenderness.       Comments: Mild tenderness in RLQ  Skin:    General: Skin is warm and dry.  Neurological:     Mental Status: She is alert and oriented to person, place, and time. Mental status is at baseline.  Psychiatric:        Mood and Affect: Mood normal.        Behavior:  Behavior normal.      No results found for any visits on 04/23/19.     Assessment & Plan    1. Anxiety and depression  - escitalopram (LEXAPRO) 10 MG tablet; Take 1 tablet (10 mg total) by mouth daily.  Dispense: 90 tablet; Refill: 1  2. Diarrhea  No history of antibiotics in the past three months, no family history of IBD. Possibly viral vs. Bacterial gastroenteritis that will be self limiting. Could also be side effect from zoloft. Will switch to lexapro. She may use immodium and observe for another week. If persistent, can get stool sample and labwork.   The entirety of the information documented in the History of Present Illness, Review of Systems and Physical Exam were personally obtained by me. Portions of this information were initially documented by Rondel BatonSulibeya Dimas, CMA and reviewed by me for thoroughness and accuracy.      Trey SailorsAdriana M Pollak, PA-C  [facility]

## 2019-04-23 ENCOUNTER — Other Ambulatory Visit: Payer: Self-pay

## 2019-04-23 ENCOUNTER — Encounter: Payer: Self-pay | Admitting: Physician Assistant

## 2019-04-23 ENCOUNTER — Ambulatory Visit (INDEPENDENT_AMBULATORY_CARE_PROVIDER_SITE_OTHER): Payer: BC Managed Care – PPO | Admitting: Physician Assistant

## 2019-04-23 VITALS — BP 118/80 | HR 64 | Temp 97.3°F | Resp 16 | Ht 62.0 in | Wt 160.0 lb

## 2019-04-23 DIAGNOSIS — F419 Anxiety disorder, unspecified: Secondary | ICD-10-CM | POA: Diagnosis not present

## 2019-04-23 DIAGNOSIS — F32A Depression, unspecified: Secondary | ICD-10-CM

## 2019-04-23 DIAGNOSIS — F329 Major depressive disorder, single episode, unspecified: Secondary | ICD-10-CM | POA: Diagnosis not present

## 2019-04-23 DIAGNOSIS — R197 Diarrhea, unspecified: Secondary | ICD-10-CM | POA: Diagnosis not present

## 2019-04-23 MED ORDER — ESCITALOPRAM OXALATE 10 MG PO TABS: 10.0000 mg | ORAL_TABLET | Freq: Every day | ORAL | 1 refills | Status: DC

## 2019-04-23 NOTE — Patient Instructions (Signed)

## 2019-04-24 DIAGNOSIS — S0993XA Unspecified injury of face, initial encounter: Secondary | ICD-10-CM | POA: Diagnosis not present

## 2019-04-24 DIAGNOSIS — S0993XD Unspecified injury of face, subsequent encounter: Secondary | ICD-10-CM | POA: Diagnosis not present

## 2019-04-24 DIAGNOSIS — X58XXXD Exposure to other specified factors, subsequent encounter: Secondary | ICD-10-CM | POA: Diagnosis not present

## 2019-04-30 ENCOUNTER — Encounter: Payer: Self-pay | Admitting: Physician Assistant

## 2019-04-30 ENCOUNTER — Other Ambulatory Visit: Payer: Self-pay | Admitting: Physician Assistant

## 2019-04-30 DIAGNOSIS — R197 Diarrhea, unspecified: Secondary | ICD-10-CM

## 2019-05-01 ENCOUNTER — Encounter: Payer: Self-pay | Admitting: Physician Assistant

## 2019-05-01 ENCOUNTER — Ambulatory Visit (INDEPENDENT_AMBULATORY_CARE_PROVIDER_SITE_OTHER): Payer: BC Managed Care – PPO | Admitting: Cardiology

## 2019-05-01 ENCOUNTER — Other Ambulatory Visit: Payer: Self-pay

## 2019-05-01 ENCOUNTER — Ambulatory Visit (INDEPENDENT_AMBULATORY_CARE_PROVIDER_SITE_OTHER): Payer: BC Managed Care – PPO

## 2019-05-01 ENCOUNTER — Encounter: Payer: Self-pay | Admitting: Cardiology

## 2019-05-01 VITALS — BP 110/84 | HR 86 | Temp 97.9°F | Ht 62.0 in | Wt 162.2 lb

## 2019-05-01 DIAGNOSIS — R197 Diarrhea, unspecified: Secondary | ICD-10-CM | POA: Diagnosis not present

## 2019-05-01 DIAGNOSIS — R002 Palpitations: Secondary | ICD-10-CM

## 2019-05-01 NOTE — Patient Instructions (Signed)
Medication Instructions:  - Your physician recommends that you continue on your current medications as directed. Please refer to the Current Medication list given to you today.  If you need a refill on your cardiac medications before your next appointment, please call your pharmacy.   Lab work: - none ordered  If you have labs (blood work) drawn today and your tests are completely normal, you will receive your results only by: Marland Kitchen MyChart Message (if you have MyChart) OR . A paper copy in the mail If you have any lab test that is abnormal or we need to change your treatment, we will call you to review the results.  Testing/Procedures: - Your physician has recommended that you wear a 2 week Zio monitor (to be placed today). This monitor is a medical device that records the heart's electrical activity. Doctors most often use these monitors to diagnose arrhythmias. Arrhythmias are problems with the speed or rhythm of the heartbeat. The monitor is a small device applied to your chest. You can wear one while you do your normal daily activities. While wearing this monitor if you have any symptoms to push the button and record what you felt. Once you have worn this monitor for the period of time provider prescribed (Usually 14 days), you will return the monitor device in the postage paid box. Once it is returned they will download the data collected and provide [place] with a report which the provider will then review and we will call you with those results. Important tips:  1. Avoid showering during the first 24 hours of wearing the monitor. 2. Avoid excessive sweating to help maximize wear time. 3. Do not submerge the device, no hot tubs, and no swimming pools. 4. Keep any lotions or oils away from the patch. 5. After 24 hours you may shower with the patch on. Take brief showers with your back facing the shower head.  6. Do not remove patch once it has been placed because that will interrupt data and  decrease adhesive wear time. 7. Push the button when you have any symptoms and write down what you were feeling. 8. Once you have completed wearing your monitor, remove and place into box which has postage paid and place in your outgoing mailbox.  9. If for some reason you have misplaced your box then call our office and we can provide another box and/or mail it off for you.        Follow-Up: At [facility], you and your health needs are our priority.  As part of our continuing mission to provide you with exceptional heart care, we have created designated Provider Care Teams.  These Care Teams include your primary Cardiologist (physician) and Advanced Practice Providers (APPs -  Physician Assistants and Nurse Practitioners) who all work together to provide you with the care you need, when you need it. . in 4 weeks   Any Other Special Instructions Will Be Listed Below (If Applicable). - N/A

## 2019-05-01 NOTE — Progress Notes (Signed)
Cardiology Office Note:    Date:  05/01/2019   ID:  Melanie Valdez, DOB 07-11-1991, MRN [id number]  PCP:  Trinna Post, PA-C  Cardiologist:  Kate Sable, MD  Electrophysiologist:  None   Referring MD: Trinna Post, PA-C   Chief Complaint  Patient presents with  . New Patient (Initial Visit)    Palpitations    History of Present Illness:    Melanie Valdez is a 28 y.o. female with a hx of asthma, who presents due to a 31-month history of palpitations.  Patient states symptoms are not related to exertion.symptoms alcohol multiple times a day lasting for a couple of minutes.  She states feeling lightheaded and dizzy when the symptoms occur and occasionally short of breath.  She is a Doctor, hospital at [employer], she first noticed symptoms 2 months ago while at work where she felt her pulse was irregular with prolonged pauses between heartbeats.  She was taken to the ED where evaluation with an EKG did not reveal any pathology.    She denies chest pain with exertion, nausea, vomiting, diarrhea, orthopnea, edema.  History reviewed. No pertinent past medical history.  Past Surgical History:  Procedure Laterality Date  . BREAST REDUCTION SURGERY    . CESAREAN SECTION  2018  . LEEP  2015  . TONSILLECTOMY AND ADENOIDECTOMY  1999  . WISDOM TOOTH EXTRACTION  2009    Current Medications: Current Meds  Medication Sig  . albuterol (VENTOLIN HFA) 108 (90 Base) MCG/ACT inhaler Inhale 1-2 puffs into the lungs every 6 (six) hours as needed.   . Cetirizine HCl (ZYRTEC ALLERGY) 10 MG CAPS Take 10 mg by mouth daily.   Marland Kitchen escitalopram (LEXAPRO) 10 MG tablet Take 1 tablet (10 mg total) by mouth daily.  . fexofenadine (ALLEGRA) 180 MG tablet Take 180 mg by mouth daily.  . fluticasone (FLONASE) 50 MCG/ACT nasal spray Place 2 sprays into both nostrils daily.  Marland Kitchen levalbuterol (XOPENEX) 0.31 MG/3ML nebulizer solution Inhale into the lungs.     Allergies:   Codeine, Sulfa antibiotics, and  Hydrocodone-acetaminophen   Social History   Socioeconomic History  . Marital status: Married    Spouse name: Not on file  . Number of children: Not on file  . Years of education: Not on file  . Highest education level: Not on file  Occupational History  . Not on file  Social Needs  . Financial resource strain: Not on file  . Food insecurity    Worry: Not on file    Inability: Not on file  . Transportation needs    Medical: Not on file    Non-medical: Not on file  Tobacco Use  . Smoking status: Never Smoker  . Smokeless tobacco: Never Used  Substance and Sexual Activity  . Alcohol use: Yes    Alcohol/week: 1.0 standard drinks    Types: 1 Glasses of wine per week  . Drug use: Never  . Sexual activity: Not on file  Lifestyle  . Physical activity    Days per week: Not on file    Minutes per session: Not on file  . Stress: Not on file  Relationships  . Social Herbalist on phone: Not on file    Gets together: Not on file    Attends religious service: Not on file    Active member of club or organization: Not on file    Attends meetings of clubs or organizations: Not on file    Relationship  status: Not on file  Other Topics Concern  . Not on file  Social History Narrative  . Not on file     Family History: The patient's family history includes Heart disease in her maternal grandfather; Hypertension in her maternal grandmother; Kidney disease in her maternal grandmother.  ROS:   Please see the history of present illness.     All other systems reviewed and are negative.  EKGs/Labs/Other Studies Reviewed:    The following studies were reviewed today:   EKG:  EKG is  ordered today.  The ekg ordered today demonstrates normal sinus rhythm, normal ECG.  Recent Labs: No results found for requested labs within last 8760 hours.  Recent Lipid Panel    Component Value Date/Time   CHOL 233 (H) 04/04/2018 1443   TRIG 112 04/04/2018 1443   HDL 52 04/04/2018  1443   CHOLHDL 4.5 (H) 04/04/2018 1443   LDLCALC 159 (H) 04/04/2018 1443    Physical Exam:    VS:  BP 110/84 (BP Location: Right Arm, Patient Position: Sitting, Cuff Size: Normal)   Pulse 86   Temp 97.9 F (36.6 C)   Ht 5\' 2"  (1.575 m)   Wt 162 lb 4 oz (73.6 kg)   SpO2 98%   BMI 29.68 kg/m     Wt Readings from Last 3 Encounters:  05/01/19 162 lb 4 oz (73.6 kg)  04/23/19 160 lb (72.6 kg)  04/07/19 159 lb (72.1 kg)     GEN:  Well nourished, well developed in no acute distress HEENT: Normal NECK: No JVD; No carotid bruits LYMPHATICS: No lymphadenopathy CARDIAC: RRR, no murmurs, rubs, gallops RESPIRATORY:  Clear to auscultation without rales, wheezing or rhonchi  ABDOMEN: Soft, non-tender, non-distended MUSCULOSKELETAL:  No edema; No deformity  SKIN: Warm and dry NEUROLOGIC:  Alert and oriented x 3 PSYCHIATRIC:  Normal affect   ASSESSMENT:   ECG shows normal sinus rhythm, she is low cardiac risk for CAD, no murmurs noted on exam. 1. Palpitations    PLAN:    In order of problems listed above:  1. We will obtain ZIO Patch for 1 week.  Follow-up after ZIO Patch   Medication Adjustments/Labs and Tests Ordered: Current medicines are reviewed at length with the patient today.  Concerns regarding medicines are outlined above.  Orders Placed This Encounter  Procedures  . LONG TERM MONITOR (3-14 DAYS)  . EKG 12-Lead   No orders of the defined types were placed in this encounter.   Patient Instructions  Medication Instructions:  - Your physician recommends that you continue on your current medications as directed. Please refer to the Current Medication list given to you today.  If you need a refill on your cardiac medications before your next appointment, please call your pharmacy.   Lab work: - none ordered  If you have labs (blood work) drawn today and your tests are completely normal, you will receive your results only by: Marland Kitchen. MyChart Message (if you have  MyChart) OR . A paper copy in the mail If you have any lab test that is abnormal or we need to change your treatment, we will call you to review the results.  Testing/Procedures: - Your physician has recommended that you wear a 2 week Zio monitor (to be placed today). This monitor is a medical device that records the heart's electrical activity. Doctors most often use these monitors to diagnose arrhythmias. Arrhythmias are problems with the speed or rhythm of the heartbeat. The monitor is a small  device applied to your chest. You can wear one while you do your normal daily activities. While wearing this monitor if you have any symptoms to push the button and record what you felt. Once you have worn this monitor for the period of time provider prescribed (Usually 14 days), you will return the monitor device in the postage paid box. Once it is returned they will download the data collected and provide [place] with a report which the provider will then review and we will call you with those results. Important tips:  1. Avoid showering during the first 24 hours of wearing the monitor. 2. Avoid excessive sweating to help maximize wear time. 3. Do not submerge the device, no hot tubs, and no swimming pools. 4. Keep any lotions or oils away from the patch. 5. After 24 hours you may shower with the patch on. Take brief showers with your back facing the shower head.  6. Do not remove patch once it has been placed because that will interrupt data and decrease adhesive wear time. 7. Push the button when you have any symptoms and write down what you were feeling. 8. Once you have completed wearing your monitor, remove and place into box which has postage paid and place in your outgoing mailbox.  9. If for some reason you have misplaced your box then call our office and we can provide another box and/or mail it off for you.        Follow-Up: At [facility], you and your health needs are our priority.  As  part of our continuing mission to provide you with exceptional heart care, we have created designated Provider Care Teams.  These Care Teams include your primary Cardiologist (physician) and Advanced Practice Providers (APPs -  Physician Assistants and Nurse Practitioners) who all work together to provide you with the care you need, when you need it. . in 4 weeks   Any Other Special Instructions Will Be Listed Below (If Applicable). - N/A      Signed, Debbe Odea, MD  05/01/2019 11:12 AM    [facility]

## 2019-05-03 LAB — CBC WITH DIFFERENTIAL/PLATELET
Basophils Absolute: 0 10*3/uL (ref 0.0–0.2)
Basos: 1 %
EOS (ABSOLUTE): 0.2 10*3/uL (ref 0.0–0.4)
Eos: 3 %
Hematocrit: 37 % (ref 34.0–46.6)
Hemoglobin: 12.5 g/dL (ref 11.1–15.9)
Immature Grans (Abs): 0 10*3/uL (ref 0.0–0.1)
Immature Granulocytes: 0 %
Lymphocytes Absolute: 1.8 10*3/uL (ref 0.7–3.1)
Lymphs: 32 %
MCH: 27.6 pg (ref 26.6–33.0)
MCHC: 33.8 g/dL (ref 31.5–35.7)
MCV: 82 fL (ref 79–97)
Monocytes Absolute: 0.4 10*3/uL (ref 0.1–0.9)
Monocytes: 6 %
Neutrophils Absolute: 3.3 10*3/uL (ref 1.4–7.0)
Neutrophils: 58 %
Platelets: 169 10*3/uL (ref 150–450)
RBC: 4.53 x10E6/uL (ref 3.77–5.28)
RDW: 14.6 % (ref 11.7–15.4)
WBC: 5.7 10*3/uL (ref 3.4–10.8)

## 2019-05-03 LAB — COMPREHENSIVE METABOLIC PANEL
ALT: 23 IU/L (ref 0–32)
AST: 19 IU/L (ref 0–40)
Albumin/Globulin Ratio: 1.9 (ref 1.2–2.2)
Albumin: 4.5 g/dL (ref 3.9–5.0)
Alkaline Phosphatase: 73 IU/L (ref 39–117)
BUN/Creatinine Ratio: 12 (ref 9–23)
BUN: 11 mg/dL (ref 6–20)
Bilirubin Total: 1.2 mg/dL (ref 0.0–1.2)
CO2: 22 mmol/L (ref 20–29)
Calcium: 9.2 mg/dL (ref 8.7–10.2)
Chloride: 105 mmol/L (ref 96–106)
Creatinine, Ser: 0.93 mg/dL (ref 0.57–1.00)
GFR calc Af Amer: 96 mL/min/{1.73_m2} (ref >59)
GFR calc non Af Amer: 83 mL/min/{1.73_m2} (ref >59)
Globulin, Total: 2.4 g/dL (ref 1.5–4.5)
Glucose: 98 mg/dL (ref 65–99)
Potassium: 4.3 mmol/L (ref 3.5–5.2)
Sodium: 142 mmol/L (ref 134–144)
Total Protein: 6.9 g/dL (ref 6.0–8.5)

## 2019-05-03 LAB — IGA: IgA/Immunoglobulin A, Serum: 179 mg/dL (ref 87–352)

## 2019-05-03 LAB — TISSUE TRANSGLUTAMINASE, IGA: Transglutaminase IgA: <2 U/mL (ref 0–3)

## 2019-05-03 LAB — TSH: TSH: 2.93 u[IU]/mL (ref 0.450–4.500)

## 2019-05-03 LAB — SEDIMENTATION RATE: Sed Rate: 10 mm/hr (ref 0–32)

## 2019-05-03 LAB — C-REACTIVE PROTEIN: CRP: 4 mg/L (ref 0–10)

## 2019-05-06 LAB — GI PROFILE, STOOL, PCR

## 2019-05-23 ENCOUNTER — Ambulatory Visit
Admission: EM | Admit: 2019-05-23 | Discharge: 2019-05-23 | Disposition: A | Discharge status: Home / Self Care | Payer: BC Managed Care – PPO | Attending: Family Medicine | Admitting: Family Medicine

## 2019-05-23 ENCOUNTER — Encounter: Payer: Self-pay | Admitting: Emergency Medicine

## 2019-05-23 ENCOUNTER — Other Ambulatory Visit: Payer: Self-pay

## 2019-05-23 DIAGNOSIS — J029 Acute pharyngitis, unspecified: Secondary | ICD-10-CM

## 2019-05-23 DIAGNOSIS — J069 Acute upper respiratory infection, unspecified: Secondary | ICD-10-CM

## 2019-05-23 LAB — RAPID STREP SCREEN (MED CTR MEBANE ONLY): Streptococcus, Group A Screen (Direct): NEGATIVE

## 2019-05-23 MED ORDER — LIDOCAINE VISCOUS HCL 2 % MT SOLN: OROMUCOSAL | 0 refills | Status: DC

## 2019-05-23 NOTE — ED Provider Notes (Signed)
[facility]    CSN: [id number] Arrival date & time: 05/23/19  [id number]  History   Chief Complaint Chief Complaint  Patient presents with  . Sore Throat    APPT   HPI  28 year old female presents with sore throat.  Patient reports that her sore throat started yesterday.  Patient reports that her pain is moderate in severity, 5/10.  She states that her throat feels like it is "burning".  She reports some associated sinus pressure.  She states that her daughter has recently been sick with a UTI as well as a URI.  Daughter has tested negative for COVID-19.  Patient denies fever.  No relieving factors.  No other associated symptoms.  No other complaints or concerns at this time.  PMH, Surgical Hx, Family Hx, Social History reviewed and updated as below.  PMH: Asthma, Anemia, Allergies, Genital herpes  Past Surgical History:  Procedure Laterality Date  . BREAST REDUCTION SURGERY    . CESAREAN SECTION  2018  . LEEP  2015  . TONSILLECTOMY AND ADENOIDECTOMY  1999  . WISDOM TOOTH EXTRACTION  2009   OB History   No obstetric history on file.    Home Medications    Prior to Admission medications   Medication Sig Start Date End Date Taking? Authorizing Provider  albuterol (VENTOLIN HFA) 108 (90 Base) MCG/ACT inhaler Inhale 1-2 puffs into the lungs every 6 (six) hours as needed.  09/09/18  Yes [provider]  Cetirizine HCl (ZYRTEC ALLERGY) 10 MG CAPS Take 10 mg by mouth daily.    Yes [provider]  escitalopram (LEXAPRO) 10 MG tablet Take 1 tablet (10 mg total) by mouth daily. 04/23/19  Yes Trey SailorsPollak, Adriana M, PA-C  etonogestrel (NEXPLANON) 68 MG IMPL implant 1 each by Subdermal route once.   Yes [provider]  fexofenadine (ALLEGRA) 180 MG tablet Take 180 mg by mouth daily.   Yes [provider]  fluticasone (FLONASE) 50 MCG/ACT nasal spray Place 2 sprays into both nostrils daily.   Yes [provider]  levalbuterol (XOPENEX)  0.31 MG/3ML nebulizer solution Inhale into the lungs.    [provider]  lidocaine (XYLOCAINE) 2 % solution Gargle 15 mL every 3 hours as needed. May swallow if desired. 05/23/19   Tommie Samsook, Caspian Deleonardis G, DO    Family History Family History  Problem Relation Age of Onset  . Hypertension Maternal Grandmother   . Kidney disease Maternal Grandmother   . Heart disease Maternal Grandfather     Social History Social History   Tobacco Use  . Smoking status: Never Smoker  . Smokeless tobacco: Never Used  Substance Use Topics  . Alcohol use: Yes    Alcohol/week: 1.0 standard drinks    Types: 1 Glasses of wine per week  . Drug use: Never     Allergies   Codeine, Sulfa antibiotics, and Hydrocodone-acetaminophen   Review of Systems Review of Systems  Constitutional: Negative for fever.  HENT: Positive for sinus pressure and sore throat.   Respiratory: Negative for cough.    Physical Exam Triage Vital Signs ED Triage Vitals  Enc Vitals Group     BP 05/23/19 1018 120/80     Pulse Rate 05/23/19 1018 78     Resp 05/23/19 1018 14     Temp 05/23/19 1018 98.3 F (36.8 C)     Temp Source 05/23/19 1018 Oral     SpO2 05/23/19 1018 100 %     Weight 05/23/19 1013 158 lb (  71.7 kg)     Height 05/23/19 1013 5\' 2"  (1.575 m)     Head Circumference --      Peak Flow --      Pain Score 05/23/19 1013 5     Pain Loc --      Pain Edu? --      Excl. in [place]? --    Updated Vital Signs BP 120/80 (BP Location: Right Arm)   Pulse 78   Temp 98.3 F (36.8 C) (Oral)   Resp 14   Ht 5\' 2"  (1.575 m)   Wt 71.7 kg   LMP 05/18/2019 (Exact Date)   SpO2 100%   BMI 28.90 kg/m   Visual Acuity Right Eye Distance:   Left Eye Distance:   Bilateral Distance:    Right Eye Near:   Left Eye Near:    Bilateral Near:     Physical Exam Vitals signs and nursing note reviewed.  Constitutional:      General: She is not in acute distress.    Appearance: Normal appearance. She is not ill-appearing.   HENT:     Head: Normocephalic and atraumatic.     Left Ear: Tympanic membrane normal.     Ears:     Comments: Right TM with scarring.    Nose: Nose normal. No rhinorrhea.     Mouth/Throat:     Pharynx: Oropharynx is clear. No oropharyngeal exudate or posterior oropharyngeal erythema.  Eyes:     General:        Right eye: No discharge.        Left eye: No discharge.     Conjunctiva/sclera: Conjunctivae normal.  Cardiovascular:     Rate and Rhythm: Normal rate and regular rhythm.     Heart sounds: No murmur.  Pulmonary:     Effort: Pulmonary effort is normal.     Breath sounds: Normal breath sounds. No wheezing, rhonchi or rales.  Skin:    General: Skin is warm.     Findings: No rash.  Neurological:     Mental Status: She is alert.  Psychiatric:        Mood and Affect: Mood normal.        Behavior: Behavior normal.    UC Treatments / Results  Labs (all labs ordered are listed, but only abnormal results are displayed) Labs Reviewed  RAPID STREP SCREEN ([facility] ONLY)  NOVEL CORONAVIRUS, NAA (HOSP ORDER, SEND-OUT TO REF LAB; TAT 18-24 HRS)  CULTURE, GROUP A STREP [facility])    EKG   Radiology No results found.  Procedures Procedures (including critical care time)  Medications Ordered in UC Medications - No data to display  Initial Impression / Assessment and Plan / UC Course  I have reviewed the triage vital signs and the nursing notes.  Pertinent labs & imaging results that were available during my care of the patient were reviewed by me and considered in my medical decision making (see chart for details).    28 year old female presents with viral URI.  Strep negative.  Awaiting COVID test results.  Viscous lidocaine as needed.  Over-the-counter Zyrtec-D.  Supportive care.  Final Clinical Impressions(s) / UC Diagnoses   Final diagnoses:  Viral URI     Discharge Instructions     Strep negative.  Medication as directed.  OTC Zyrtec D.  Take care   Dr. Lacinda Axon    ED Prescriptions    Medication Sig Dispense Auth. Provider   lidocaine (XYLOCAINE) 2 % solution Gargle  15 mL every 3 hours as needed. May swallow if desired. 200 mL Tommie Sams, DO     PDMP not reviewed this encounter.   Tommie Sams, [place] 05/23/19 1103

## 2019-05-23 NOTE — ED Triage Notes (Signed)
Patient c/o sore throat that started yesterday.  Patient denies fevers.  

## 2019-05-23 NOTE — Discharge Instructions (Signed)
Strep negative.  Medication as directed.  OTC Zyrtec D.  Take care  Dr. Lacinda Axon

## 2019-05-24 LAB — NOVEL CORONAVIRUS, NAA (HOSP ORDER, SEND-OUT TO REF LAB; TAT 18-24 HRS): SARS-CoV-2, NAA: NOT DETECTED

## 2019-06-01 NOTE — Progress Notes (Signed)
Cardiology Office Note    Date:  06/05/2019   ID:  Melanie Valdez, DOB 11/27/1990, MRN [id number]  PCP:  Trey SailorsPollak, Adriana M, PA-C  Cardiologist:  Debbe OdeaBrian Agbor-Etang, MD  Electrophysiologist:  None   Chief Complaint: Follow up  History of Present Illness:   Melanie Valdez is a 28 y.o. female with history of PVCs presents for follow up of palpitations.   She was seen by Dr. Azucena CecilAgbor-Etang in consult on 05/01/2019 for a 2 month history of palpitations occurring multiple times per day and lasting for a couple of minutes.  There was some associated lightheadedness, dizziness, and occasionally shortness of breath noted with palpitations.  She reported an irregular heart beat and prolonged pauses to her manual palpation of her pulse.  [facility]2019 for fever and SOB and diagnosed with a viral URI.  EKG read as sinus rhythm with occasional PVCs.  EKG in our office showed sinus rhythm without significant ectopy or st/t changes.  She underwent Zio monitoring that showed sinus rhythm with an average heart rate of 88 bpm, (51-154 bpm), rare isolated PACs, atrial couplets, PVCs, and ventricular couplets.  Ventricular trigeminy was noted.  Some of the patient triggered events corresponded to PVCs/ventricular trigeminy.   Patient comes in doing reasonably well.  She indicates she was in her usual state of health until 02/2019 when she began to notice palpitations with associated dizziness, shortness of breath, and intermittent nausea/vomiting.  She indicates these palpitations occur on a daily basis though she does not always have the dizziness, shortness of breath, or nausea/vomiting, which has occurred once.  There is some associated chest discomfort with these palpitations.  She denies any lower extremity swelling, abdominal distention, orthopnea, PND, early satiety.  No presyncope or syncope with these palpitations.  She does report a history of 2 syncopal episodes as a child, both  while playing soccer.  She does not recall the events surrounding the first episode or if she was evaluated medically at that time.  She feels like the second episode was in the setting of a concussion.  There is no family history of sudden cardiac death.  She drinks 1 cup of coffee daily, otherwise water.  She denies any tobacco abuse or illicit substances.  She has very well controlled asthma and does not need to use her albuterol/Xopenex inhaler more than once to twice per year.   Labs: 04/2019 - TSH normal, HGB 12.5, PLT 169, potassium 4.3, SCr 0.93, albumin 4.5, AST/ALT normal 03/2018 - TC 233, TG 112, HDL 52, LDL 159    History reviewed. No pertinent past medical history.  Past Surgical History:  Procedure Laterality Date  . BREAST REDUCTION SURGERY    . CESAREAN SECTION  2018  . LEEP  2015  . TONSILLECTOMY    . TONSILLECTOMY AND ADENOIDECTOMY  1999  . WISDOM TOOTH EXTRACTION  2009    Current Medications: Current Meds  Medication Sig  . albuterol (VENTOLIN HFA) 108 (90 Base) MCG/ACT inhaler Inhale 1-2 puffs into the lungs every 6 (six) hours as needed.   . Cetirizine HCl (ZYRTEC ALLERGY) 10 MG CAPS Take 10 mg by mouth daily.   Marland Kitchen. escitalopram (LEXAPRO) 10 MG tablet Take 1 tablet (10 mg total) by mouth daily.  Marland Kitchen. etonogestrel (NEXPLANON) 68 MG IMPL implant 1 each by Subdermal route once.  . fexofenadine (ALLEGRA) 180 MG tablet Take 180 mg by mouth daily.  . fluticasone (FLONASE) 50 MCG/ACT nasal spray Place  2 sprays into both nostrils daily.  Marland Kitchen levalbuterol (XOPENEX) 0.31 MG/3ML nebulizer solution Inhale into the lungs.    Allergies:   Codeine, Sulfa antibiotics, and Hydrocodone-acetaminophen   Social History   Socioeconomic History  . Marital status: Married    Spouse name: Not on file  . Number of children: Not on file  . Years of education: Not on file  . Highest education level: Not on file  Occupational History  . Not on file  Social Needs  . Financial resource  strain: Not on file  . Food insecurity    Worry: Not on file    Inability: Not on file  . Transportation needs    Medical: Not on file    Non-medical: Not on file  Tobacco Use  . Smoking status: Never Smoker  . Smokeless tobacco: Never Used  Substance and Sexual Activity  . Alcohol use: Yes    Alcohol/week: 1.0 standard drinks    Types: 1 Glasses of wine per week  . Drug use: Never  . Sexual activity: Not on file  Lifestyle  . Physical activity    Days per week: Not on file    Minutes per session: Not on file  . Stress: Not on file  Relationships  . Social Herbalist on phone: Not on file    Gets together: Not on file    Attends religious service: Not on file    Active member of club or organization: Not on file    Attends meetings of clubs or organizations: Not on file    Relationship status: Not on file  Other Topics Concern  . Not on file  Social History Narrative  . Not on file     Family History:  The patient's family history includes Heart disease in her maternal grandfather; Hypertension in her maternal grandmother; Kidney disease in her maternal grandmother.  ROS:   Review of Systems  Constitutional: Positive for malaise/fatigue. Negative for chills, diaphoresis, fever and weight loss.  HENT: Negative for congestion.   Eyes: Negative for discharge and redness.  Respiratory: Negative for cough, hemoptysis, sputum production, shortness of breath and wheezing.   Cardiovascular: Positive for palpitations. Negative for chest pain, orthopnea, claudication, leg swelling and PND.  Gastrointestinal: Positive for nausea and vomiting. Negative for abdominal pain, blood in stool, heartburn and melena.  Genitourinary: Negative for hematuria.  Musculoskeletal: Negative for falls and myalgias.  Skin: Negative for rash.  Neurological: Positive for dizziness. Negative for tingling, tremors, sensory change, speech change, focal weakness, loss of consciousness and  weakness.  Endo/Heme/Allergies: Does not bruise/bleed easily.  Psychiatric/Behavioral: Negative for substance abuse. The patient is not nervous/anxious.   All other systems reviewed and are negative.    EKGs/Labs/Other Studies Reviewed:    Studies reviewed were summarized above. The additional studies were reviewed today:  Zio 04/2019: Patient had a min HR of 51 bpm, max HR of 154 bpm, and avg HR of 88 bpm. Predominant underlying rhythm was Sinus Rhythm. Isolated SVEs were rare (<1.0%), SVE Couplets were rare (<1.0%), and no SVE Triplets were present. Isolated VEs were rare (<1.0%), VE Couplets were rare (<1.0%), and no VE Triplets were present.    EKG:  EKG is ordered today.  The EKG ordered today demonstrates NSR, 76 bpm, no acute ST-T changes  Recent Labs: 05/01/2019: ALT 23; BUN 11; Creatinine, Ser 0.93; Hemoglobin 12.5; Platelets 169; Potassium 4.3; Sodium 142; TSH 2.930  Recent Lipid Panel    Component Value  Date/Time   CHOL 233 (H) 04/04/2018 1443   TRIG 112 04/04/2018 1443   HDL 52 04/04/2018 1443   CHOLHDL 4.5 (H) 04/04/2018 1443   LDLCALC 159 (H) 04/04/2018 1443    PHYSICAL EXAM:    VS:  BP 110/76 (BP Location: Left Arm, Patient Position: Sitting, Cuff Size: Normal)   Pulse 76   Ht 5\' 2"  (1.575 m)   Wt 160 lb (72.6 kg)   LMP 05/18/2019 (Exact Date)   BMI 29.26 kg/m   BMI: Body mass index is 29.26 kg/m.  Physical Exam  Constitutional: She is oriented to person, place, and time. She appears well-developed and well-nourished.  HENT:  Head: Normocephalic and atraumatic.  Eyes: Right eye exhibits no discharge. Left eye exhibits no discharge.  Neck: Normal range of motion. No JVD present.  Cardiovascular: Normal rate, regular rhythm, S1 normal, S2 normal and normal heart sounds. Exam reveals no distant heart sounds, no friction rub, no midsystolic click and no opening snap.  No murmur heard. Pulses:      Posterior tibial pulses are 2+ on the right side and 2+ on  the left side.  Pulmonary/Chest: Effort normal and breath sounds normal. No respiratory distress. She has no decreased breath sounds. She has no wheezes. She has no rales. She exhibits no tenderness.  Abdominal: Soft. She exhibits no distension. There is no abdominal tenderness.  Musculoskeletal:        General: No edema.  Neurological: She is alert and oriented to person, place, and time.  Skin: Skin is warm and dry. No cyanosis. Nails show no clubbing.  Psychiatric: She has a normal mood and affect. Her speech is normal and behavior is normal. Judgment and thought content normal.    Wt Readings from Last 3 Encounters:  06/05/19 160 lb (72.6 kg)  05/23/19 158 lb (71.7 kg)  05/01/19 162 lb 4 oz (73.6 kg)     ASSESSMENT & PLAN:   1. Palpitations/PVCs/ventricular trigeminy/shortness of breath: Patient notes increase in palpitations with associated shortness of breath, dizziness, nausea, and vomiting dating back to 02/2019.  Reportedly, EKG at the [facility] ED at that time demonstrated frequent PVCs.  She indicates a history of extrasystoles noted on prior examination.  Recent outpatient cardiac monitoring demonstrated rare isolated PVCs representing a less than 1% burden as well as a 7.7-second run of ventricular trigeminy.  Upon reviewing patient triggered events, her triggers are consistent with ventricular ectopy.  We will start work-up with obtaining an echocardiogram to evaluate for any structural abnormalities.  Pending these results we may consider a treadmill stress test, potential cardiac MRI, or potential signal average EKG.  Start patient on Toprol-XL 12.5 mg daily for symptomatic relief.  She denies any family history of sudden cardiac death.  She does report 2 episodes of syncope as a child both while playing soccer.  She does not recall the events of the first episode or if she was evaluated medically.  She feels like the second episode was in the setting of a concussion.    Disposition: F/u  with [facility]. [facility] or an APP in 1 month.   Medication Adjustments/Labs and Tests Ordered: Current medicines are reviewed at length with the patient today.  Concerns regarding medicines are outlined above. Medication changes, Labs and Tests ordered today are summarized above and listed in the Patient Instructions accessible in Encounters.   Signed, Azucena Cecil, PA-C 06/05/2019 10:07 AM     [facility] [address]  [place], [place] [address] [phone]

## 2019-06-02 ENCOUNTER — Ambulatory Visit: Payer: BC Managed Care – PPO | Admitting: Gastroenterology

## 2019-06-02 ENCOUNTER — Ambulatory Visit: Payer: BC Managed Care – PPO | Admitting: Physician Assistant

## 2019-06-05 ENCOUNTER — Ambulatory Visit (INDEPENDENT_AMBULATORY_CARE_PROVIDER_SITE_OTHER): Payer: BC Managed Care – PPO | Admitting: Physician Assistant

## 2019-06-05 ENCOUNTER — Encounter: Payer: Self-pay | Admitting: Physician Assistant

## 2019-06-05 ENCOUNTER — Other Ambulatory Visit: Payer: Self-pay

## 2019-06-05 VITALS — BP 110/76 | HR 76 | Ht 62.0 in | Wt 160.0 lb

## 2019-06-05 DIAGNOSIS — I498 Other specified cardiac arrhythmias: Secondary | ICD-10-CM

## 2019-06-05 DIAGNOSIS — R002 Palpitations: Secondary | ICD-10-CM | POA: Diagnosis not present

## 2019-06-05 DIAGNOSIS — I493 Ventricular premature depolarization: Secondary | ICD-10-CM

## 2019-06-05 DIAGNOSIS — R0600 Dyspnea, unspecified: Secondary | ICD-10-CM | POA: Diagnosis not present

## 2019-06-05 LAB — CULTURE, GROUP A STREP (THRC)

## 2019-06-05 MED ORDER — METOPROLOL SUCCINATE ER 25 MG PO TB24: 12.5000 mg | ORAL_TABLET | Freq: Every day | ORAL | 3 refills | Status: DC

## 2019-06-05 NOTE — Patient Instructions (Signed)
Medication Instructions:  1- START Toprol XL Take 0.5 tablets (12.5 mg total) by mouth daily. Take with or immediately following a meal. *If you need a refill on your cardiac medications before your next appointment, please call your pharmacy*  Lab Work: None ordered  If you have labs (blood work) drawn today and your tests are completely normal, you will receive your results only by: Marland Kitchen MyChart Message (if you have MyChart) OR . A paper copy in the mail If you have any lab test that is abnormal or we need to change your treatment, we will call you to review the results.  Testing/Procedures: 1- Echo  Please return to [facility] on ______________ at _______________ AM/PM for an Echocardiogram. Your physician has requested that you have an echocardiogram. Echocardiography is a painless test that uses sound waves to create images of your heart. It provides your doctor with information about the size and shape of your heart and how well your heart's chambers and valves are working. This procedure takes approximately one hour. There are no restrictions for this procedure. Please note; depending on visual quality an IV may need to be placed.    Follow-Up: At [facility], you and your health needs are our priority.  As part of our continuing mission to provide you with exceptional heart care, we have created designated Provider Care Teams.  These Care Teams include your primary Cardiologist (physician) and Advanced Practice Providers (APPs -  Physician Assistants and Nurse Practitioners) who all work together to provide you with the care you need, when you need it.  Your next appointment:   1 month  The format for your next appointment:   In Person  Provider:    You may see Kate Sable, MD or Christell Faith, PA-C.

## 2019-06-08 ENCOUNTER — Ambulatory Visit (INDEPENDENT_AMBULATORY_CARE_PROVIDER_SITE_OTHER): Payer: BC Managed Care – PPO | Admitting: Gastroenterology

## 2019-06-08 ENCOUNTER — Encounter: Payer: Self-pay | Admitting: Gastroenterology

## 2019-06-08 ENCOUNTER — Other Ambulatory Visit: Payer: Self-pay

## 2019-06-08 ENCOUNTER — Encounter

## 2019-06-08 VITALS — BP 118/77 | HR 78 | Temp 98.3°F | Ht 62.0 in | Wt 160.1 lb

## 2019-06-08 DIAGNOSIS — K921 Melena: Secondary | ICD-10-CM

## 2019-06-08 DIAGNOSIS — R197 Diarrhea, unspecified: Secondary | ICD-10-CM

## 2019-06-08 MED ORDER — BISACODYL EC 5 MG PO TBEC: DELAYED_RELEASE_TABLET | ORAL | 0 refills | Status: DC

## 2019-06-08 MED ORDER — NA SULFATE-K SULFATE-MG SULF 17.5-3.13-1.6 GM/177ML PO SOLN: 354.0000 mL | Freq: Once | ORAL | 0 refills | Status: AC

## 2019-06-08 NOTE — Progress Notes (Signed)
Melanie Valdez [address]  [address]  [place], [place] [address]  Main: [phone]  Fax: [phone]   Gastroenterology Consultation  Referring Provider:     Maryella ShiversPollak, Adriana M, PA-C Primary Care Physician:  Maryella ShiversPollak, Adriana M, PA-C Reason for Consultation:     Diarrhea        HPI:    Chief Complaint  Patient presents with  . New Patient (Initial Visit)  . Diarrhea    Patient is having this up to 10 times a day and right side abdominal pain     Melanie Valdez is a 28 y.o. y/o female referred for consultation & management  by Dr. Jodi MarblePollak, Lavella HammockAdriana M, PA-C.  Patient reports 3 to 4168-month history of diarrhea, 5-10 loose bowel movements a day, with associated blood streaks in stool over the last 2 weeks.  No weight loss.  Does report right upper quadrant abdominal cramping intermittently a that may not be associated with the diarrhea.  Does not occur after meals.  No weight loss, no nausea or vomiting.  No immediate family history of colon cancer or IBD.  Reports having diarrhea episodes 10 years ago and had a colonoscopy out of town at that time and states biopsies were done and everything was normal.  However, was not having blood in stool at the time.  Patient states she does not think she can get these records as they were a while ago and she does not remember exactly where it was done as she was living out of town  No melena.  Work-up by PCP reviewed in GI profile including C. difficile testing was normal.  CRP normal.  IgA and TTG IgA normal.  CBC, CMP normal as well  History reviewed. No pertinent past medical history.  Past Surgical History:  Procedure Laterality Date  . BREAST REDUCTION SURGERY    . CESAREAN SECTION  2018  . LEEP  2015  . TONSILLECTOMY    . TONSILLECTOMY AND ADENOIDECTOMY  1999  . WISDOM TOOTH EXTRACTION  2009    Prior to Admission medications   Medication Sig Start Date End Date Taking? Authorizing Provider  albuterol (VENTOLIN HFA) 108  (90 Base) MCG/ACT inhaler Inhale 1-2 puffs into the lungs every 6 (six) hours as needed.  09/09/18  Yes [provider]  Cetirizine HCl (ZYRTEC ALLERGY) 10 MG CAPS Take 10 mg by mouth daily.    Yes [provider]  escitalopram (LEXAPRO) 10 MG tablet Take 1 tablet (10 mg total) by mouth daily. 04/23/19  Yes Trey SailorsPollak, Adriana M, PA-C  etonogestrel (NEXPLANON) 68 MG IMPL implant 1 each by Subdermal route once.   Yes [provider]  fexofenadine (ALLEGRA) 180 MG tablet Take 180 mg by mouth daily.   Yes [provider]  fluticasone (FLONASE) 50 MCG/ACT nasal spray Place 2 sprays into both nostrils daily.   Yes [provider]  levalbuterol (XOPENEX) 0.31 MG/3ML nebulizer solution Inhale into the lungs.   Yes [provider]  metoprolol succinate (TOPROL-XL) 25 MG 24 hr tablet Take 0.5 tablets (12.5 mg total) by mouth daily. Take with or immediately following a meal. 06/05/19 09/03/19 Yes Dunn, Raymon Muttonyan M, PA-C  bisacodyl (BISACODYL) 5 MG EC tablet Please take two tablet between 1pm and 3pm the day before procedure 06/08/19   Pasty Spillersahiliani, Elainah Rhyne B, MD  Na Sulfate-K Sulfate-Mg Sulf 17.5-3.13-1.6 GM/177ML SOLN Take 354 mLs by mouth once for 1 dose. 06/08/19 06/08/19  Pasty Spillersahiliani, Keandra Medero B, MD    Family  History  Problem Relation Age of Onset  . Hypertension Maternal Grandmother   . Kidney disease Maternal Grandmother   . Heart disease Maternal Grandfather      Social History   Tobacco Use  . Smoking status: Never Smoker  . Smokeless tobacco: Never Used  Substance Use Topics  . Alcohol use: Yes    Alcohol/week: 1.0 standard drinks    Types: 1 Glasses of wine per week  . Drug use: Never    Allergies as of 06/08/2019 - Review Complete 06/08/2019  Allergen Reaction Noted  . Codeine  04/04/2018  . Sulfa antibiotics  04/04/2018  . Hydrocodone-acetaminophen Itching 06/08/2009    Review of Systems:    All systems reviewed and negative except where  noted in HPI.   Physical Exam:  BP 118/77 (BP Location: Left Arm, Patient Position: Sitting, Cuff Size: Normal)   Pulse 78   Temp 98.3 F (36.8 C) (Oral)   Ht 5\' 2"  (1.575 m)   Wt 160 lb 2 oz (72.6 kg)   LMP 05/18/2019 (Exact Date)   BMI 29.29 kg/m  Patient's last menstrual period was 05/18/2019 (exact date). Psych:  Alert and cooperative. Normal mood and affect. General:   Alert,  Well-developed, well-nourished, pleasant and cooperative in NAD Head:  Normocephalic and atraumatic. Eyes:  Sclera clear, no icterus.   Conjunctiva pink. Ears:  Normal auditory acuity. Nose:  No deformity, discharge, or lesions. Mouth:  No deformity or lesions,oropharynx pink & moist. Neck:  Supple; no masses or thyromegaly. Abdomen:  Normal bowel sounds.  No bruits.  Soft, non-tender and non-distended without masses, hepatosplenomegaly or hernias noted.  No guarding or rebound tenderness.    Msk:  Symmetrical without gross deformities. Good, equal movement & strength bilaterally. Pulses:  Normal pulses noted. Extremities:  No clubbing or edema.  No cyanosis. Neurologic:  Alert and oriented x3;  grossly normal neurologically. Skin:  Intact without significant lesions or rashes. No jaundice. Lymph Nodes:  No significant cervical adenopathy. Psych:  Alert and cooperative. Normal mood and affect.   Labs: CBC    Component Value Date/Time   WBC 5.7 05/01/2019 0954   RBC 4.53 05/01/2019 0954   HGB 12.5 05/01/2019 0954   HCT 37.0 05/01/2019 0954   PLT 169 05/01/2019 0954   MCV 82 05/01/2019 0954   MCH 27.6 05/01/2019 0954   MCHC 33.8 05/01/2019 0954   RDW 14.6 05/01/2019 0954   LYMPHSABS 1.8 05/01/2019 0954   EOSABS 0.2 05/01/2019 0954   BASOSABS 0.0 05/01/2019 0954   CMP     Component Value Date/Time   NA 142 05/01/2019 0954   K 4.3 05/01/2019 0954   CL 105 05/01/2019 0954   CO2 22 05/01/2019 0954   GLUCOSE 98 05/01/2019 0954   BUN 11 05/01/2019 0954   CREATININE 0.93 05/01/2019 0954    CALCIUM 9.2 05/01/2019 0954   PROT 6.9 05/01/2019 0954   ALBUMIN 4.5 05/01/2019 0954   AST 19 05/01/2019 0954   ALT 23 05/01/2019 0954   ALKPHOS 73 05/01/2019 0954   BILITOT 1.2 05/01/2019 0954   GFRNONAA 83 05/01/2019 0954   GFRAA 96 05/01/2019 0954    Imaging Studies: No results found.  Assessment and Plan:   Vanessa Kampf is a 28 y.o. y/o female has been referred for diarrhea and blood in stool  Ongoing diarrhea for 3 to 4 months with negative stool infectious work-up it is unlikely to be from infectious causes  Given her age and now blood streaks in  stool, it is important to rule out inflammatory bowel disease  I discussed the options of proceeding with colonoscopy, versus conservative management and proceeding with labs only at this time.  Patient prefers to proceed with colonoscopy  I have discussed alternative options, risks & benefits,  which include, but are not limited to, bleeding, infection, perforation,respiratory complication & drug reaction.  The patient agrees with this plan & written consent will be obtained.       Dr Vonda Antigua  Speech recognition software was used to dictate the above note.

## 2019-06-11 ENCOUNTER — Telehealth: Payer: Self-pay

## 2019-06-11 NOTE — Telephone Encounter (Signed)
Called patient to confirm patient procedure appointment and to remind patient that they have to go for a COVID test on 06/12/2019.  Left a detail message for patient and sent mychart message

## 2019-06-12 ENCOUNTER — Other Ambulatory Visit: Payer: Self-pay

## 2019-06-12 ENCOUNTER — Other Ambulatory Visit
Admission: RE | Admit: 2019-06-12 | Discharge: 2019-06-12 | Disposition: A | Discharge status: Home / Self Care | Payer: BC Managed Care – PPO | Source: Ambulatory Visit | Attending: Gastroenterology | Admitting: Gastroenterology

## 2019-06-12 DIAGNOSIS — Z01812 Encounter for preprocedural laboratory examination: Secondary | ICD-10-CM | POA: Insufficient documentation

## 2019-06-12 DIAGNOSIS — Z20828 Contact with and (suspected) exposure to other viral communicable diseases: Secondary | ICD-10-CM | POA: Insufficient documentation

## 2019-06-12 LAB — SARS CORONAVIRUS 2 (TAT 6-24 HRS): SARS Coronavirus 2: NEGATIVE

## 2019-06-16 ENCOUNTER — Ambulatory Visit
Admission: RE | Admit: 2019-06-16 | Discharge: 2019-06-16 | Disposition: A | Discharge status: Home / Self Care | Payer: BC Managed Care – PPO | Attending: Gastroenterology | Admitting: Gastroenterology

## 2019-06-16 ENCOUNTER — Encounter
Admission: RE | Disposition: A | Discharge status: Home / Self Care | Payer: Self-pay | Source: Home / Self Care | Attending: Gastroenterology

## 2019-06-16 ENCOUNTER — Ambulatory Visit: Payer: BC Managed Care – PPO | Admitting: Certified Registered Nurse Anesthetist

## 2019-06-16 ENCOUNTER — Encounter: Payer: Self-pay | Admitting: *Deleted

## 2019-06-16 DIAGNOSIS — R519 Headache, unspecified: Secondary | ICD-10-CM | POA: Insufficient documentation

## 2019-06-16 DIAGNOSIS — I10 Essential (primary) hypertension: Secondary | ICD-10-CM | POA: Diagnosis not present

## 2019-06-16 DIAGNOSIS — Z8249 Family history of ischemic heart disease and other diseases of the circulatory system: Secondary | ICD-10-CM | POA: Insufficient documentation

## 2019-06-16 DIAGNOSIS — Z79899 Other long term (current) drug therapy: Secondary | ICD-10-CM | POA: Insufficient documentation

## 2019-06-16 DIAGNOSIS — Z885 Allergy status to narcotic agent status: Secondary | ICD-10-CM | POA: Insufficient documentation

## 2019-06-16 DIAGNOSIS — Z7951 Long term (current) use of inhaled steroids: Secondary | ICD-10-CM | POA: Diagnosis not present

## 2019-06-16 DIAGNOSIS — K648 Other hemorrhoids: Secondary | ICD-10-CM

## 2019-06-16 DIAGNOSIS — K644 Residual hemorrhoidal skin tags: Secondary | ICD-10-CM | POA: Diagnosis not present

## 2019-06-16 DIAGNOSIS — Z882 Allergy status to sulfonamides status: Secondary | ICD-10-CM | POA: Insufficient documentation

## 2019-06-16 DIAGNOSIS — J45909 Unspecified asthma, uncomplicated: Secondary | ICD-10-CM | POA: Insufficient documentation

## 2019-06-16 DIAGNOSIS — R197 Diarrhea, unspecified: Secondary | ICD-10-CM

## 2019-06-16 DIAGNOSIS — I499 Cardiac arrhythmia, unspecified: Secondary | ICD-10-CM | POA: Diagnosis not present

## 2019-06-16 DIAGNOSIS — K921 Melena: Secondary | ICD-10-CM | POA: Diagnosis not present

## 2019-06-16 DIAGNOSIS — Z841 Family history of disorders of kidney and ureter: Secondary | ICD-10-CM | POA: Diagnosis not present

## 2019-06-16 HISTORY — PX: COLONOSCOPY WITH PROPOFOL: SHX5780

## 2019-06-16 HISTORY — DX: Other complications of anesthesia, initial encounter: T88.59XA

## 2019-06-16 HISTORY — DX: Unspecified asthma, uncomplicated: J45.909

## 2019-06-16 LAB — POCT PREGNANCY, URINE: Preg Test, Ur: NEGATIVE

## 2019-06-16 SURGERY — COLONOSCOPY WITH PROPOFOL
Anesthesia: General

## 2019-06-16 MED ORDER — PROPOFOL 10 MG/ML IV BOLUS
INTRAVENOUS | Status: DC | PRN
  Administered 2019-06-16 (×2): 30 mg via INTRAVENOUS
  Administered 2019-06-16: 60 mg via INTRAVENOUS

## 2019-06-16 MED ORDER — LIDOCAINE HCL (CARDIAC) PF 100 MG/5ML IV SOSY
PREFILLED_SYRINGE | INTRAVENOUS | Status: DC | PRN
  Administered 2019-06-16: 50 mg via INTRAVENOUS

## 2019-06-16 MED ORDER — PROPOFOL 500 MG/50ML IV EMUL
INTRAVENOUS | Status: DC | PRN
  Administered 2019-06-16: 175 ug/kg/min via INTRAVENOUS

## 2019-06-16 MED ORDER — SODIUM CHLORIDE 0.9 % IV SOLN
INTRAVENOUS | Status: DC
  Administered 2019-06-16: 1000 mL via INTRAVENOUS

## 2019-06-16 NOTE — Anesthesia Postprocedure Evaluation (Signed)
Anesthesia Post Note  Patient: Melanie Valdez  Procedure(s) Performed: COLONOSCOPY WITH PROPOFOL (N/A )  Patient location during evaluation: Endoscopy Anesthesia Type: General Level of consciousness: awake and alert Pain management: pain level controlled Vital Signs Assessment: post-procedure vital signs reviewed and stable Respiratory status: spontaneous breathing, nonlabored ventilation, respiratory function stable and patient connected to nasal cannula oxygen Cardiovascular status: blood pressure returned to baseline and stable Postop Assessment: no apparent nausea or vomiting Anesthetic complications: no     Last Vitals:  Vitals:   06/16/19 0948 06/16/19 0958  BP: 100/71 100/77  Pulse: 79 67  Resp: 19 15  Temp:    SpO2: 99% 100%    Last Pain:  Vitals:   06/16/19 0938  TempSrc: Tympanic  PainSc: Asleep                 Precious Haws Enio Hornback

## 2019-06-16 NOTE — Anesthesia Post-op Follow-up Note (Signed)
Anesthesia QCDR form completed.        

## 2019-06-16 NOTE — Op Note (Signed)
[facility] Patient Name: Tedi Hughson Procedure Date: 06/16/2019 8:38 AM MRN: [id number] Account #: [id number] Date of Birth: 18-Nov-1990 Admit Type: Outpatient Age: 28 Room: [facility] ENDO ROOM 2 Gender: Female Note Status: Finalized Procedure:             Colonoscopy Indications:           Clinically significant diarrhea of unexplained origin,                         Hematochezia Providers:             Varnita B. Bonna Gains MD, MD Medicines:             Monitored Anesthesia Care Complications:         No immediate complications. Procedure:             Pre-Anesthesia Assessment:                        - Prior to the procedure, a History and Physical was                         performed, and patient medications, allergies and                         sensitivities were reviewed. The patient's tolerance                         of previous anesthesia was reviewed.                        - The risks and benefits of the procedure and the                         sedation options and risks were discussed with the                         patient. All questions were answered and informed                         consent was obtained.                        - Patient identification and proposed procedure were                         verified prior to the procedure by the physician, the                         nurse, the anesthetist and the technician. The                         procedure was verified in the pre-procedure area in                         the procedure room in the endoscopy suite.                        - ASA Grade Assessment: II - A patient with mild  systemic disease.                        - After reviewing the risks and benefits, the patient                         was deemed in satisfactory condition to undergo the                         procedure.                        After obtaining informed consent, the  colonoscope was                         passed under direct vision. Throughout the procedure,                         the patient's blood pressure, pulse, and oxygen                         saturations were monitored continuously. The                         Colonoscope was introduced through the anus and                         advanced to the the terminal ileum. The colonoscopy                         was performed with ease. The patient tolerated the                         procedure well. The quality of the bowel preparation                         was good. Findings:      The perianal and digital rectal examinations were normal.      The rectum, sigmoid colon, descending colon, transverse colon, ascending       colon, cecum and ileum appeared normal. Biopsies for histology were       taken with a cold forceps from the entire colon for evaluation of       microscopic colitis.      Anal papilla(e) were hypertrophied.      Non-bleeding internal hemorrhoids were found during retroflexion. The       hemorrhoids were small. Impression:            - The rectum, sigmoid colon, descending colon,                         transverse colon, ascending colon, cecum and terminal                         ileum are normal. Biopsied.                        - Anal papilla(e) were hypertrophied.                        - Non-bleeding internal hemorrhoids.  Recommendation:        - Await pathology results.                        - Discharge patient to home.                        - Resume previous diet.                        - Continue present medications.                        - Return to primary care physician as previously                         scheduled.                        - The findings and recommendations were discussed with                         the patient.                        - The findings and recommendations were discussed with                         the patient's family.                         - High fiber diet. Procedure Code(s):     --- Professional ---                        [phone], Colonoscopy, flexible; with biopsy, single or                         multiple Diagnosis Code(s):     --- Professional ---                        K64.8, Other hemorrhoids                        K62.89, Other specified diseases of anus and rectum                        R19.7, Diarrhea, unspecified                        K92.1, Melena (includes Hematochezia) CPT copyright 2019 American Medical Association. All rights reserved. The codes documented in this report are preliminary and upon coder review may  be revised to meet current compliance requirements.  Melodie BouillonVarnita Tahiliani, MD Michel BickersVarnita B. Maximino Greenlandahiliani MD, MD 06/16/2019 9:51:26 AM This report has been signed electronically. Number of Addenda: 0 Note Initiated On: 06/16/2019 8:38 AM Scope Withdrawal Time: 0 hours 12 minutes 30 seconds  Total Procedure Duration: 0 hours 14 minutes 48 seconds  Estimated Blood Loss:  Estimated blood loss: none.      [facility]

## 2019-06-16 NOTE — H&P (Signed)
Vonda Antigua, MD [address], [place], [place], [place], [address] [address], [place], [place], [place], [address] Phone: [phone]  Fax: [phone]  Primary Care Physician:  Trinna Post, PA-C   Pre-Procedure History & Physical: HPI:  Melanie Valdez is a 28 y.o. female is here for a colonoscopy.   Past Medical History:  Diagnosis Date  . Asthma   . Complication of anesthesia   . Dysrhythmia   . Hypertension     Past Surgical History:  Procedure Laterality Date  . BREAST REDUCTION SURGERY    . BREAST SURGERY    . CESAREAN SECTION  2018  . LEEP  2015  . TONSILLECTOMY    . TONSILLECTOMY AND ADENOIDECTOMY  1999  . [place] EXTRACTION  2009    Prior to Admission medications   Medication Sig Start Date End Date Taking? Authorizing Provider  bisacodyl (BISACODYL) 5 MG EC tablet Please take two tablet between 1pm and 3pm the day before procedure 06/08/19  Yes Kendricks Reap, Margretta Sidle B, MD  Cetirizine HCl (ZYRTEC ALLERGY) 10 MG CAPS Take 10 mg by mouth daily.    Yes [provider]  escitalopram (LEXAPRO) 10 MG tablet Take 1 tablet (10 mg total) by mouth daily. 04/23/19  Yes Carles Collet M, PA-C  fexofenadine (ALLEGRA) 180 MG tablet Take 180 mg by mouth daily.   Yes [provider]  fluticasone (FLONASE) 50 MCG/ACT nasal spray Place 2 sprays into both nostrils daily.   Yes [provider]  metoprolol succinate (TOPROL-XL) 25 MG 24 hr tablet Take 0.5 tablets (12.5 mg total) by mouth daily. Take with or immediately following a meal. 06/05/19 09/03/19 Yes Dunn, Areta Haber, PA-C  albuterol (VENTOLIN HFA) 108 (90 Base) MCG/ACT inhaler Inhale 1-2 puffs into the lungs every 6 (six) hours as needed.  09/09/18   [provider]  etonogestrel (NEXPLANON) 68 MG IMPL implant 1 each by Subdermal route once.    [provider]  levalbuterol Penne Lash) 0.31 MG/3ML nebulizer solution Inhale into the lungs.    [provider]     Allergies as of 06/08/2019 - Review Complete 06/08/2019  Allergen Reaction Noted  . Codeine  04/04/2018  . Sulfa antibiotics  04/04/2018  . Hydrocodone-acetaminophen Itching 06/08/2009    Family History  Problem Relation Age of Onset  . Hypertension Maternal Grandmother   . Kidney disease Maternal Grandmother   . Heart disease Maternal Grandfather     Social History   Socioeconomic History  . Marital status: Married    Spouse name: Not on file  . Number of children: Not on file  . Years of education: Not on file  . Highest education level: Not on file  Occupational History  . Not on file  Social Needs  . Financial resource strain: Not on file  . Food insecurity    Worry: Not on file    Inability: Not on file  . Transportation needs    Medical: Not on file    Non-medical: Not on file  Tobacco Use  . Smoking status: Never Smoker  . Smokeless tobacco: Never Used  Substance and Sexual Activity  . Alcohol use: Yes    Alcohol/week: 1.0 standard drinks    Types: 1 Glasses of wine per week  . Drug use: Never  . Sexual activity: Not on file  Lifestyle  . Physical activity    Days per week: Not on file    Minutes per session: Not on file  . Stress: Not  on file  Relationships  . Social Musician on phone: Not on file    Gets together: Not on file    Attends religious service: Not on file    Active member of club or organization: Not on file    Attends meetings of clubs or organizations: Not on file    Relationship status: Not on file  . Intimate partner violence    Fear of current or ex partner: Not on file    Emotionally abused: Not on file    Physically abused: Not on file    Forced sexual activity: Not on file  Other Topics Concern  . Not on file  Social History Narrative  . Not on file    Review of Systems: See HPI, otherwise negative ROS  Physical Exam: BP 106/77   Pulse 73   Temp 97.8 F (36.6 C) (Tympanic)   Resp 16   Ht 5\' 2"  (1.575  m)   Wt 68.9 kg   LMP 05/18/2019 (Exact Date)   SpO2 99%   BMI 27.80 kg/m  General:   Alert,  pleasant and cooperative in NAD Head:  Normocephalic and atraumatic. Neck:  Supple; no masses or thyromegaly. Lungs:  Clear throughout to auscultation, normal respiratory effort.    Heart:  +S1, +S2, Regular rate and rhythm, No edema. Abdomen:  Soft, nontender and nondistended. Normal bowel sounds, without guarding, and without rebound.   Neurologic:  Alert and  oriented x4;  grossly normal neurologically.  Impression/Plan: Melanie Valdez is here for a colonoscopy to be performed for diarrhea and blood in stool  Risks, benefits, limitations, and alternatives regarding  colonoscopy have been reviewed with the patient.  Questions have been answered.  All parties agreeable.   Hoy Finlay, MD  06/16/2019, 9:11 AM

## 2019-06-16 NOTE — Anesthesia Preprocedure Evaluation (Signed)
Anesthesia Evaluation  Patient identified by MRN, date of birth, ID band Patient awake    Reviewed: Allergy & Precautions, H&P , NPO status , Patient's Chart, lab work & pertinent test results, reviewed documented beta blocker date and time   Airway Mallampati: I  TM Distance: >3 FB Neck ROM: full    Dental  (+) Dental Advidsory Given, Teeth Intact   Pulmonary neg shortness of breath, asthma , neg sleep apnea, neg recent URI,    Pulmonary exam normal        Cardiovascular Exercise Tolerance: Good hypertension, (-) angina(-) Past MI and (-) Cardiac Stents Normal cardiovascular exam+ dysrhythmias (PVCs) (-) Valvular Problems/Murmurs     Neuro/Psych  Headaches, neg Seizures negative psych ROS   GI/Hepatic negative GI ROS, Neg liver ROS,   Endo/Other  negative endocrine ROS  Renal/GU negative Renal ROS  negative genitourinary   Musculoskeletal   Abdominal   Peds  Hematology negative hematology ROS (+)   Anesthesia Other Findings Past Medical History: No date: Asthma No date: Complication of anesthesia No date: Dysrhythmia No date: Hypertension   Reproductive/Obstetrics negative OB ROS                             Anesthesia Physical Anesthesia Plan  ASA: II  Anesthesia Plan: General   Post-op Pain Management:    Induction: Intravenous  PONV Risk Score and Plan: 3 and Propofol infusion and TIVA  Airway Management Planned: Natural Airway and Nasal Cannula  Additional Equipment:   Intra-op Plan:   Post-operative Plan:   Informed Consent: I have reviewed the patients History and Physical, chart, labs and discussed the procedure including the risks, benefits and alternatives for the proposed anesthesia with the patient or authorized representative who has indicated his/her understanding and acceptance.     Dental Advisory Given  Plan Discussed with: Anesthesiologist, CRNA and  Surgeon  Anesthesia Plan Comments:         Anesthesia Quick Evaluation

## 2019-06-16 NOTE — Anesthesia Procedure Notes (Signed)
Date/Time: 06/16/2019 9:09 AM Performed by: Johnna Acosta, CRNA Pre-anesthesia Checklist: Patient identified, Emergency Drugs available, Suction available, Patient being monitored and Timeout performed Oxygen Delivery Method: Nasal cannula Preoxygenation: Pre-oxygenation with 100% oxygen Induction Type: IV induction

## 2019-06-16 NOTE — Transfer of Care (Signed)
Immediate Anesthesia Transfer of Care Note  Patient: Melanie Valdez  Procedure(s) Performed: COLONOSCOPY WITH PROPOFOL (N/A )  Patient Location: PACU  Anesthesia Type:General  Level of Consciousness: sedated  Airway & Oxygen Therapy: Patient Spontanous Breathing  Post-op Assessment: Report given to RN  Post vital signs: Reviewed and stable  Last Vitals:  Vitals Value Taken Time  BP 98/73 06/16/19 0938  Temp 36.2 C 06/16/19 0938  Pulse 72 06/16/19 0939  Resp 26 06/16/19 0939  SpO2 96 % 06/16/19 0939  Vitals shown include unvalidated device data.  Last Pain:  Vitals:   06/16/19 0938  TempSrc: Tympanic  PainSc: Asleep         Complications: No apparent anesthesia complications

## 2019-06-17 ENCOUNTER — Other Ambulatory Visit: Payer: Self-pay | Admitting: Gastroenterology

## 2019-06-17 ENCOUNTER — Telehealth: Payer: Self-pay

## 2019-06-17 LAB — SURGICAL PATHOLOGY

## 2019-06-17 MED ORDER — LOPERAMIDE HCL 2 MG PO TABS: 2.0000 mg | ORAL_TABLET | Freq: Four times a day (QID) | ORAL | 0 refills | Status: DC | PRN

## 2019-06-17 NOTE — Telephone Encounter (Signed)
Called and left a message for call back. Sent message through [facility] also

## 2019-06-17 NOTE — Telephone Encounter (Signed)
Patient read my chart message

## 2019-06-17 NOTE — Telephone Encounter (Signed)
-----   Message from Virgel Manifold, MD sent at 06/17/2019  1:21 PM EST ----- Caryl Pina please let the patient know, her biopsies were benign and did not show any microscopic colitis or inflammation.  I have sent Imodium to her pharmacy to be used as needed for loose bowel movements.  We will continue this and see if it helps her symptoms

## 2019-06-18 ENCOUNTER — Encounter: Payer: Self-pay | Admitting: Gastroenterology

## 2019-06-19 ENCOUNTER — Other Ambulatory Visit: Payer: Self-pay | Admitting: Gastroenterology

## 2019-06-19 MED ORDER — HYDROCORTISONE ACETATE 25 MG RE SUPP: 25.0000 mg | Freq: Two times a day (BID) | RECTAL | 0 refills | Status: AC

## 2019-06-19 NOTE — Telephone Encounter (Signed)
Patient verbalized understanding  

## 2019-06-25 ENCOUNTER — Encounter: Payer: Self-pay | Admitting: Physician Assistant

## 2019-07-02 ENCOUNTER — Other Ambulatory Visit: Payer: Self-pay

## 2019-07-02 ENCOUNTER — Ambulatory Visit (INDEPENDENT_AMBULATORY_CARE_PROVIDER_SITE_OTHER): Payer: BC Managed Care – PPO

## 2019-07-02 DIAGNOSIS — R0600 Dyspnea, unspecified: Secondary | ICD-10-CM

## 2019-07-05 NOTE — Progress Notes (Signed)
Cardiology Office Note    Date:  07/06/2019   ID:  Melanie FinlayKarleen Jemison, DOB 11/25/1990, MRN [id number]  PCP:  Trey SailorsPollak, Adriana M, PA-C  Cardiologist:  Debbe OdeaBrian Agbor-Etang, MD  Electrophysiologist:  None   Chief Complaint: Follow up  History of Present Illness:   Melanie Valdez is a 28 y.o. female with history of PVCs presents for follow up of palpitations.   She was seen by Dr. Azucena CecilAgbor-Etang in consult on 05/01/2019 for a 2 month history of palpitations occurring multiple times per day and lasting for a couple of minutes.  There was some associated lightheadedness, dizziness, and occasionally shortness of breath noted with palpitations.  She reported an irregular heart beat and prolonged pauses to her manual palpation of her pulse.  [facility] in 02/2019 for fever and SOB and diagnosed with a viral URI.  EKG read as sinus rhythm with occasional PVCs.  EKG in our office showed sinus rhythm without significant ectopy or st/t changes.  She underwent Zio monitoring that showed sinus rhythm with an average heart rate of 88 bpm, (51-154 bpm), rare isolated PACs, atrial couplets, PVCs, and ventricular couplets.  Ventricular trigeminy was noted.  Some of the patient triggered events corresponded to PVCs/ventricular trigeminy.  I saw her on 06/05/2019 at which time she continued to note palpitations occurring on a daily basis, dating back to 02/2019.  She did report a history of 2 syncopal episodes as a child, both while playing soccer.  She did not recall the events surrounding the first episode or if she was evaluated medically at that time.  She felt like the second episode was in the setting of a concussion.  There was no family history of sudden cardiac death.  She was drinking 1 cup of coffee daily, otherwise water.  She denied any tobacco abuse or illicit substances.  She was started on Toprol-XL 12.5 mg daily.  Since I last saw her, she was evaluated by GI for diarrhea with associated  blood streaks in her stool with subsequent colonoscopy showing nonbleeding internal hemorrhoids along with benign terminal ileum and colon biopsies.   She underwent echo on 07/02/2019 to evaluate for any structure abnormalities in the setting of her PVCs which showed an EF of 60-65%, mild MR/TR, normal PASP.    She comes in doing reasonably well from a cardiac perspective.  She indicates since starting Toprol-XL she has noted an improvement in her PVC/palpitation burden though continues to note them on a daily basis.  She also continues to note a fullness in her chest that is not associated with exertion.  She is uncertain if there is any association between the fullness and palpitation burden.  No dizziness, presyncope, syncope.  No shortness of breath.  No lower extremity swelling.  She continues to have diarrhea on a daily basis.   Labs: 04/2019 - TSH normal, HGB 12.5, PLT 169, potassium 4.3, SCr 0.93, albumin 4.5, AST/ALT normal 03/2018 - TC 233, TG 112, HDL 52, LDL 159  Past Medical History:  Diagnosis Date  . Asthma   . Complication of anesthesia   . PVC's (premature ventricular contractions)     Past Surgical History:  Procedure Laterality Date  . BREAST REDUCTION SURGERY    . BREAST SURGERY    . CESAREAN SECTION  2018  . COLONOSCOPY WITH PROPOFOL N/A 06/16/2019   Procedure: COLONOSCOPY WITH PROPOFOL;  Surgeon: Pasty Spillersahiliani, Varnita B, MD;  Location: [facility] ENDOSCOPY;  Service: Endoscopy;  Laterality: N/A;  .  LEEP  2015  . TONSILLECTOMY    . TONSILLECTOMY AND ADENOIDECTOMY  1999  . WISDOM TOOTH EXTRACTION  2009    Current Medications: Current Meds  Medication Sig  . albuterol (VENTOLIN HFA) 108 (90 Base) MCG/ACT inhaler Inhale 1-2 puffs into the lungs every 6 (six) hours as needed.   . Cetirizine HCl (ZYRTEC ALLERGY) 10 MG CAPS Take 5 mg by mouth daily.   Marland Kitchen escitalopram (LEXAPRO) 10 MG tablet Take 1 tablet (10 mg total) by mouth daily.  Marland Kitchen etonogestrel (NEXPLANON) 68 MG IMPL  implant 1 each by Subdermal route once.  . fexofenadine (ALLEGRA) 180 MG tablet Take 180 mg by mouth daily.  . fluticasone (FLONASE) 50 MCG/ACT nasal spray Place 2 sprays into both nostrils daily.  Marland Kitchen levalbuterol (XOPENEX) 0.31 MG/3ML nebulizer solution Inhale into the lungs.  . loperamide (IMODIUM A-D) 2 MG tablet Take 1 tablet (2 mg total) by mouth 4 (four) times daily as needed for up to 30 doses for diarrhea or loose stools.  . metoprolol succinate (TOPROL-XL) 25 MG 24 hr tablet Take 0.5 tablets (12.5 mg total) by mouth daily. Take with or immediately following a meal.    Allergies:   Codeine, Sulfa antibiotics, and Hydrocodone-acetaminophen   Social History   Socioeconomic History  . Marital status: Married    Spouse name: Not on file  . Number of children: Not on file  . Years of education: Not on file  . Highest education level: Not on file  Occupational History  . Not on file  Social Needs  . Financial resource strain: Not on file  . Food insecurity    Worry: Not on file    Inability: Not on file  . Transportation needs    Medical: Not on file    Non-medical: Not on file  Tobacco Use  . Smoking status: Never Smoker  . Smokeless tobacco: Never Used  Substance and Sexual Activity  . Alcohol use: Yes    Alcohol/week: 1.0 standard drinks    Types: 1 Glasses of wine per week  . Drug use: Never  . Sexual activity: Not on file  Lifestyle  . Physical activity    Days per week: Not on file    Minutes per session: Not on file  . Stress: Not on file  Relationships  . Social Herbalist on phone: Not on file    Gets together: Not on file    Attends religious service: Not on file    Active member of club or organization: Not on file    Attends meetings of clubs or organizations: Not on file    Relationship status: Not on file  Other Topics Concern  . Not on file  Social History Narrative  . Not on file     Family History:  The patient's family history  includes Heart disease in her maternal grandfather; Hypertension in her maternal grandmother; Kidney disease in her maternal grandmother.  ROS:   Review of Systems  Constitutional: Negative for chills, diaphoresis, fever, malaise/fatigue and weight loss.  HENT: Negative for congestion.   Eyes: Negative for discharge and redness.  Respiratory: Negative for cough, hemoptysis, sputum production, shortness of breath and wheezing.   Cardiovascular: Positive for chest pain and palpitations. Negative for orthopnea, claudication, leg swelling and PND.       Chest fullness  Gastrointestinal: Positive for diarrhea. Negative for abdominal pain, blood in stool, constipation, heartburn, melena, nausea and vomiting.  Genitourinary: Negative for hematuria.  Musculoskeletal: Negative for falls and myalgias.  Skin: Negative for rash.  Neurological: Negative for dizziness, tingling, tremors, sensory change, speech change, focal weakness, loss of consciousness and weakness.  Endo/Heme/Allergies: Does not bruise/bleed easily.  Psychiatric/Behavioral: Negative for substance abuse. The patient is not nervous/anxious.   All other systems reviewed and are negative.    EKGs/Labs/Other Studies Reviewed:    Studies reviewed were summarized above. The additional studies were reviewed today:  2D echo 07/02/2019:  1. Left ventricular ejection fraction, by visual estimation, is 60 to 65%. The left ventricle has normal function. There is no left ventricular hypertrophy.  2. Global right ventricle has normal systolic function.The right ventricular size is normal. No increase in right ventricular wall thickness.  3. Left atrial size was normal.  4. Normal pulmonary artery systolic pressure. __________  Luci [facility] 05/2019: Patient had a min HR of 51 bpm, max HR of 154 bpm, and avg HR of 88 bpm. Predominant underlying rhythm was Sinus Rhythm. Isolated SVEs were rare (<1.0%), SVE Couplets were rare (<1.0%), and no SVE  Triplets were present. Isolated VEs were rare (<1.0%), VE Couplets were rare (<1.0%), and no VE Triplets were present.    EKG:  EKG is ordered today.  The EKG ordered today demonstrates NSR, 79 bpm, normal axis, no acute ST-T changes  Recent Labs: 05/01/2019: ALT 23; BUN 11; Creatinine, Ser 0.93; Hemoglobin 12.5; Platelets 169; Potassium 4.3; Sodium 142; TSH 2.930  Recent Lipid Panel    Component Value Date/Time   CHOL 233 (H) 04/04/2018 1443   TRIG 112 04/04/2018 1443   HDL 52 04/04/2018 1443   CHOLHDL 4.5 (H) 04/04/2018 1443   LDLCALC 159 (H) 04/04/2018 1443    PHYSICAL EXAM:    VS:  BP 120/78 (BP Location: Left Arm, Patient Position: Sitting, Cuff Size: Normal)   Pulse 77   Temp 98.6 F (37 C)   Ht  (1.575 m)   Wt 161 lb 8 oz (73.3 kg)   BMI 29.54 kg/m   BMI: Body mass index is 29.54 kg/m.  Physical Exam  Constitutional: She is oriented to person, place, and time. She appears well-developed and well-nourished.  HENT:  Head: Normocephalic and atraumatic.  Eyes: Right eye exhibits no discharge. Left eye exhibits no discharge.  Neck: Normal range of motion. No JVD present.  Cardiovascular: Normal rate, regular rhythm, S1 normal, S2 normal and normal heart sounds. Exam reveals no distant heart sounds, no friction rub, no midsystolic click and no opening snap.  No murmur heard. Pulses:      Posterior tibial pulses are 2+ on the right side and 2+ on the left side.  Pulmonary/Chest: Effort normal and breath sounds normal. No respiratory distress. She has no decreased breath sounds. She has no wheezes. She has no rales. She exhibits no tenderness.  Abdominal: Soft. She exhibits no distension. There is no abdominal tenderness.  Musculoskeletal:        General: No edema.  Neurological: She is alert and oriented to person, place, and time.  Skin: Skin is warm and dry. No cyanosis. Nails show no clubbing.  Psychiatric: She has a normal mood and affect. Her speech is normal  and behavior is normal. Judgment and thought content normal.    Wt Readings from Last 3 Encounters:  07/06/19 161 lb 8 oz (73.3 kg)  06/16/19 152 lb (68.9 kg)  06/08/19 160 lb 2 oz (72.6 kg)     ASSESSMENT & PLAN:   1. Palpitations/PVCs: Symptoms are overall improving  on low-dose Toprol-XL 25 mg daily.  Heart rate today of 77 bpm with an average heart rate of 88 bpm on recent outpatient cardiac monitoring.  She indicates her Apple Watch tells her her heart rate has been running in the upper 50s to low 60s bpm.  In this setting, we will continue her on Toprol-XL 12.5 mg daily for now.  Schedule GXT to evaluate for ischemic changes as well as significant ventricular ectopy during peak stress and in recovery.  Outpatient cardiac monitoring showed rare PVCs representing a less than 1% burden along with rare PACs, also representing less than 1% burden and was overall reassuring without any significant sustained arrhythmia.  Recent echo without any significant structural abnormalities.  2. Chest discomfort: Less likely ischemic in etiology given atypical symptoms, young age, and in the setting of being female.  3. Diarrhea: Recent colonoscopy as outlined above.  Check magnesium.  Disposition: F/u with Dr. Azucena Cecil or an APP in 1 to 2 months.   Medication Adjustments/Labs and Tests Ordered: Current medicines are reviewed at length with the patient today.  Concerns regarding medicines are outlined above. Medication changes, Labs and Tests ordered today are summarized above and listed in the Patient Instructions accessible in Encounters.   Signed, Eula Listen, PA-C 07/06/2019 9:21 AM     [facility] [place] [address] [place], [place] [address] [phone]

## 2019-07-06 ENCOUNTER — Other Ambulatory Visit: Payer: Self-pay

## 2019-07-06 ENCOUNTER — Ambulatory Visit (INDEPENDENT_AMBULATORY_CARE_PROVIDER_SITE_OTHER): Payer: BC Managed Care – PPO | Admitting: Physician Assistant

## 2019-07-06 ENCOUNTER — Encounter: Payer: Self-pay | Admitting: Physician Assistant

## 2019-07-06 VITALS — BP 120/78 | HR 77 | Temp 98.6°F | Ht 62.0 in | Wt 161.5 lb

## 2019-07-06 DIAGNOSIS — I493 Ventricular premature depolarization: Secondary | ICD-10-CM

## 2019-07-06 DIAGNOSIS — R0789 Other chest pain: Secondary | ICD-10-CM | POA: Diagnosis not present

## 2019-07-06 DIAGNOSIS — R002 Palpitations: Secondary | ICD-10-CM

## 2019-07-06 DIAGNOSIS — R197 Diarrhea, unspecified: Secondary | ICD-10-CM

## 2019-07-06 NOTE — Patient Instructions (Addendum)
Medication Instructions:   1. Your physician recommends that you continue on your current medications as directed. Please refer to the Current Medication list given to you today.  *If you need a refill on your cardiac medications before your next appointment, please call your pharmacy*  Lab Work:  1. Your physician recommends that you have lab work today (Magnesium)  If you have labs (blood work) drawn today and your tests are completely normal, you will receive your results only by: Marland Kitchen MyChart Message (if you have MyChart) OR . A paper copy in the mail If you have any lab test that is abnormal or we need to change your treatment, we will call you to review the results.  2. CV19 Pre admit testing DRIVE THRU  Please report to the PAT testing site [facility]) on _________ date ___________ time for your DRIVE THRU covid testing that is required prior to your procedure.  Following covid testing, please remain in quarantine. If you must be around others, please wash hands, avoid touching face and wear your mask.    Testing/Procedures:  1. Exercise Stress Test   An exercise stress test is a test to check how your heart works during exercise and checks for coronary artery disease.  Your heart rate will be watched while you are resting and while you are exercising.   Patches (electrodes) will be put on your chest. Do not put lotions, powders, creams, or oils on your chest before the test. Wires will be connected to the patches. The wires will send signals to a machine to record your heartbeat. Wear comfortable shoes and clothing.  Follow instructions from your doctor about what you cannot eat or drink before the test.  Before the procedure  Follow instructions from your doctor about what you cannot eat or drink. ? Do not have any drinks or foods that have caffeine in them for 24 hours before the test, or as told by your doctor. This includes coffee, tea (even decaf tea),  sodas, chocolate, and cocoa. ? Hold beta-blocker medicines for night before and the morning of. Take all other BP meds that aren't Bbs.  If you use an inhaler, bring it with you to the test.  Do not use any products that have nicotine or tobacco in them, such as cigarettes and e-cigarettes. Stop using them at least 4 hours before the test. If you need help quitting, ask your doctor.   Follow-Up: At [facility], you and your health needs are our priority.  As part of our continuing mission to provide you with exceptional heart care, we have created designated Provider Care Teams.  These Care Teams include your primary Cardiologist (physician) and Advanced Practice Providers (APPs -  Physician Assistants and Nurse Practitioners) who all work together to provide you with the care you need, when you need it.  Your next appointment:   1-2 month(s)  The format for your next appointment:   In Person  Provider:   Kate Sable, MD or Christell Faith, PA

## 2019-07-07 LAB — MAGNESIUM: Magnesium: 1.9 mg/dL (ref 1.6–2.3)

## 2019-07-17 ENCOUNTER — Other Ambulatory Visit: Admission: RE | Admit: 2019-07-17 | Payer: BC Managed Care – PPO | Source: Ambulatory Visit

## 2019-07-20 ENCOUNTER — Other Ambulatory Visit
Admission: RE | Admit: 2019-07-20 | Discharge: 2019-07-20 | Disposition: A | Discharge status: Home / Self Care | Payer: BC Managed Care – PPO | Source: Ambulatory Visit | Attending: Cardiovascular Disease | Admitting: Cardiovascular Disease

## 2019-07-20 ENCOUNTER — Other Ambulatory Visit: Payer: Self-pay

## 2019-07-20 DIAGNOSIS — Z20828 Contact with and (suspected) exposure to other viral communicable diseases: Secondary | ICD-10-CM | POA: Diagnosis not present

## 2019-07-20 DIAGNOSIS — Z01812 Encounter for preprocedural laboratory examination: Secondary | ICD-10-CM | POA: Diagnosis not present

## 2019-07-21 ENCOUNTER — Other Ambulatory Visit: Payer: BC Managed Care – PPO

## 2019-07-21 ENCOUNTER — Telehealth: Payer: Self-pay | Admitting: *Deleted

## 2019-07-21 LAB — SARS CORONAVIRUS 2 (TAT 6-24 HRS): SARS Coronavirus 2: NEGATIVE

## 2019-07-21 NOTE — Telephone Encounter (Signed)
I called and spoke with the patient regarding her GXT scheduled for tomorrow at 2:00 pm.  - you may eat a light breakfast/ lunch prior to your procedure - no caffeine for 24 hours prior to your test (coffee, tea, soft drinks, or chocolate)  - no smoking/ vaping for 4 hours prior to your test - you may take your regular medications the day of your test except for: DO NOT take metoprolol for 24 hours prior - bring any inhalers with you to your test - wear comfortable clothing & tennis/ non-skid shoes to walk on the treadmill   The patient voiced understanding of the above and is agreeable.

## 2019-07-22 ENCOUNTER — Ambulatory Visit (INDEPENDENT_AMBULATORY_CARE_PROVIDER_SITE_OTHER): Payer: BC Managed Care – PPO

## 2019-07-22 ENCOUNTER — Other Ambulatory Visit: Payer: Self-pay

## 2019-07-22 DIAGNOSIS — R0789 Other chest pain: Secondary | ICD-10-CM

## 2019-07-22 DIAGNOSIS — I493 Ventricular premature depolarization: Secondary | ICD-10-CM | POA: Diagnosis not present

## 2019-07-24 LAB — EXERCISE TOLERANCE TEST
Estimated workload: 11.8 METS
Exercise duration (min): 10 min
Exercise duration (sec): 5 s
MPHR: 190 {beats}/min
Peak HR: 184 {beats}/min
Percent HR: 96 %
RPE: 16
Rest HR: 87 {beats}/min

## 2019-07-27 ENCOUNTER — Telehealth: Payer: Self-pay

## 2019-07-27 NOTE — Telephone Encounter (Signed)
-----   Message from Rise Mu, PA-C sent at 07/24/2019  4:18 PM EST ----- Treadmill stress test was normal and negative for inducible ischemia, significant arrhythmia, or ventricular ectopy at peak stress or recovery. Blood pressure was normal during exercise. Overall, reassuring study. She can follow-up with primary cardiologist as previously scheduled.

## 2019-07-27 NOTE — Telephone Encounter (Signed)
Attempted to call patient. LMTCB 07/27/2019

## 2019-07-28 NOTE — Telephone Encounter (Signed)
Call to patient to review stress test findings. Pt verbalized understanding and had no further questions or orders at this time.   Confirmed upcoming appt.   Advised pt to call for any further questions or concerns.

## 2019-08-05 ENCOUNTER — Encounter: Payer: Self-pay | Admitting: Gastroenterology

## 2019-08-05 ENCOUNTER — Ambulatory Visit: Payer: BC Managed Care – PPO | Admitting: Gastroenterology

## 2019-08-05 DIAGNOSIS — K921 Melena: Secondary | ICD-10-CM

## 2019-08-10 ENCOUNTER — Other Ambulatory Visit: Payer: Self-pay

## 2019-08-10 ENCOUNTER — Ambulatory Visit (INDEPENDENT_AMBULATORY_CARE_PROVIDER_SITE_OTHER): Payer: BC Managed Care – PPO | Admitting: Cardiology

## 2019-08-10 ENCOUNTER — Encounter: Payer: Self-pay | Admitting: Cardiology

## 2019-08-10 VITALS — BP 118/80 | HR 75 | Ht 62.0 in | Wt 164.8 lb

## 2019-08-10 DIAGNOSIS — R002 Palpitations: Secondary | ICD-10-CM | POA: Diagnosis not present

## 2019-08-10 DIAGNOSIS — I493 Ventricular premature depolarization: Secondary | ICD-10-CM | POA: Diagnosis not present

## 2019-08-10 MED ORDER — METOPROLOL SUCCINATE ER 25 MG PO TB24: 12.5000 mg | ORAL_TABLET | ORAL | Status: DC

## 2019-08-10 NOTE — Progress Notes (Signed)
Cardiology Office Note:    Date:  08/10/2019   ID:  Melanie Valdez, DOB 05/26/1991, MRN [id number]  PCP:  Melanie Valdez, Melanie M, PA-C  Cardiologist:  Melanie OdeaBrian Agbor-Etang, MD  Electrophysiologist:  None   Referring MD: Melanie Valdez, Melanie M, PA-C   Chief Complaint  Patient presents with  . other    Pt 1-2 month f/u Pt has swelling in ankles/feet and chest pain. Meds verbally reviewed w/ pt.     History of Present Illness:    Melanie Valdez is a [age] y.o. female with a hx of asthma, who presents for follow-up.  She was originally seen due to palpitations.  Patient states symptoms are not related to exertion.symptoms occur  multiple times a day lasting for a couple of minutes.  She denies chest pain with exertion, nausea, vomiting, diarrhea, orthopnea, edema.  Cardiac monitor did reveal isolated ventricular ectopies.  Symptoms were associated with ventricular ectopy.  No significant cardiac arrhythmias noted.Toprol-XL was started with some improvement in her symptoms of palpitations.  Beta-blocker sometimes causes her heart rate to be low in the 50s and associated dizziness.  Echocardiogram showed normal systolic and diastolic function.  Mild MR.  Exercise tolerance test was negative for inducible ischemia.    Past Medical History:  Diagnosis Date  . Asthma   . Complication of anesthesia   . PVC's (premature ventricular contractions)     Past Surgical History:  Procedure Laterality Date  . BREAST REDUCTION SURGERY    . BREAST SURGERY    . CESAREAN SECTION  2018  . COLONOSCOPY WITH PROPOFOL N/A 06/16/2019   Procedure: COLONOSCOPY WITH PROPOFOL;  Surgeon: Melanie Valdez, Melanie B, MD;  Location: [facility] ENDOSCOPY;  Service: Endoscopy;  Laterality: N/A;  . LEEP  2015  . TONSILLECTOMY    . TONSILLECTOMY AND ADENOIDECTOMY  1999  . WISDOM TOOTH EXTRACTION  2009    Current Medications: Current Meds  Medication Sig  . albuterol (VENTOLIN HFA) 108 (90 Base) MCG/ACT inhaler Inhale 1-2 puffs into the  lungs every 6 (six) hours as needed.   . Cetirizine HCl (ZYRTEC ALLERGY) 10 MG CAPS Take 5 mg by mouth daily.   Marland Kitchen. escitalopram (LEXAPRO) 10 MG tablet Take 1 tablet (10 mg total) by mouth daily.  Marland Kitchen. etonogestrel (NEXPLANON) 68 MG IMPL implant 1 each by Subdermal route once.  . fexofenadine (ALLEGRA) 180 MG tablet Take 180 mg by mouth daily.  . fluticasone (FLONASE) 50 MCG/ACT nasal spray Place 2 sprays into both nostrils daily.  Marland Kitchen. levalbuterol (XOPENEX) 0.31 MG/3ML nebulizer solution Inhale into the lungs.  . loperamide (IMODIUM A-D) 2 MG tablet Take 1 tablet (2 mg total) by mouth 4 (four) times daily as needed for up to 30 doses for diarrhea or loose stools.  . metoprolol succinate (TOPROL-XL) 25 MG 24 hr tablet Take 0.5 tablets (12.5 mg total) by mouth every other day. Take with or immediately following a meal.  . [DISCONTINUED] metoprolol succinate (TOPROL-XL) 25 MG 24 hr tablet Take 0.5 tablets (12.5 mg total) by mouth daily. Take with or immediately following a meal.     Allergies:   Codeine, Sulfa antibiotics, and Hydrocodone-acetaminophen   Social History   Socioeconomic History  . Marital status: Married    Spouse name: Not on file  . Number of children: Not on file  . Years of education: Not on file  . Highest education level: Not on file  Occupational History  . Not on file  Tobacco Use  . Smoking status: Never Smoker  .  Smokeless tobacco: Never Used  Substance and Sexual Activity  . Alcohol use: Yes    Alcohol/week: 1.0 standard drinks    Types: 1 Glasses of wine per week  . Drug use: Never  . Sexual activity: Not on file  Other Topics Concern  . Not on file  Social History Narrative  . Not on file   Social Determinants of Health   Financial Resource Strain:   . Difficulty of Paying Living Expenses: Not on file  Food Insecurity:   . Worried About Programme researcher, broadcasting/film/video in the Last Year: Not on file  . Ran Out of Food in the Last Year: Not on file  Transportation  Needs:   . Lack of Transportation (Medical): Not on file  . Lack of Transportation (Non-Medical): Not on file  Physical Activity:   . Days of Exercise per Week: Not on file  . Minutes of Exercise per Session: Not on file  Stress:   . Feeling of Stress : Not on file  Social Connections:   . Frequency of Communication with Friends and Family: Not on file  . Frequency of Social Gatherings with Friends and Family: Not on file  . Attends Religious Services: Not on file  . Active Member of Clubs or Organizations: Not on file  . Attends Banker Meetings: Not on file  . Marital Status: Not on file     Family History: The patient's family history includes Heart disease in her maternal grandfather; Hypertension in her maternal grandmother; Kidney disease in her maternal grandmother.  ROS:   Please see the history of present illness.     All other systems reviewed and are negative.  EKGs/Labs/Other Studies Reviewed:    The following studies were reviewed today: ETT 07/28/2019 Conclusion:  Exercise stress test was optimal for stated age.  Negative for inducible ischemia.  TTE 07/02/2019  1. Left ventricular ejection fraction, by visual estimation, is 60 to 65%. The left ventricle has normal function. There is no left ventricular hypertrophy.  2. Global right ventricle has normal systolic function.The right ventricular size is normal. No increase in right ventricular wall thickness.  3. Left atrial size was normal.  4. Normal pulmonary artery systolic pressure.  2-week cardiac monitor date 05/01/2019 Patient had a min HR of 51 bpm, max HR of 154 bpm, and avg HR of 88 bpm. Predominant underlying rhythm was Sinus Rhythm. Isolated SVEs were rare (<1.0%), SVE Couplets were rare (<1.0%), and no SVE Triplets were present. Isolated VEs were rare (<1.0%), VE Couplets were rare (<1.0%), and no VE Triplets were present.   EKG:  EKG is  ordered today.  The ekg ordered today  demonstrates normal sinus rhythm, sinus arrhythmia, otherwise normal ECG.  Recent Labs: 05/01/2019: ALT 23; BUN 11; Creatinine, Ser 0.93; Hemoglobin 12.5; Platelets 169; Potassium 4.3; Sodium 142; TSH 2.930 07/06/2019: Magnesium 1.9  Recent Lipid Panel    Component Value Date/Time   CHOL 233 (H) 04/04/2018 1443   TRIG 112 04/04/2018 1443   HDL 52 04/04/2018 1443   CHOLHDL 4.5 (H) 04/04/2018 1443   LDLCALC 159 (H) 04/04/2018 1443    Physical Exam:    VS:  BP 118/80 (BP Location: Left Arm, Patient Position: Sitting, Cuff Size: Normal)   Pulse 75   Ht 5\' 2"  (1.575 Valdez)   Wt 164 lb 12.8 oz (74.8 kg)   SpO2 99%   BMI 30.14 kg/Valdez     Wt Readings from Last 3 Encounters:  08/10/19 164  lb 12.8 oz (74.8 kg)  07/06/19 161 lb 8 oz (73.3 kg)  06/16/19 152 lb (68.9 kg)     GEN:  Well nourished, well developed in no acute distress HEENT: Normal NECK: No JVD; No carotid bruits LYMPHATICS: No lymphadenopathy CARDIAC: RRR, no murmurs, rubs, gallops RESPIRATORY:  Clear to auscultation without rales, wheezing or rhonchi  ABDOMEN: Soft, non-tender, non-distended MUSCULOSKELETAL:  No edema; No deformity  SKIN: Warm and dry NEUROLOGIC:  Alert and oriented x 3 PSYCHIATRIC:  Normal affect   ASSESSMENT:   No significant arrhythmias noted on cardiac monitor, agitations improved somewhat on beta-blocker although heart rate is in the low 50s at times.  Echocardiogram with mild MR otherwise normal study.  Exercise tolerance test was negative for inducible ischemia.  1. PVC's (premature ventricular contractions)   2. Palpitations    PLAN:    In order of problems listed above:  Decrease Toprol-XL to 12.5 mg every other day.  Patient reassured on findings of echocardiogram, ETT, cardiac monitor.  If symptoms of dizziness persist, she can stop the beta-blocker.  Follow-up in 6 months .  This note was generated in part or whole with voice recognition software. Voice recognition is usually quite  accurate but there are transcription errors that can and very often do occur. I apologize for any typographical errors that were not detected and corrected.  Medication Adjustments/Labs and Tests Ordered: Current medicines are reviewed at length with the patient today.  Concerns regarding medicines are outlined above.  Orders Placed This Encounter  Procedures  . EKG 12-Lead   Meds ordered this encounter  Medications  . metoprolol succinate (TOPROL-XL) 25 MG 24 hr tablet    Sig: Take 0.5 tablets (12.5 mg total) by mouth every other day. Take with or immediately following a meal.    Patient Instructions  Medication Instructions:  Your physician has recommended you make the following change in your medication:   REDUCE Metoprolol Succinate 12.5 mg to every other day.  *If you need a refill on your cardiac medications before your next appointment, please call your pharmacy*  Lab Work: None ordered If you have labs (blood work) drawn today and your tests are completely normal, you will receive your results only by: Marland Kitchen MyChart Message (if you have MyChart) OR . A paper copy in the mail If you have any lab test that is abnormal or we need to change your treatment, we will call you to review the results.  Testing/Procedures: None ordered  Follow-Up: At [facility], you and your health needs are our priority.  As part of our continuing mission to provide you with exceptional heart care, we have created designated Provider Care Teams.  These Care Teams include your primary Cardiologist (physician) and Advanced Practice Providers (APPs -  Physician Assistants and Nurse Practitioners) who all work together to provide you with the care you need, when you need it.  Your next appointment:   6 month(s)  The format for your next appointment:   In Person  Provider:    You may see Kate Sable, MD or one of the following Advanced Practice Providers on your designated Care Team:     Murray Hodgkins, NP  Christell Faith, PA-C  Marrianne Mood, PA-C   Other Instructions N/A     Signed, Kate Sable, MD  08/10/2019 3:11 PM    [place]

## 2019-08-10 NOTE — Patient Instructions (Signed)
Medication Instructions:  Your physician has recommended you make the following change in your medication:   REDUCE Metoprolol Succinate 12.5 mg to every other day.  *If you need a refill on your cardiac medications before your next appointment, please call your pharmacy*  Lab Work: None ordered If you have labs (blood work) drawn today and your tests are completely normal, you will receive your results only by: Marland Kitchen MyChart Message (if you have MyChart) OR . A paper copy in the mail If you have any lab test that is abnormal or we need to change your treatment, we will call you to review the results.  Testing/Procedures: None ordered  Follow-Up: At [facility], you and your health needs are our priority.  As part of our continuing mission to provide you with exceptional heart care, we have created designated Provider Care Teams.  These Care Teams include your primary Cardiologist (physician) and Advanced Practice Providers (APPs -  Physician Assistants and Nurse Practitioners) who all work together to provide you with the care you need, when you need it.  Your next appointment:   6 month(s)  The format for your next appointment:   In Person  Provider:    You may see Kate Sable, MD or one of the following Advanced Practice Providers on your designated Care Team:    Murray Hodgkins, NP  Christell Faith, PA-C  Marrianne Mood, PA-C   Other Instructions N/A

## 2019-09-05 DIAGNOSIS — Z20828 Contact with and (suspected) exposure to other viral communicable diseases: Secondary | ICD-10-CM | POA: Diagnosis not present

## 2019-09-05 DIAGNOSIS — Z20822 Contact with and (suspected) exposure to covid-19: Secondary | ICD-10-CM | POA: Diagnosis not present

## 2019-10-12 ENCOUNTER — Other Ambulatory Visit: Payer: Self-pay | Admitting: Physician Assistant

## 2019-10-12 DIAGNOSIS — F32A Depression, unspecified: Secondary | ICD-10-CM

## 2019-10-12 DIAGNOSIS — F329 Major depressive disorder, single episode, unspecified: Secondary | ICD-10-CM

## 2019-10-12 DIAGNOSIS — F419 Anxiety disorder, unspecified: Secondary | ICD-10-CM

## 2019-12-16 ENCOUNTER — Ambulatory Visit (INDEPENDENT_AMBULATORY_CARE_PROVIDER_SITE_OTHER): Payer: BC Managed Care – PPO | Admitting: Physician Assistant

## 2019-12-16 ENCOUNTER — Encounter: Payer: Self-pay | Admitting: Physician Assistant

## 2019-12-16 ENCOUNTER — Other Ambulatory Visit: Payer: Self-pay

## 2019-12-16 VITALS — BP 128/76 | HR 80 | Temp 97.5°F | Ht 62.0 in | Wt 168.0 lb

## 2019-12-16 DIAGNOSIS — Z114 Encounter for screening for human immunodeficiency virus [HIV]: Secondary | ICD-10-CM | POA: Diagnosis not present

## 2019-12-16 DIAGNOSIS — F419 Anxiety disorder, unspecified: Secondary | ICD-10-CM | POA: Diagnosis not present

## 2019-12-16 DIAGNOSIS — Z6830 Body mass index (BMI) 30.0-30.9, adult: Secondary | ICD-10-CM

## 2019-12-16 DIAGNOSIS — E7800 Pure hypercholesterolemia, unspecified: Secondary | ICD-10-CM | POA: Diagnosis not present

## 2019-12-16 DIAGNOSIS — F329 Major depressive disorder, single episode, unspecified: Secondary | ICD-10-CM

## 2019-12-16 DIAGNOSIS — Z1389 Encounter for screening for other disorder: Secondary | ICD-10-CM | POA: Diagnosis not present

## 2019-12-16 DIAGNOSIS — K589 Irritable bowel syndrome without diarrhea: Secondary | ICD-10-CM | POA: Diagnosis not present

## 2019-12-16 DIAGNOSIS — Z0000 Encounter for general adult medical examination without abnormal findings: Secondary | ICD-10-CM

## 2019-12-16 DIAGNOSIS — F32A Depression, unspecified: Secondary | ICD-10-CM

## 2019-12-16 DIAGNOSIS — Z124 Encounter for screening for malignant neoplasm of cervix: Secondary | ICD-10-CM | POA: Diagnosis not present

## 2019-12-16 DIAGNOSIS — E669 Obesity, unspecified: Secondary | ICD-10-CM | POA: Diagnosis not present

## 2019-12-16 LAB — POCT URINALYSIS DIPSTICK
Bilirubin, UA: NEGATIVE
Blood, UA: NEGATIVE
Glucose, UA: NEGATIVE
Ketones, UA: NEGATIVE
Leukocytes, UA: NEGATIVE
Nitrite, UA: NEGATIVE
Protein, UA: NEGATIVE
Spec Grav, UA: 1.02 (ref 1.010–1.025)
Urobilinogen, UA: 0.2 E.U./dL
pH, UA: 6.5 (ref 5.0–8.0)

## 2019-12-16 LAB — POCT UA - MICROALBUMIN: Microalbumin Ur, POC: 20 mg/L

## 2019-12-16 MED ORDER — AMITRIPTYLINE HCL 10 MG PO TABS: 10.0000 mg | ORAL_TABLET | Freq: Every day | ORAL | 0 refills | Status: DC

## 2019-12-16 NOTE — Progress Notes (Signed)
Complete physical exam   Patient: Melanie Valdez   DOB: [birth year]   28 y.o. Female  MRN: [id number] Visit Date: 12/16/2019  Today's healthcare provider: Trey Sailors, PA-C   Chief Complaint  Patient presents with  . Annual Exam  . Anxiety  . Depression   Subjective    Melanie Valdez is a 29 y.o. female who presents today for a complete physical exam.  She reports consuming a general diet. Home exercise routine includes walking 1 hrs per day. She generally feels well. She reports sleeping well. She does have additional problems to discuss today. Weight gain.  HPI   Weight Loss Patient reports she would like to loss weight. She states that she still has the same weight from when she was 9 months pregnant. Advise she can see a nutritionist and weight loss clinic. Breakfast: oatmeal and protein shake Beverages: water  Anxiety & Depression Patient presents for anxiety and depression. Patient last office visit was on 04/23/2019. She is currently taking Lexapro 10 MG daily and reports good compliance with treatment.  Palpitations: Was seen by cardiology and prescribed metoprolol but this cause bradycardia and was thus discontinued.  Diarrhea: Colonoscopy 07/2019 was normal and did not explain diarrhea. Continues with multiple diarrhea episodes and bloating. Impression from GI doctor was that it might be IBS.   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  GAD 7 : Generalized Anxiety Score 12/16/2019  Nervous, Anxious, on Edge 3  Control/stop worrying 0  Worry too much - different things 1  Trouble relaxing 1  Restless 2  Easily annoyed or irritable 1  Afraid - awful might happen 0  Total GAD 7 Score 8  Anxiety Difficulty Somewhat difficult   Depression screen [facility] 2/9 12/16/2019 02/03/2019  Decreased Interest 0 1  Down, Depressed, Hopeless 0 1  PHQ - 2 Score 0 2  Altered sleeping 0 0  Tired, decreased energy 1 2  Change in appetite 1 0  Feeling bad or failure about  yourself  0 0  Trouble concentrating 2 0  Moving slowly or fidgety/restless 0 0  Suicidal thoughts 0 0  PHQ-9 Score 4 4  Difficult doing work/chores Not difficult at all Somewhat difficult          Past Medical History:  Diagnosis Date  . Asthma   . Complication of anesthesia   . PVC's (premature ventricular contractions)    Past Surgical History:  Procedure Laterality Date  . BREAST REDUCTION SURGERY    . BREAST SURGERY    . CESAREAN SECTION  2018  . COLONOSCOPY WITH PROPOFOL N/A 06/16/2019   Procedure: COLONOSCOPY WITH PROPOFOL;  Surgeon: Pasty Spillers, MD;  Location: [facility] ENDOSCOPY;  Service: Endoscopy;  Laterality: N/A;  . LEEP  2015  . TONSILLECTOMY    . TONSILLECTOMY AND ADENOIDECTOMY  1999  . WISDOM TOOTH EXTRACTION  2009   Social History   Socioeconomic History  . Marital status: Married    Spouse name: Not on file  . Number of children: Not on file  . Years of education: Not on file  . Highest education level: Not on file  Occupational History  . Not on file  Tobacco Use  . Smoking status: Never Smoker  . Smokeless tobacco: Never Used  Substance and Sexual Activity  . Alcohol use: Yes    Alcohol/week: 1.0 standard drinks    Types: 1 Glasses of wine per week  . Drug use: Never  . Sexual  activity: Not on file  Other Topics Concern  . Not on file  Social History Narrative  . Not on file   Social Determinants of Health   Financial Resource Strain:   . Difficulty of Paying Living Expenses:   Food Insecurity:   . Worried About Charity fundraiser in the Last Year:   . Arboriculturist in the Last Year:   Transportation Needs:   . Film/video editor (Medical):   Marland Kitchen Lack of Transportation (Non-Medical):   Physical Activity:   . Days of Exercise per Week:   . Minutes of Exercise per Session:   Stress:   . Feeling of Stress :   Social Connections:   . Frequency of Communication with Friends and Family:   . Frequency of Social Gatherings  with Friends and Family:   . Attends Religious Services:   . Active Member of Clubs or Organizations:   . Attends Archivist Meetings:   Marland Kitchen Marital Status:   Intimate Partner Violence:   . Fear of Current or Ex-Partner:   . Emotionally Abused:   Marland Kitchen Physically Abused:   . Sexually Abused:    Family Status  Relation Name Status  . Mother  Alive  . Father  Alive  . MGM  Alive  . MGF  Deceased   Family History  Problem Relation Age of Onset  . Hypertension Maternal Grandmother   . Kidney disease Maternal Grandmother   . Heart disease Maternal Grandfather    Allergies  Allergen Reactions  . Codeine   . Sulfa Antibiotics   . Hydrocodone-Acetaminophen Itching    Other reaction(s): mild rash/itching    Patient Care Team: Paulene Floor as PCP - General (Physician Assistant) Kate Sable, MD as PCP - Cardiology (Cardiology)   Medications: Outpatient Medications Prior to Visit  Medication Sig  . albuterol (VENTOLIN HFA) 108 (90 Base) MCG/ACT inhaler Inhale 1-2 puffs into the lungs every 6 (six) hours as needed.   . Cetirizine HCl (ZYRTEC ALLERGY) 10 MG CAPS Take 5 mg by mouth daily.   Marland Kitchen escitalopram (LEXAPRO) 10 MG tablet TAKE 1 TABLET BY MOUTH EVERY DAY  . etonogestrel (NEXPLANON) 68 MG IMPL implant 1 each by Subdermal route once.  . fexofenadine (ALLEGRA) 180 MG tablet Take 180 mg by mouth daily.  . fluticasone (FLONASE) 50 MCG/ACT nasal spray Place 2 sprays into both nostrils daily.  Marland Kitchen levalbuterol (XOPENEX) 0.31 MG/3ML nebulizer solution Inhale into the lungs.  . loperamide (IMODIUM A-D) 2 MG tablet Take 1 tablet (2 mg total) by mouth 4 (four) times daily as needed for up to 30 doses for diarrhea or loose stools.  . metoprolol succinate (TOPROL-XL) 25 MG 24 hr tablet Take 0.5 tablets (12.5 mg total) by mouth every other day. Take with or immediately following a meal.   No facility-administered medications prior to visit.    Review of Systems    Constitutional: Positive for appetite change and unexpected weight change.  HENT: Negative.   Eyes: Negative.   Respiratory: Negative.   Cardiovascular: Negative.   Gastrointestinal: Positive for abdominal distention, anal bleeding and blood in stool.  Endocrine: Positive for polyuria.  Genitourinary: Positive for difficulty urinating.  Musculoskeletal: Negative.   Skin: Negative.   Allergic/Immunologic: Positive for environmental allergies.  Neurological: Negative.   Hematological: Negative.   Psychiatric/Behavioral: Positive for decreased concentration. The patient is hyperactive.       Objective    BP 128/76 (BP Location: Right Arm, Patient  Position: Sitting, Cuff Size: Normal)   Pulse 80   Temp (!) 97.5 F (36.4 C) (Temporal)   Ht 5\' 2"  (1.575 m)   Wt 168 lb (76.2 kg)   LMP 12/06/2019 (Exact Date)   SpO2 99%   BMI 30.73 kg/m    Physical Exam Constitutional:      Appearance: Normal appearance.  HENT:     Right Ear: Tympanic membrane, ear canal and external ear normal.     Left Ear: Tympanic membrane, ear canal and external ear normal.  Cardiovascular:     Rate and Rhythm: Normal rate and regular rhythm.     Pulses: Normal pulses.     Heart sounds: Normal heart sounds.  Pulmonary:     Effort: Pulmonary effort is normal.     Breath sounds: Normal breath sounds.  Abdominal:     General: Abdomen is flat. Bowel sounds are normal.     Palpations: Abdomen is soft.  Musculoskeletal:     Cervical back: Normal range of motion.  Skin:    General: Skin is warm and dry.  Neurological:     General: No focal deficit present.     Mental Status: She is alert and oriented to person, place, and time.  Psychiatric:        Mood and Affect: Mood normal.        Behavior: Behavior normal.       Depression Screen  PHQ 2/9 Scores 12/16/2019 02/03/2019  PHQ - 2 Score 0 2  PHQ- 9 Score 4 4   Fall Risk  12/16/2019  Falls in the past year? 0  Number falls in past yr: 0  Injury  with Fall? 0     Office Visit from 12/16/2019 in [facility]  AUDIT-C Score  2     Functional Status Survey: Is the patient deaf or have difficulty hearing?: No Does the patient have difficulty seeing, even when wearing glasses/contacts?: No Does the patient have difficulty concentrating, remembering, or making decisions?: Yes(concentrating per pt) Does the patient have difficulty walking or climbing stairs?: No Does the patient have difficulty dressing or bathing?: No Does the patient have difficulty doing errands alone such as visiting a doctor's office or shopping?: No   Results for orders placed or performed in visit on 12/16/19  POCT urinalysis dipstick  Result Value Ref Range   Color, UA Golden Yellow    Clarity, UA Clear    Glucose, UA Negative Negative   Bilirubin, UA Negative    Ketones, UA Negative    Spec Grav, UA 1.020 1.010 - 1.025   Blood, UA Negative    pH, UA 6.5 5.0 - 8.0   Protein, UA Negative Negative   Urobilinogen, UA 0.2 0.2 or 1.0 E.U./dL   Nitrite, UA Negative    Leukocytes, UA Negative Negative   Appearance     Odor    POCT UA - Microalbumin  Result Value Ref Range   Microalbumin Ur, POC 20 mg/L   Creatinine, POC     Albumin/Creatinine Ratio, Urine, POC      Assessment & Plan    1. 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   - CBC with Differential/Platelet - Comprehensive metabolic panel - Lipid Panel With LDL/HDL Ratio - TSH - HIV Antibody (routine testing w rflx)  2. Anxiety and depression Continue Lexapro.  3. Encounter for screening for HIV  - HIV Antibody (routine testing w rflx)  4. Irritable bowel syndrome, unspecified type  - amitriptyline (ELAVIL) 10 MG tablet; Take 1 tablet (10 mg total) by mouth at bedtime.  Dispense: 90 tablet; Refill: 0  5. Screening for blood or protein in urine  - POCT urinalysis dipstick - POCT UA - Microalbumin  6. Class 1 obesity  without serious comorbidity with body mass index (BMI) of 30.0 to 30.9 in adult, unspecified obesity type  - Amb Ref to Medical Weight Management  Routine Health Maintenance and Physical Exam  Exercise Activities and Dietary recommendations Goals   None     Immunization History  Administered Date(s) Administered  . Influenza,inj,Quad PF,6+ Mos 05/01/2017  . Influenza-Unspecified 04/28/2018  . PFIZER SARS-COV-2 Vaccination 11/05/2019, 11/26/2019  . Tdap 05/01/2017, 09/15/2018    Health Maintenance  Topic Date Due  . HIV Screening  Never done  . PAP SMEAR-Modifier  Never done  . INFLUENZA VACCINE  03/13/2020  . TETANUS/TDAP  09/15/2028  . COVID-19 Vaccine  Completed    Discussed health benefits of physical activity, and encouraged her to engage in regular exercise appropriate for her age and condition.    No follow-ups on file.     ITrey Sailors, PA-C, have reviewed all documentation for this visit. The documentation on 12/16/19 for the exam, diagnosis, procedures, and orders are all accurate and complete.    Maryella Shivers  [facility] [phone] (phone) [phone] (fax)  [facility]

## 2019-12-16 NOTE — Patient Instructions (Signed)
Health Maintenance, Female Adopting a healthy lifestyle and getting preventive care are important in promoting health and wellness. Ask your health care provider about:  The right schedule for you to have regular tests and exams.  Things you can do on your own to prevent diseases and keep yourself healthy. What should I know about diet, weight, and exercise? Eat a healthy diet   Eat a diet that includes plenty of vegetables, fruits, low-fat dairy products, and lean protein.  Do not eat a lot of foods that are high in solid fats, added sugars, or sodium. Maintain a healthy weight Body mass index (BMI) is used to identify weight problems. It estimates body fat based on height and weight. Your health care provider can help determine your BMI and help you achieve or maintain a healthy weight. Get regular exercise Get regular exercise. This is one of the most important things you can do for your health. Most adults should:  Exercise for at least 150 minutes each week. The exercise should increase your heart rate and make you sweat (moderate-intensity exercise).  Do strengthening exercises at least twice a week. This is in addition to the moderate-intensity exercise.  Spend less time sitting. Even light physical activity can be beneficial. Watch cholesterol and blood lipids Have your blood tested for lipids and cholesterol at 31 years of age, then have this test every 5 years. Have your cholesterol levels checked more often if:  Your lipid or cholesterol levels are high.  You are older than 31 years of age.  You are at high risk for heart disease. What should I know about cancer screening? Depending on your health history and family history, you may need to have cancer screening at various ages. This may include screening for:  Breast cancer.  Cervical cancer.  Colorectal cancer.  Skin cancer.  Lung cancer. What should I know about heart disease, diabetes, and high blood  pressure? Blood pressure and heart disease  High blood pressure causes heart disease and increases the risk of stroke. This is more likely to develop in people who have high blood pressure readings, are of African descent, or are overweight.  Have your blood pressure checked: ? Every 3-5 years if you are 18-39 years of age. ? Every year if you are 40 years old or older. Diabetes Have regular diabetes screenings. This checks your fasting blood sugar level. Have the screening done:  Once every three years after age 40 if you are at a normal weight and have a low risk for diabetes.  More often and at a younger age if you are overweight or have a high risk for diabetes. What should I know about preventing infection? Hepatitis B If you have a higher risk for hepatitis B, you should be screened for this virus. Talk with your health care provider to find out if you are at risk for hepatitis B infection. Hepatitis C Testing is recommended for:  Everyone born from 1945 through 1965.  Anyone with known risk factors for hepatitis C. Sexually transmitted infections (STIs)  Get screened for STIs, including gonorrhea and chlamydia, if: ? You are sexually active and are younger than 31 years of age. ? You are older than 31 years of age and your health care provider tells you that you are at risk for this type of infection. ? Your sexual activity has changed since you were last screened, and you are at increased risk for chlamydia or gonorrhea. Ask your health care provider if   you are at risk.  Ask your health care provider about whether you are at high risk for HIV. Your health care provider may recommend a prescription medicine to help prevent HIV infection. If you choose to take medicine to prevent HIV, you should first get tested for HIV. You should then be tested every 3 months for as long as you are taking the medicine. Pregnancy  If you are about to stop having your period (premenopausal) and  you may become pregnant, seek counseling before you get pregnant.  Take 400 to 800 micrograms (mcg) of folic acid every day if you become pregnant.  Ask for birth control (contraception) if you want to prevent pregnancy. Osteoporosis and menopause Osteoporosis is a disease in which the bones lose minerals and strength with aging. This can result in bone fractures. If you are 65 years old or older, or if you are at risk for osteoporosis and fractures, ask your health care provider if you should:  Be screened for bone loss.  Take a calcium or vitamin D supplement to lower your risk of fractures.  Be given hormone replacement therapy (HRT) to treat symptoms of menopause. Follow these instructions at home: Lifestyle  Do not use any products that contain nicotine or tobacco, such as cigarettes, e-cigarettes, and chewing tobacco. If you need help quitting, ask your health care provider.  Do not use street drugs.  Do not share needles.  Ask your health care provider for help if you need support or information about quitting drugs. Alcohol use  Do not drink alcohol if: ? Your health care provider tells you not to drink. ? You are pregnant, may be pregnant, or are planning to become pregnant.  If you drink alcohol: ? Limit how much you use to 0-1 drink a day. ? Limit intake if you are breastfeeding.  Be aware of how much alcohol is in your drink. In the [place] one drink equals one 12 oz bottle of beer (355 mL), one 5 oz glass of wine (148 mL), or one 1 oz glass of hard liquor (44 mL). General instructions  Schedule regular health, dental, and eye exams.  Stay current with your vaccines.  Tell your health care provider if: ? You often feel depressed. ? You have ever been abused or do not feel safe at home. Summary  Adopting a healthy lifestyle and getting preventive care are important in promoting health and wellness.  Follow your health care provider's instructions about healthy  diet, exercising, and getting tested or screened for diseases.  Follow your health care provider's instructions on monitoring your cholesterol and blood pressure. This information is not intended to replace advice given to you by your health care provider. Make sure you discuss any questions you have with your health care provider. Document Revised: 07/23/2018 Document Reviewed: 07/23/2018 Elsevier Patient Education  2020 Elsevier Inc.  

## 2019-12-17 ENCOUNTER — Telehealth: Payer: Self-pay | Admitting: Physician Assistant

## 2019-12-17 LAB — CBC WITH DIFFERENTIAL/PLATELET
Basophils Absolute: 0 10*3/uL (ref 0.0–0.2)
Basos: 1 %
EOS (ABSOLUTE): 0.1 10*3/uL (ref 0.0–0.4)
Eos: 3 %
Hematocrit: 40 % (ref 34.0–46.6)
Hemoglobin: 12.9 g/dL (ref 11.1–15.9)
Immature Grans (Abs): 0 10*3/uL (ref 0.0–0.1)
Immature Granulocytes: 0 %
Lymphocytes Absolute: 1.6 10*3/uL (ref 0.7–3.1)
Lymphs: 31 %
MCH: 28.5 pg (ref 26.6–33.0)
MCHC: 32.3 g/dL (ref 31.5–35.7)
MCV: 88 fL (ref 79–97)
Monocytes Absolute: 0.4 10*3/uL (ref 0.1–0.9)
Monocytes: 8 %
Neutrophils Absolute: 3 10*3/uL (ref 1.4–7.0)
Neutrophils: 57 %
Platelets: 203 10*3/uL (ref 150–450)
RBC: 4.53 x10E6/uL (ref 3.77–5.28)
RDW: 13 % (ref 11.7–15.4)
WBC: 5.2 10*3/uL (ref 3.4–10.8)

## 2019-12-17 LAB — TSH: TSH: 2.47 u[IU]/mL (ref 0.450–4.500)

## 2019-12-17 LAB — COMPREHENSIVE METABOLIC PANEL
ALT: 13 IU/L (ref 0–32)
AST: 14 IU/L (ref 0–40)
Albumin/Globulin Ratio: 1.7 (ref 1.2–2.2)
Albumin: 4.5 g/dL (ref 3.9–5.0)
Alkaline Phosphatase: 74 IU/L (ref 39–117)
BUN/Creatinine Ratio: 15 (ref 9–23)
BUN: 12 mg/dL (ref 6–20)
Bilirubin Total: 1.2 mg/dL (ref 0.0–1.2)
CO2: 23 mmol/L (ref 20–29)
Calcium: 9.4 mg/dL (ref 8.7–10.2)
Chloride: 101 mmol/L (ref 96–106)
Creatinine, Ser: 0.82 mg/dL (ref 0.57–1.00)
GFR calc Af Amer: 111 mL/min/{1.73_m2} (ref >59)
GFR calc non Af Amer: 96 mL/min/{1.73_m2} (ref >59)
Globulin, Total: 2.7 g/dL (ref 1.5–4.5)
Glucose: 82 mg/dL (ref 65–99)
Potassium: 4 mmol/L (ref 3.5–5.2)
Sodium: 139 mmol/L (ref 134–144)
Total Protein: 7.2 g/dL (ref 6.0–8.5)

## 2019-12-17 LAB — LIPID PANEL WITH LDL/HDL RATIO
Cholesterol, Total: 203 mg/dL — ABNORMAL HIGH (ref 100–199)
HDL: 40 mg/dL (ref >39)
LDL Chol Calc (NIH): 133 mg/dL — ABNORMAL HIGH (ref 0–99)
LDL/HDL Ratio: 3.3 ratio — ABNORMAL HIGH (ref 0.0–3.2)
Triglycerides: 165 mg/dL — ABNORMAL HIGH (ref 0–149)
VLDL Cholesterol Cal: 30 mg/dL (ref 5–40)

## 2019-12-17 LAB — HIV ANTIBODY (ROUTINE TESTING W REFLEX): HIV Screen 4th Generation wRfx: NONREACTIVE

## 2019-12-17 NOTE — Telephone Encounter (Signed)
Can we abstract PAP smear from [facility] 02/2018? Thanks.

## 2019-12-17 NOTE — Telephone Encounter (Signed)
Pap was abstracted. 

## 2020-01-06 ENCOUNTER — Ambulatory Visit (INDEPENDENT_AMBULATORY_CARE_PROVIDER_SITE_OTHER): Payer: BC Managed Care – PPO | Admitting: Family Medicine

## 2020-01-20 ENCOUNTER — Ambulatory Visit (INDEPENDENT_AMBULATORY_CARE_PROVIDER_SITE_OTHER): Payer: BC Managed Care – PPO | Admitting: Family Medicine

## 2020-02-29 ENCOUNTER — Encounter: Payer: Self-pay | Admitting: Physician Assistant

## 2020-03-09 ENCOUNTER — Other Ambulatory Visit: Payer: Self-pay | Admitting: Physician Assistant

## 2020-03-09 DIAGNOSIS — K589 Irritable bowel syndrome without diarrhea: Secondary | ICD-10-CM

## 2020-03-09 NOTE — Telephone Encounter (Signed)
Requested Prescriptions  Pending Prescriptions Disp Refills   amitriptyline (ELAVIL) 10 MG tablet [Pharmacy Med Name: AMITRIPTYLINE HCL 10 MG TAB] 90 tablet 0    Sig: TAKE 1 TABLET BY MOUTH EVERYDAY AT BEDTIME     Psychiatry:  Antidepressants - Heterocyclics (TCAs) Passed - 03/09/2020  2:14 AM      Passed - Valid encounter within last 6 months    Recent Outpatient Visits          2 months ago Annual physical exam   [facility] Osvaldo Angst M, PA-C   10 months ago Anxiety and depression   [facility] Osvaldo Angst M, [place]   11 months ago Depression, unspecified depression type   [facility] [place], [place], [place]   1 year ago Depression, unspecified depression type   [facility] [place], [place], PA-C   1 year ago Anemia, unspecified type   [facility] [place], [place], [place]

## 2020-03-14 NOTE — Progress Notes (Signed)
Established patient visit   Patient: Melanie Valdez   DOB: 04/23/1991   29 y.o. Female  MRN: [id number] Visit Date: 03/16/2020  Today's healthcare provider: Trey Sailors, PA-C   Chief Complaint  Patient presents with  . Abdominal Pain   Subjective    Abdominal Pain This is a new problem. The current episode started 1 to 4 weeks ago. The problem occurs constantly. The problem has been unchanged. The pain is located in the RLQ. The pain is at a severity of 5/10. The pain is moderate. The quality of the pain is dull and sharp. The abdominal pain radiates to the back. Associated symptoms include diarrhea, nausea and vomiting. Pertinent negatives include no belching, constipation or fever. The pain is aggravated by certain positions, movement, bowel movement and eating. She has tried acetaminophen and antacids for the symptoms. The treatment provided mild relief.         Medications: Outpatient Medications Prior to Visit  Medication Sig  . albuterol (VENTOLIN HFA) 108 (90 Base) MCG/ACT inhaler Inhale 1-2 puffs into the lungs every 6 (six) hours as needed.   . Cetirizine HCl (ZYRTEC ALLERGY) 10 MG CAPS Take 5 mg by mouth daily.   Marland Kitchen escitalopram (LEXAPRO) 10 MG tablet TAKE 1 TABLET BY MOUTH EVERY DAY  . etonogestrel (NEXPLANON) 68 MG IMPL implant 1 each by Subdermal route once.  . fexofenadine (ALLEGRA) 180 MG tablet Take 180 mg by mouth daily.  . fluticasone (FLONASE) 50 MCG/ACT nasal spray Place 2 sprays into both nostrils daily.  Marland Kitchen levalbuterol (XOPENEX) 0.31 MG/3ML nebulizer solution Inhale into the lungs.  . loperamide (IMODIUM A-D) 2 MG tablet Take 1 tablet (2 mg total) by mouth 4 (four) times daily as needed for up to 30 doses for diarrhea or loose stools.  Marland Kitchen amitriptyline (ELAVIL) 10 MG tablet TAKE 1 TABLET BY MOUTH EVERYDAY AT BEDTIME  . metoprolol succinate (TOPROL-XL) 25 MG 24 hr tablet Take 0.5 tablets (12.5 mg total) by mouth every other day. Take with or immediately  following a meal.   No facility-administered medications prior to visit.    Review of Systems  Constitutional: Negative for fever.  Gastrointestinal: Positive for abdominal pain, diarrhea, nausea and vomiting. Negative for constipation.      Objective    BP 132/82 (BP Location: Left Arm, Patient Position: Sitting, Cuff Size: Normal)   Pulse 81   Temp 98.8 F (37.1 C) (Oral)   Wt 169 lb 6.4 oz (76.8 kg)   SpO2 97%   BMI 30.98 kg/m    Physical Exam Constitutional:      Appearance: Normal appearance.  Cardiovascular:     Rate and Rhythm: Normal rate and regular rhythm.     Pulses: Normal pulses.     Heart sounds: Normal heart sounds.  Pulmonary:     Effort: Pulmonary effort is normal.     Breath sounds: Normal breath sounds.  Abdominal:     General: Abdomen is flat. Bowel sounds are normal.     Palpations: Abdomen is soft.  Skin:    General: Skin is warm and dry.  Neurological:     General: No focal deficit present.     Mental Status: She is alert and oriented to person, place, and time.  Psychiatric:        Mood and Affect: Mood normal.        Behavior: Behavior normal.       Results for orders placed or performed in visit on 03/16/20  Amylase  Result Value Ref Range   Amylase 35 31 - 110 U/L  Lipase  Result Value Ref Range   Lipase 31 14 - 72 U/L  CBC with Differential/Platelet  Result Value Ref Range   WBC 3.9 3.4 - 10.8 x10E3/uL   RBC 4.62 3.77 - 5.28 x10E6/uL   Hemoglobin 13.3 11.1 - 15.9 g/dL   Hematocrit 33.2 95.1 - 46.6 %   MCV 88 79 - 97 fL   MCH 28.8 26.6 - 33.0 pg   MCHC 32.6 31 - 35 g/dL   RDW 88.4 16.6 - 06.3 %   Platelets 172 150 - 450 x10E3/uL   Neutrophils 53 Not Estab. %   Lymphs 34 Not Estab. %   Monocytes 9 Not Estab. %   Eos 3 Not Estab. %   Basos 1 Not Estab. %   Neutrophils Absolute 2.1 1 - 7 x10E3/uL   Lymphocytes Absolute 1.3 0 - 3 x10E3/uL   Monocytes Absolute 0.4 0 - 0 x10E3/uL   EOS (ABSOLUTE) 0.1 0.0 - 0.4 x10E3/uL    Basophils Absolute 0.0 0 - 0 x10E3/uL   Immature Granulocytes 0 Not Estab. %   Immature Grans (Abs) 0.0 0.0 - 0.1 x10E3/uL  Comprehensive metabolic panel  Result Value Ref Range   Glucose 87 65 - 99 mg/dL   BUN 15 6 - 20 mg/dL   Creatinine, Ser 0.16 0.57 - 1.00 mg/dL   GFR calc non Af Amer 79 >59 mL/min/1.73   GFR calc Af Amer 91 >59 mL/min/1.73   BUN/Creatinine Ratio 15 9 - 23   Sodium 140 134 - 144 mmol/L   Potassium 4.4 3.5 - 5.2 mmol/L   Chloride 102 96 - 106 mmol/L   CO2 23 20 - 29 mmol/L   Calcium 9.2 8.7 - 10.2 mg/dL   Total Protein 7.4 6.0 - 8.5 g/dL   Albumin 4.6 3.9 - 5.0 g/dL   Globulin, Total 2.8 1.5 - 4.5 g/dL   Albumin/Globulin Ratio 1.6 1.2 - 2.2   Bilirubin Total 0.9 0.0 - 1.2 mg/dL   Alkaline Phosphatase 77 48 - 121 IU/L   AST 16 0 - 40 IU/L   ALT 15 0 - 32 IU/L  QuantiFERON-TB Gold Plus  Result Value Ref Range   QuantiFERON Incubation Incubation performed.    QuantiFERON Criteria Comment    QuantiFERON TB1 Ag Value 0.04 IU/mL   QuantiFERON TB2 Ag Value 0.04 IU/mL   QuantiFERON Nil Value 0.04 IU/mL   QuantiFERON Mitogen Value >10.00 IU/mL   QuantiFERON-TB Gold Plus Negative Negative    Assessment & Plan    1. Abdominal pain, unspecified abdominal location  - Amylase - Lipase - CBC with Differential/Platelet - Comprehensive metabolic panel - ondansetron (ZOFRAN) 4 MG tablet; Take 1 tablet (4 mg total) by mouth every 8 (eight) hours as needed for nausea or vomiting.  Dispense: 20 tablet; Refill: 0 - US Abdomen Limited RUQ; Future  2. Screening for tuberculosis  - QuantiFERON-TB Gold Plus    Return if symptoms worsen or fail to improve.      ITrey Sailors, PA-C, have reviewed all documentation for this visit. The documentation on 04/12/20 for the exam, diagnosis, procedures, and orders are all accurate and complete.  The entirety of the information documented in the History of Present Illness, Review of Systems and Physical Exam were  personally obtained by me. Portions of this information were initially documented by Angelica Ran, CMA and reviewed by me for thoroughness and accuracy.  I spent 30 minutes dedicated to the care of this patient on the date of this encounter to include pre-visit review of records, face-to-face time with the patient discussing abdominal pain, and post visit ordering of testing.    Maryella Shivers  [facility] [phone] (phone) [phone] (fax)  [facility]

## 2020-03-16 ENCOUNTER — Other Ambulatory Visit: Payer: Self-pay

## 2020-03-16 ENCOUNTER — Ambulatory Visit (INDEPENDENT_AMBULATORY_CARE_PROVIDER_SITE_OTHER): Payer: BC Managed Care – PPO | Admitting: Physician Assistant

## 2020-03-16 VITALS — BP 132/82 | HR 81 | Temp 98.8°F | Wt 169.4 lb

## 2020-03-16 DIAGNOSIS — Z111 Encounter for screening for respiratory tuberculosis: Secondary | ICD-10-CM

## 2020-03-16 DIAGNOSIS — R109 Unspecified abdominal pain: Secondary | ICD-10-CM | POA: Diagnosis not present

## 2020-03-16 MED ORDER — ONDANSETRON HCL 4 MG PO TABS: 4.0000 mg | ORAL_TABLET | Freq: Three times a day (TID) | ORAL | 0 refills | Status: DC | PRN

## 2020-03-16 NOTE — Patient Instructions (Signed)
Abdominal Pain, Adult Pain in the abdomen (abdominal pain) can be caused by many things. Often, abdominal pain is not serious and it gets better with no treatment or by being treated at home. However, sometimes abdominal pain is serious. Your health care provider will ask questions about your medical history and do a physical exam to try to determine the cause of your abdominal pain. Follow these instructions at home:  Medicines  Take over-the-counter and prescription medicines only as told by your health care provider.  Do not take a laxative unless told by your health care provider. General instructions  Watch your condition for any changes.  Drink enough fluid to keep your urine pale yellow.  Keep all follow-up visits as told by your health care provider. This is important. Contact a health care provider if:  Your abdominal pain changes or gets worse.  You are not hungry or you lose weight without trying.  You are constipated or have diarrhea for more than 2-3 days.  You have pain when you urinate or have a bowel movement.  Your abdominal pain wakes you up at night.  Your pain gets worse with meals, after eating, or with certain foods.  You are vomiting and cannot keep anything down.  You have a fever.  You have blood in your urine. Get help right away if:  Your pain does not go away as soon as your health care provider told you to expect.  You cannot stop vomiting.  Your pain is only in areas of the abdomen, such as the right side or the left lower portion of the abdomen. Pain on the right side could be caused by appendicitis.  You have bloody or black stools, or stools that look like tar.  You have severe pain, cramping, or bloating in your abdomen.  You have signs of dehydration, such as: ? Dark urine, very little urine, or no urine. ? Cracked lips. ? Dry mouth. ? Sunken eyes. ? Sleepiness. ? Weakness.  You have trouble breathing or chest  pain. Summary  Often, abdominal pain is not serious and it gets better with no treatment or by being treated at home. However, sometimes abdominal pain is serious.  Watch your condition for any changes.  Take over-the-counter and prescription medicines only as told by your health care provider.  Contact a health care provider if your abdominal pain changes or gets worse.  Get help right away if you have severe pain, cramping, or bloating in your abdomen. This information is not intended to replace advice given to you by your health care provider. Make sure you discuss any questions you have with your health care provider. Document Revised: 12/08/2018 Document Reviewed: 12/08/2018 Elsevier Patient Education  2020 Elsevier Inc.  

## 2020-03-18 ENCOUNTER — Other Ambulatory Visit: Payer: Self-pay

## 2020-03-18 ENCOUNTER — Encounter: Payer: Self-pay | Admitting: Physician Assistant

## 2020-03-18 ENCOUNTER — Ambulatory Visit
Admission: RE | Admit: 2020-03-18 | Discharge: 2020-03-18 | Disposition: A | Discharge status: Home / Self Care | Payer: BC Managed Care – PPO | Source: Ambulatory Visit | Attending: Physician Assistant | Admitting: Physician Assistant

## 2020-03-18 DIAGNOSIS — R1011 Right upper quadrant pain: Secondary | ICD-10-CM | POA: Diagnosis not present

## 2020-03-18 DIAGNOSIS — R109 Unspecified abdominal pain: Secondary | ICD-10-CM | POA: Diagnosis not present

## 2020-03-19 LAB — QUANTIFERON-TB GOLD PLUS
QuantiFERON Mitogen Value: >10 IU/mL
QuantiFERON Nil Value: 0.04 IU/mL
QuantiFERON TB1 Ag Value: 0.04 IU/mL
QuantiFERON TB2 Ag Value: 0.04 IU/mL
QuantiFERON-TB Gold Plus: NEGATIVE

## 2020-03-19 LAB — CBC WITH DIFFERENTIAL/PLATELET
Basophils Absolute: 0 10*3/uL (ref 0.0–0.2)
Basos: 1 %
EOS (ABSOLUTE): 0.1 10*3/uL (ref 0.0–0.4)
Eos: 3 %
Hematocrit: 40.8 % (ref 34.0–46.6)
Hemoglobin: 13.3 g/dL (ref 11.1–15.9)
Immature Grans (Abs): 0 10*3/uL (ref 0.0–0.1)
Immature Granulocytes: 0 %
Lymphocytes Absolute: 1.3 10*3/uL (ref 0.7–3.1)
Lymphs: 34 %
MCH: 28.8 pg (ref 26.6–33.0)
MCHC: 32.6 g/dL (ref 31.5–35.7)
MCV: 88 fL (ref 79–97)
Monocytes Absolute: 0.4 10*3/uL (ref 0.1–0.9)
Monocytes: 9 %
Neutrophils Absolute: 2.1 10*3/uL (ref 1.4–7.0)
Neutrophils: 53 %
Platelets: 172 10*3/uL (ref 150–450)
RBC: 4.62 x10E6/uL (ref 3.77–5.28)
RDW: 13.4 % (ref 11.7–15.4)
WBC: 3.9 10*3/uL (ref 3.4–10.8)

## 2020-03-19 LAB — COMPREHENSIVE METABOLIC PANEL
ALT: 15 IU/L (ref 0–32)
AST: 16 IU/L (ref 0–40)
Albumin/Globulin Ratio: 1.6 (ref 1.2–2.2)
Albumin: 4.6 g/dL (ref 3.9–5.0)
Alkaline Phosphatase: 77 IU/L (ref 48–121)
BUN/Creatinine Ratio: 15 (ref 9–23)
BUN: 15 mg/dL (ref 6–20)
Bilirubin Total: 0.9 mg/dL (ref 0.0–1.2)
CO2: 23 mmol/L (ref 20–29)
Calcium: 9.2 mg/dL (ref 8.7–10.2)
Chloride: 102 mmol/L (ref 96–106)
Creatinine, Ser: 0.97 mg/dL (ref 0.57–1.00)
GFR calc Af Amer: 91 mL/min/{1.73_m2} (ref >59)
GFR calc non Af Amer: 79 mL/min/{1.73_m2} (ref >59)
Globulin, Total: 2.8 g/dL (ref 1.5–4.5)
Glucose: 87 mg/dL (ref 65–99)
Potassium: 4.4 mmol/L (ref 3.5–5.2)
Sodium: 140 mmol/L (ref 134–144)
Total Protein: 7.4 g/dL (ref 6.0–8.5)

## 2020-03-19 LAB — LIPASE: Lipase: 31 U/L (ref 14–72)

## 2020-03-19 LAB — AMYLASE: Amylase: 35 U/L (ref 31–110)

## 2020-03-23 DIAGNOSIS — Z20822 Contact with and (suspected) exposure to covid-19: Secondary | ICD-10-CM | POA: Diagnosis not present

## 2020-04-13 ENCOUNTER — Other Ambulatory Visit: Payer: Self-pay | Admitting: Physician Assistant

## 2020-04-13 DIAGNOSIS — F419 Anxiety disorder, unspecified: Secondary | ICD-10-CM

## 2020-04-13 DIAGNOSIS — F32A Depression, unspecified: Secondary | ICD-10-CM

## 2020-05-13 ENCOUNTER — Telehealth: Payer: Self-pay

## 2020-05-13 NOTE — Telephone Encounter (Signed)
Called from recall list to schedule an over due 6 month appt.  Patient does not wish to schedule and states she is doing fine.  would like to be taking off recall list.

## 2020-05-19 ENCOUNTER — Encounter: Payer: Self-pay | Admitting: Physician Assistant

## 2020-05-19 ENCOUNTER — Ambulatory Visit
Admission: EM | Admit: 2020-05-19 | Discharge: 2020-05-19 | Disposition: A | Discharge status: Home / Self Care | Payer: BC Managed Care – PPO | Attending: Family Medicine | Admitting: Family Medicine

## 2020-05-19 ENCOUNTER — Other Ambulatory Visit: Payer: Self-pay

## 2020-05-19 DIAGNOSIS — R3915 Urgency of urination: Secondary | ICD-10-CM | POA: Insufficient documentation

## 2020-05-19 DIAGNOSIS — R300 Dysuria: Secondary | ICD-10-CM | POA: Diagnosis not present

## 2020-05-19 DIAGNOSIS — N3090 Cystitis, unspecified without hematuria: Secondary | ICD-10-CM

## 2020-05-19 DIAGNOSIS — Z79899 Other long term (current) drug therapy: Secondary | ICD-10-CM | POA: Insufficient documentation

## 2020-05-19 DIAGNOSIS — R1030 Lower abdominal pain, unspecified: Secondary | ICD-10-CM | POA: Diagnosis not present

## 2020-05-19 DIAGNOSIS — R350 Frequency of micturition: Secondary | ICD-10-CM | POA: Diagnosis not present

## 2020-05-19 LAB — POCT URINALYSIS DIP (MANUAL ENTRY)
Bilirubin, UA: NEGATIVE
Blood, UA: NEGATIVE
Glucose, UA: NEGATIVE mg/dL
Ketones, POC UA: NEGATIVE mg/dL
Leukocytes, UA: NEGATIVE
Nitrite, UA: NEGATIVE
Protein Ur, POC: NEGATIVE mg/dL
Spec Grav, UA: 1.015 (ref 1.010–1.025)
Urobilinogen, UA: 0.2 E.U./dL
pH, UA: 7 (ref 5.0–8.0)

## 2020-05-19 MED ORDER — PHENAZOPYRIDINE HCL 100 MG PO TABS: 100.0000 mg | ORAL_TABLET | Freq: Three times a day (TID) | ORAL | 0 refills | Status: DC | PRN

## 2020-05-19 NOTE — ED Provider Notes (Signed)
[facility]   CC: UTI  SUBJECTIVE:  Melanie Valdez is a 29 y.o. female who complains of urinary frequency, urgency and dysuria for the past 3 days. Patient denies a precipitating event, recent sexual encounter, excessive caffeine intake. Localizes the pain to the lower abdomen. Has history of genital herpes, reports she is not having an outbreak currently.  Pain is intermittent and describes it as burning. Has not attempted OTC treatment. Symptoms are made worse with urination. Admits to similar symptoms in the past.  Denies fever, chills, nausea, vomiting, flank pain, abnormal vaginal discharge or bleeding, hematuria.    LMP: No LMP recorded. Patient has had an implant.  ROS: As in HPI.  All other pertinent ROS negative.     Past Medical History:  Diagnosis Date  . Asthma   . Complication of anesthesia   . PVC's (premature ventricular contractions)    Past Surgical History:  Procedure Laterality Date  . BREAST REDUCTION SURGERY    . BREAST SURGERY    . CESAREAN SECTION  2018  . COLONOSCOPY WITH PROPOFOL N/A 06/16/2019   Procedure: COLONOSCOPY WITH PROPOFOL;  Surgeon: Pasty Spillers, MD;  Location: [facility] ENDOSCOPY;  Service: Endoscopy;  Laterality: N/A;  . LEEP  2015  . TONSILLECTOMY    . TONSILLECTOMY AND ADENOIDECTOMY  1999  . WISDOM TOOTH EXTRACTION  2009   Allergies  Allergen Reactions  . Codeine   . Sulfa Antibiotics   . Hydrocodone-Acetaminophen Itching    Other reaction(s): mild rash/itching   No current facility-administered medications on file prior to encounter.   Current Outpatient Medications on File Prior to Encounter  Medication Sig Dispense Refill  . albuterol (VENTOLIN HFA) 108 (90 Base) MCG/ACT inhaler Inhale 1-2 puffs into the lungs every 6 (six) hours as needed.     . Cetirizine HCl (ZYRTEC ALLERGY) 10 MG CAPS Take 5 mg by mouth daily.     Marland Kitchen etonogestrel (NEXPLANON) 68 MG IMPL implant 1 each by Subdermal route once.    . fexofenadine  (ALLEGRA) 180 MG tablet Take 180 mg by mouth daily.    . fluticasone (FLONASE) 50 MCG/ACT nasal spray Place 2 sprays into both nostrils daily.    Marland Kitchen levalbuterol (XOPENEX) 0.31 MG/3ML nebulizer solution Inhale into the lungs.    . loperamide (IMODIUM A-D) 2 MG tablet Take 1 tablet (2 mg total) by mouth 4 (four) times daily as needed for up to 30 doses for diarrhea or loose stools. 30 tablet 0  . ondansetron (ZOFRAN) 4 MG tablet Take 1 tablet (4 mg total) by mouth every 8 (eight) hours as needed for nausea or vomiting. 20 tablet 0  . amitriptyline (ELAVIL) 10 MG tablet TAKE 1 TABLET BY MOUTH EVERYDAY AT BEDTIME 90 tablet 1  . escitalopram (LEXAPRO) 10 MG tablet TAKE 1 TABLET BY MOUTH EVERY DAY 90 tablet 0  . metoprolol succinate (TOPROL-XL) 25 MG 24 hr tablet Take 0.5 tablets (12.5 mg total) by mouth every other day. Take with or immediately following a meal.     Social History   Socioeconomic History  . Marital status: Married    Spouse name: Not on file  . Number of children: Not on file  . Years of education: Not on file  . Highest education level: Not on file  Occupational History  . Not on file  Tobacco Use  . Smoking status: Never Smoker  . Smokeless tobacco: Never Used  Vaping Use  . Vaping Use: Never used  Substance and Sexual  Activity  . Alcohol use: Yes    Alcohol/week: 1.0 standard drink    Types: 1 Glasses of wine per week  . Drug use: Never  . Sexual activity: Not on file  Other Topics Concern  . Not on file  Social History Narrative  . Not on file   Social Determinants of Health   Financial Resource Strain:   . Difficulty of Paying Living Expenses: Not on file  Food Insecurity:   . Worried About Programme researcher, broadcasting/film/video in the Last Year: Not on file  . Ran Out of Food in the Last Year: Not on file  Transportation Needs:   . Lack of Transportation (Medical): Not on file  . Lack of Transportation (Non-Medical): Not on file  Physical Activity:   . Days of Exercise  per Week: Not on file  . Minutes of Exercise per Session: Not on file  Stress:   . Feeling of Stress : Not on file  Social Connections:   . Frequency of Communication with Friends and Family: Not on file  . Frequency of Social Gatherings with Friends and Family: Not on file  . Attends Religious Services: Not on file  . Active Member of Clubs or Organizations: Not on file  . Attends Banker Meetings: Not on file  . Marital Status: Not on file  Intimate Partner Violence:   . Fear of Current or Ex-Partner: Not on file  . Emotionally Abused: Not on file  . Physically Abused: Not on file  . Sexually Abused: Not on file   Family History  Problem Relation Age of Onset  . Hypertension Maternal Grandmother   . Kidney disease Maternal Grandmother   . Heart disease Maternal Grandfather     OBJECTIVE:  Vitals:   05/19/20 1024 05/19/20 1027  BP:  114/79  Pulse:  79  Resp:  18  Temp:  99 F (37.2 C)  TempSrc:  Oral  SpO2:  98%  Weight: 168 lb (76.2 kg)   Height: 5\' 2"  (1.575 m)    General appearance: AOx3 in no acute distress HEENT: NCAT. Oropharynx clear.  Lungs: clear to auscultation bilaterally without adventitious breath sounds Heart: regular rate and rhythm. Radial pulses 2+ symmetrical bilaterally Abdomen: soft; non-distended; suprapubic tenderness; bowel sounds present; no guarding or rebound tenderness Back: no CVA tenderness Extremities: no edema; symmetrical with no gross deformities Skin: warm and dry Neurologic: Ambulates from chair to exam table without difficulty Psychological: alert and cooperative; normal mood and affect  Labs Reviewed  POCT URINALYSIS DIP (MANUAL ENTRY)  CERVICOVAGINAL ANCILLARY ONLY    ASSESSMENT & PLAN:  1. Dysuria   2. Urinary urgency   3. Urinary frequency    Swab testing sent We will call you with abnormal results that need further treatment UA was negative for infection today Prescribed Pyridium for urinary  discomfort Suspect vaginitis Push fluids and get plenty of rest  Take pyridium as prescribed and as needed for symptomatic relief Follow up with PCP if symptoms persists Return here or go to ER if you have any new or worsening symptoms such as fever, worsening abdominal pain, nausea/vomiting, flank pain  Outlined signs and symptoms indicating need for more acute intervention Patient verbalized understanding After Visit Summary given     , NP 05/19/20 1037

## 2020-05-19 NOTE — Discharge Instructions (Addendum)
You may have a urinary tract infection.   We are going to culture your urine and will call you as soon as we have the results.   Drink plenty of water, 8-10 glasses per day.   I have sent in Pyridium for you to take 3 times a day over the next 2 days as needed for urinary discomfort  Your swab testing will be back in about 2 days, we will follow up with you with test results that require further treatment.  Follow up with your primary care provider as needed.   Go to the Emergency Department if you experience severe pain, shortness of breath, high fever, or other concerns.

## 2020-05-19 NOTE — ED Triage Notes (Signed)
Patient states that she has been having urinary frequency, urgency and burning with urination x 2-3 days but symptoms worsened through last night.

## 2020-05-20 LAB — CERVICOVAGINAL ANCILLARY ONLY
Bacterial Vaginitis (gardnerella): NEGATIVE
Candida Glabrata: NEGATIVE
Candida Vaginitis: NEGATIVE
Chlamydia: NEGATIVE
Comment: NEGATIVE
Comment: NEGATIVE
Comment: NEGATIVE
Comment: NEGATIVE
Comment: NEGATIVE
Comment: NORMAL
Neisseria Gonorrhea: NEGATIVE
Trichomonas: NEGATIVE

## 2020-07-22 ENCOUNTER — Encounter: Payer: Self-pay | Admitting: Physician Assistant

## 2020-08-31 ENCOUNTER — Encounter: Payer: Self-pay | Admitting: Physician Assistant

## 2020-08-31 NOTE — Telephone Encounter (Signed)
OV

## 2020-12-27 ENCOUNTER — Encounter: Payer: Self-pay | Admitting: Family Medicine

## 2020-12-27 ENCOUNTER — Other Ambulatory Visit: Payer: Self-pay

## 2020-12-27 ENCOUNTER — Ambulatory Visit (INDEPENDENT_AMBULATORY_CARE_PROVIDER_SITE_OTHER): Payer: BC Managed Care – PPO | Admitting: Family Medicine

## 2020-12-27 VITALS — BP 131/90 | HR 90 | Temp 98.5°F | Resp 16 | Ht 63.0 in | Wt 161.0 lb

## 2020-12-27 DIAGNOSIS — R1011 Right upper quadrant pain: Secondary | ICD-10-CM | POA: Insufficient documentation

## 2020-12-27 DIAGNOSIS — K580 Irritable bowel syndrome with diarrhea: Secondary | ICD-10-CM | POA: Diagnosis not present

## 2020-12-27 DIAGNOSIS — Z1159 Encounter for screening for other viral diseases: Secondary | ICD-10-CM

## 2020-12-27 DIAGNOSIS — Z0000 Encounter for general adult medical examination without abnormal findings: Secondary | ICD-10-CM | POA: Diagnosis not present

## 2020-12-27 DIAGNOSIS — E663 Overweight: Secondary | ICD-10-CM

## 2020-12-27 MED ORDER — RIFAXIMIN 550 MG PO TABS: 550.0000 mg | ORAL_TABLET | Freq: Three times a day (TID) | ORAL | 0 refills | Status: AC

## 2020-12-27 NOTE — Patient Instructions (Signed)
Preventive Care 21-32 Years Old, Female Preventive care refers to lifestyle choices and visits with your health care provider that can promote health and wellness. This includes:  A yearly physical exam. This is also called an annual wellness visit.  Regular dental and eye exams.  Immunizations.  Screening for certain conditions.  Healthy lifestyle choices, such as: ? Eating a healthy diet. ? Getting regular exercise. ? Not using drugs or products that contain nicotine and tobacco. ? Limiting alcohol use. What can I expect for my preventive care visit? Physical exam Your health care provider may check your:  Height and weight. These may be used to calculate your BMI (body mass index). BMI is a measurement that tells if you are at a healthy weight.  Heart rate and blood pressure.  Body temperature.  Skin for abnormal spots. Counseling Your health care provider may ask you questions about your:  Past medical problems.  Family's medical history.  Alcohol, tobacco, and drug use.  Emotional well-being.  Home life and relationship well-being.  Sexual activity.  Diet, exercise, and sleep habits.  Work and work environment.  Access to firearms.  Method of birth control.  Menstrual cycle.  Pregnancy history. What immunizations do I need? Vaccines are usually given at various ages, according to a schedule. Your health care provider will recommend vaccines for you based on your age, medical history, and lifestyle or other factors, such as travel or where you work.   What tests do I need? Blood tests  Lipid and cholesterol levels. These may be checked every 5 years starting at age 20.  Hepatitis C test.  Hepatitis B test. Screening  Diabetes screening. This is done by checking your blood sugar (glucose) after you have not eaten for a while (fasting).  STD (sexually transmitted disease) testing, if you are at risk.  BRCA-related cancer screening. This may be  done if you have a family history of breast, ovarian, tubal, or peritoneal cancers.  Pelvic exam and Pap test. This may be done every 3 years starting at age 21. Starting at age 30, this may be done every 5 years if you have a Pap test in combination with an HPV test. Talk with your health care provider about your test results, treatment options, and if necessary, the need for more tests.   Follow these instructions at home: Eating and drinking  Eat a healthy diet that includes fresh fruits and vegetables, whole grains, lean protein, and low-fat dairy products.  Take vitamin and mineral supplements as recommended by your health care provider.  Do not drink alcohol if: ? Your health care provider tells you not to drink. ? You are pregnant, may be pregnant, or are planning to become pregnant.  If you drink alcohol: ? Limit how much you have to 0-1 drink a day. ? Be aware of how much alcohol is in your drink. In the [place] one drink equals one 12 oz bottle of beer (355 mL), one 5 oz glass of wine (148 mL), or one 1 oz glass of hard liquor (44 mL).   Lifestyle  Take daily care of your teeth and gums. Brush your teeth every morning and night with fluoride toothpaste. Floss one time each day.  Stay active. Exercise for at least 30 minutes 5 or more days each week.  Do not use any products that contain nicotine or tobacco, such as cigarettes, e-cigarettes, and chewing tobacco. If you need help quitting, ask your health care provider.  Do not   use drugs.  If you are sexually active, practice safe sex. Use a condom or other form of protection to prevent STIs (sexually transmitted infections).  If you do not wish to become pregnant, use a form of birth control. If you plan to become pregnant, see your health care provider for a prepregnancy visit.  Find healthy ways to cope with stress, such as: ? Meditation, yoga, or listening to music. ? Journaling. ? Talking to a trusted  person. ? Spending time with friends and family. Safety  Always wear your seat belt while driving or riding in a vehicle.  Do not drive: ? If you have been drinking alcohol. Do not ride with someone who has been drinking. ? When you are tired or distracted. ? While texting.  Wear a helmet and other protective equipment during sports activities.  If you have firearms in your house, make sure you follow all gun safety procedures.  Seek help if you have been physically or sexually abused. What's next?  Go to your health care provider once a year for an annual wellness visit.  Ask your health care provider how often you should have your eyes and teeth checked.  Stay up to date on all vaccines. This information is not intended to replace advice given to you by your health care provider. Make sure you discuss any questions you have with your health care provider. Document Revised: 03/27/2020 Document Reviewed: 04/10/2018 Elsevier Patient Education  2021 Elsevier Inc.  

## 2020-12-27 NOTE — Assessment & Plan Note (Signed)
Ongoing colicky right upper quadrant pain She cannot say whether this is postprandial or not Normal right upper quadrant ultrasound that showed no common bile duct dilation or gallstones or gallbladder wall thickening Given that she does have symptoms that seem consistent with biliary colic, we will proceed with HIDA scan to ensure normal gallbladder ejection fraction Pending results, consider referral to surgery

## 2020-12-27 NOTE — Progress Notes (Signed)
Complete physical exam   Patient: Arieonna Medine   DOB: 11/21/1990   30 y.o. Female  MRN: [id number] Visit Date: 12/27/2020  Today's healthcare provider: Lavon Paganini, MD   Chief Complaint  Patient presents with  . Annual Exam   Subjective     HPI  Azura Tufaro is a 30 y.o. female who presents today for a complete physical exam.  She reports consuming a general diet. Home exercise routine includes walking/cardio. She generally feels well. She reports sleeping fairly well. She does not have additional problems to discuss today.    Diarrhea Domanique is having right upper quadrant pain that feels like muscle pain. The pain is intermittent and can happen at rest. She doesn't have normal bowel movements, it is constant diarrhea that doesn't seem to be triggered by anything specific. Her symptoms began after her second child. She has tried amitriptyline and there was not improvement. She was taking Imodium, but it constipates her for a couple hours and then the diarrhea returns. She is concerned that her symptoms are related to an issue with her gallbladder.  Asthma She is taking Singulair for her asthma.  Pap smear She had an abnormal pap smear back in 2015 and received the LEEP procedure. Her proceeding pap smears were normal. She has had 5 miscarriages and became pregnant with her first child in 2018 and her second child in 2020.     Last Pap 02/25/2018- Negative Past Medical History:  Diagnosis Date  . Asthma   . Complication of anesthesia   . PVC's (premature ventricular contractions)    Past Surgical History:  Procedure Laterality Date  . BREAST REDUCTION SURGERY    . BREAST SURGERY    . CESAREAN SECTION  2018  . COLONOSCOPY WITH PROPOFOL N/A 06/16/2019   Procedure: COLONOSCOPY WITH PROPOFOL;  Surgeon: Virgel Manifold, MD;  Location: [facility] ENDOSCOPY;  Service: Endoscopy;  Laterality: N/A;  . LEEP  2015  . TONSILLECTOMY    . TONSILLECTOMY AND ADENOIDECTOMY   1999  . WISDOM TOOTH EXTRACTION  2009   Social History   Socioeconomic History  . Marital status: Married    Spouse name: Not on file  . Number of children: Not on file  . Years of education: Not on file  . Highest education level: Not on file  Occupational History  . Not on file  Tobacco Use  . Smoking status: Never Smoker  . Smokeless tobacco: Never Used  Vaping Use  . Vaping Use: Never used  Substance and Sexual Activity  . Alcohol use: Yes    Alcohol/week: 1.0 standard drink    Types: 1 Glasses of wine per week  . Drug use: Never  . Sexual activity: Not on file  Other Topics Concern  . Not on file  Social History Narrative  . Not on file   Social Determinants of Health   Financial Resource Strain: Not on file  Food Insecurity: Not on file  Transportation Needs: Not on file  Physical Activity: Not on file  Stress: Not on file  Social Connections: Not on file  Intimate Partner Violence: Not on file   Family Status  Relation Name Status  . Mother  Alive  . Father  Alive  . MGM  Alive  . MGF  Deceased   Family History  Problem Relation Age of Onset  . Hypertension Maternal Grandmother   . Kidney disease Maternal Grandmother   . Heart disease Maternal Grandfather    Allergies  Allergen Reactions  . Codeine   . Sulfa Antibiotics   . Hydrocodone-Acetaminophen Itching    Other reaction(s): mild rash/itching    Patient Care Team: Virginia Crews, MD as PCP - General (Family Medicine) Kate Sable, MD as PCP - Cardiology (Cardiology)   Medications: Outpatient Medications Prior to Visit  Medication Sig  . albuterol (VENTOLIN HFA) 108 (90 Base) MCG/ACT inhaler Inhale 1-2 puffs into the lungs every 6 (six) hours as needed.   Marland Kitchen amphetamine-dextroamphetamine (ADDERALL) 10 MG tablet Take 10 mg by mouth daily with breakfast.  . Cetirizine HCl (ZYRTEC ALLERGY) 10 MG CAPS Take 5 mg by mouth daily.   Marland Kitchen etonogestrel (NEXPLANON) 68 MG IMPL implant 1 each  by Subdermal route once.  . fexofenadine (ALLEGRA) 180 MG tablet Take 180 mg by mouth daily.  . fluticasone (FLONASE) 50 MCG/ACT nasal spray Place 2 sprays into both nostrils daily.  Marland Kitchen levalbuterol (XOPENEX) 0.31 MG/3ML nebulizer solution Inhale into the lungs.  . loperamide (IMODIUM A-D) 2 MG tablet Take 1 tablet (2 mg total) by mouth 4 (four) times daily as needed for up to 30 doses for diarrhea or loose stools.  . montelukast (SINGULAIR) 10 MG tablet Take 10 mg by mouth at bedtime.  . ondansetron (ZOFRAN) 4 MG tablet Take 1 tablet (4 mg total) by mouth every 8 (eight) hours as needed for nausea or vomiting. (Patient not taking: Reported on 12/27/2020)  . [DISCONTINUED] amitriptyline (ELAVIL) 10 MG tablet TAKE 1 TABLET BY MOUTH EVERYDAY AT BEDTIME  . [DISCONTINUED] escitalopram (LEXAPRO) 10 MG tablet TAKE 1 TABLET BY MOUTH EVERY DAY  . [DISCONTINUED] metoprolol succinate (TOPROL-XL) 25 MG 24 hr tablet Take 0.5 tablets (12.5 mg total) by mouth every other day. Take with or immediately following a meal.  . [DISCONTINUED] phenazopyridine (PYRIDIUM) 100 MG tablet Take 1 tablet (100 mg total) by mouth 3 (three) times daily as needed for pain.   No facility-administered medications prior to visit.    Review of Systems  Constitutional: Negative for chills, fatigue and fever.  HENT: Negative for ear pain, nosebleeds, rhinorrhea, sinus pressure, sinus pain and sore throat.   Eyes: Negative for pain.  Respiratory: Negative for cough, chest tightness, shortness of breath and wheezing.   Cardiovascular: Negative for chest pain, palpitations and leg swelling.  Gastrointestinal: Positive for abdominal pain (RUQ) and diarrhea. Negative for blood in stool, constipation, nausea and vomiting.  Genitourinary: Negative for dysuria, flank pain, frequency, hematuria, pelvic pain and urgency.  Musculoskeletal: Negative for back pain, neck pain and neck stiffness.  Neurological: Negative for dizziness, seizures,  syncope, weakness, light-headedness, numbness and headaches.  All other systems reviewed and are negative.   {Labs  Heme  Chem  Endocrine  Serology  Results Review (optional):23779::" "}  Objective    BP 131/90   Pulse 90   Temp 98.5 F (36.9 C) (Oral)   Resp 16   Ht 5' 3"  (1.6 m)   Wt 161 lb (73 kg)   LMP  (Within Weeks)   SpO2 100%   BMI 28.52 kg/m    Physical Exam Vitals reviewed.  Constitutional:      General: She is not in acute distress.    Appearance: Normal appearance. She is well-developed. She is not diaphoretic.  HENT:     Head: Normocephalic and atraumatic.     Right Ear: Tympanic membrane, ear canal and external ear normal.     Left Ear: Tympanic membrane, ear canal and external ear normal.  Nose: Nose normal.     Mouth/Throat:     Mouth: Mucous membranes are moist.     Pharynx: Oropharynx is clear. No oropharyngeal exudate.  Eyes:     General: No scleral icterus.    Conjunctiva/sclera: Conjunctivae normal.     Pupils: Pupils are equal, round, and reactive to light.  Neck:     Thyroid: No thyromegaly.  Cardiovascular:     Rate and Rhythm: Normal rate and regular rhythm.     Pulses: Normal pulses.     Heart sounds: Normal heart sounds. No murmur heard.   Pulmonary:     Effort: Pulmonary effort is normal. No respiratory distress.     Breath sounds: Normal breath sounds. No wheezing or rales.  Abdominal:     General: There is no distension.     Palpations: Abdomen is soft.     Tenderness: There is abdominal tenderness in the right upper quadrant. Negative signs include Murphy's sign.  Musculoskeletal:        General: No deformity.     Cervical back: Neck supple.     Right lower leg: No edema.     Left lower leg: No edema.  Lymphadenopathy:     Cervical: No cervical adenopathy.  Skin:    General: Skin is warm and dry.     Findings: No rash.  Neurological:     Mental Status: She is alert and oriented to person, place, and time. Mental  status is at baseline.     Sensory: No sensory deficit.     Motor: No weakness.     Gait: Gait normal.  Psychiatric:        Mood and Affect: Mood normal.        Behavior: Behavior normal.        Thought Content: Thought content normal.      Last depression screening scores PHQ 2/9 Scores 12/27/2020 12/16/2019 02/03/2019  PHQ - 2 Score 0 0 2  PHQ- 9 Score 0 4 4   Last fall risk screening Fall Risk  12/27/2020  Falls in the past year? 0  Number falls in past yr: 0  Injury with Fall? 0   Last Audit-C alcohol use screening Alcohol Use Disorder Test (AUDIT) 12/27/2020  1. How often do you have a drink containing alcohol? 2  2. How many drinks containing alcohol do you have on a typical day when you are drinking? 0  3. How often do you have six or more drinks on one occasion? 0  AUDIT-C Score 2   A score of 3 or more in women, and 4 or more in men indicates increased risk for alcohol abuse, EXCEPT if all of the points are from question 1   No results found for any visits on 12/27/20.  Assessment & Plan     Problem List Items Addressed This Visit      Digestive   Irritable bowel syndrome with diarrhea    Long standing with normal colonoscopy by GI Failed immodium and amitriptyline trials in the past Tried and failed pelvic PT and probiotics/deitary changes Will try rifaxamin        Other   RUQ pain    Ongoing colicky right upper quadrant pain She cannot say whether this is postprandial or not Normal right upper quadrant ultrasound that showed no common bile duct dilation or gallstones or gallbladder wall thickening Given that she does have symptoms that seem consistent with biliary colic, we will proceed with HIDA scan to  ensure normal gallbladder ejection fraction Pending results, consider referral to surgery      Relevant Orders   NM Hepato W/Eject Fract   Overweight    Discussed importance of healthy weight management Discussed diet and exercise        Other Visit  Diagnoses    Encounter for annual physical exam    -  Primary   Relevant Orders   Hepatitis C Antibody   CBC w/Diff/Platelet   Comprehensive metabolic panel   Lipid panel   Need for hepatitis C screening test       Relevant Orders   Hepatitis C Antibody      Routine Health Maintenance and Physical Exam  Exercise Activities and Dietary recommendations Goals   None     Immunization History  Administered Date(s) Administered  . DTaP 09/10/1989, 11/12/1989, 01/14/1990, 10/21/1990, 07/19/1993  . Hepatitis B 08/10/1999, 09/11/1999, 01/19/2000  . HiB (PRP-OMP) 09/10/1989, 11/12/1989, 01/14/1990, 10/21/1990  . IPV 09/10/1989, 11/12/1989, 10/21/1990, 07/19/1993  . Influenza,inj,Quad PF,6+ Mos 05/01/2017  . Influenza-Unspecified 04/28/2018  . MMR 10/21/1990, 07/19/1994  . Meningococcal Conjugate 03/06/2004  . PFIZER(Purple Top)SARS-COV-2 Vaccination 11/05/2019, 11/26/2019  . Td 03/06/2004  . Tdap 05/01/2017, 09/15/2018    Health Maintenance  Topic Date Due  . Hepatitis C Screening  Never done  . COVID-19 Vaccine (3 - Booster for Pfizer series) 04/27/2020  . PAP SMEAR-Modifier  02/25/2021  . INFLUENZA VACCINE  03/13/2021  . TETANUS/TDAP  09/15/2028  . HIV Screening  Completed  . HPV VACCINES  Aged Out    Discussed health benefits of physical activity, and encouraged her to engage in regular exercise appropriate for her age and condition.    Return in about 1 year (around 12/27/2021) for CPE.      I,Essence Moffit,acting as a Education administrator for Lavon Paganini, MD.,have documented all relevant documentation on the behalf of Lavon Paganini, MD,as directed by  Lavon Paganini, MD while in the presence of Lavon Paganini, MD.   I, Lavon Paganini, MD, have reviewed all documentation for this visit. The documentation on 12/27/20 for the exam, diagnosis, procedures, and orders are all accurate and complete.   Shadd Dunstan, Dionne Bucy, MD, MPH [facility]

## 2020-12-27 NOTE — Assessment & Plan Note (Addendum)
Long standing with normal colonoscopy by GI Failed immodium and amitriptyline trials in the past Tried and failed pelvic PT and probiotics/deitary changes Will try rifaxamin

## 2020-12-27 NOTE — Assessment & Plan Note (Signed)
Discussed importance of healthy weight management Discussed diet and exercise  

## 2020-12-28 DIAGNOSIS — Z0000 Encounter for general adult medical examination without abnormal findings: Secondary | ICD-10-CM | POA: Diagnosis not present

## 2020-12-28 DIAGNOSIS — Z1159 Encounter for screening for other viral diseases: Secondary | ICD-10-CM | POA: Diagnosis not present

## 2020-12-28 MED ORDER — VIBERZI 100 MG PO TABS: 100.0000 mg | ORAL_TABLET | Freq: Two times a day (BID) | ORAL | 1 refills | Status: DC

## 2020-12-29 LAB — LIPID PANEL
Chol/HDL Ratio: 5.6 ratio — ABNORMAL HIGH (ref 0.0–4.4)
Cholesterol, Total: 211 mg/dL — ABNORMAL HIGH (ref 100–199)
HDL: 38 mg/dL — ABNORMAL LOW (ref >39)
LDL Chol Calc (NIH): 142 mg/dL — ABNORMAL HIGH (ref 0–99)
Triglycerides: 172 mg/dL — ABNORMAL HIGH (ref 0–149)
VLDL Cholesterol Cal: 31 mg/dL (ref 5–40)

## 2020-12-29 LAB — CBC WITH DIFFERENTIAL/PLATELET
Basophils Absolute: 0 10*3/uL (ref 0.0–0.2)
Basos: 1 %
EOS (ABSOLUTE): 0.2 10*3/uL (ref 0.0–0.4)
Eos: 3 %
Hematocrit: 41.3 % (ref 34.0–46.6)
Hemoglobin: 13.7 g/dL (ref 11.1–15.9)
Immature Grans (Abs): 0 10*3/uL (ref 0.0–0.1)
Immature Granulocytes: 0 %
Lymphocytes Absolute: 1.6 10*3/uL (ref 0.7–3.1)
Lymphs: 20 %
MCH: 28.9 pg (ref 26.6–33.0)
MCHC: 33.2 g/dL (ref 31.5–35.7)
MCV: 87 fL (ref 79–97)
Monocytes Absolute: 0.4 10*3/uL (ref 0.1–0.9)
Monocytes: 5 %
Neutrophils Absolute: 5.9 10*3/uL (ref 1.4–7.0)
Neutrophils: 71 %
Platelets: 211 10*3/uL (ref 150–450)
RBC: 4.74 x10E6/uL (ref 3.77–5.28)
RDW: 12.8 % (ref 11.7–15.4)
WBC: 8.2 10*3/uL (ref 3.4–10.8)

## 2020-12-29 LAB — COMPREHENSIVE METABOLIC PANEL
ALT: 15 IU/L (ref 0–32)
AST: 10 IU/L (ref 0–40)
Albumin/Globulin Ratio: 1.5 (ref 1.2–2.2)
Albumin: 4.6 g/dL (ref 3.8–4.8)
Alkaline Phosphatase: 77 IU/L (ref 44–121)
BUN/Creatinine Ratio: 12 (ref 9–23)
BUN: 10 mg/dL (ref 6–20)
Bilirubin Total: 1 mg/dL (ref 0.0–1.2)
CO2: 24 mmol/L (ref 20–29)
Calcium: 9.7 mg/dL (ref 8.7–10.2)
Chloride: 101 mmol/L (ref 96–106)
Creatinine, Ser: 0.85 mg/dL (ref 0.57–1.00)
Globulin, Total: 3.1 g/dL (ref 1.5–4.5)
Glucose: 85 mg/dL (ref 65–99)
Potassium: 4.4 mmol/L (ref 3.5–5.2)
Sodium: 140 mmol/L (ref 134–144)
Total Protein: 7.7 g/dL (ref 6.0–8.5)
eGFR: 94 mL/min/{1.73_m2} (ref >59)

## 2020-12-29 LAB — HEPATITIS C ANTIBODY: Hep C Virus Ab: <0.1 s/co ratio (ref 0.0–0.9)

## 2021-01-12 ENCOUNTER — Ambulatory Visit: Payer: BC Managed Care – PPO

## 2021-01-14 DIAGNOSIS — H65192 Other acute nonsuppurative otitis media, left ear: Secondary | ICD-10-CM | POA: Diagnosis not present

## 2021-01-31 ENCOUNTER — Telehealth: Payer: Self-pay | Admitting: Family Medicine

## 2021-01-31 DIAGNOSIS — R109 Unspecified abdominal pain: Secondary | ICD-10-CM

## 2021-01-31 MED ORDER — ONDANSETRON HCL 4 MG PO TABS: 4.0000 mg | ORAL_TABLET | Freq: Three times a day (TID) | ORAL | 0 refills | Status: DC | PRN

## 2021-01-31 NOTE — Telephone Encounter (Signed)
Patient requesting refill for Zofran to use as needed.

## 2021-01-31 NOTE — Telephone Encounter (Signed)
[facility] faxed refill request for the following medications:  ondansetron (ZOFRAN) 4 MG tablet  Last Rx: 03/16/20 Qty: 20 Refills: 0 LOV: 12/27/20 Please advise. Thanks TNP

## 2021-02-06 ENCOUNTER — Encounter: Payer: Self-pay | Admitting: Family Medicine

## 2021-02-06 MED ORDER — MONTELUKAST SODIUM 10 MG PO TABS: 10.0000 mg | ORAL_TABLET | Freq: Every day | ORAL | 1 refills | Status: DC

## 2021-02-06 NOTE — Telephone Encounter (Signed)
Ok to refill Singulair? Initally Rx'd by urgent care. Please advise. Thanks!

## 2021-02-09 ENCOUNTER — Ambulatory Visit
Admission: RE | Admit: 2021-02-09 | Discharge: 2021-02-09 | Disposition: A | Discharge status: Home / Self Care | Payer: BC Managed Care – PPO | Source: Ambulatory Visit | Attending: Family Medicine | Admitting: Family Medicine

## 2021-02-09 ENCOUNTER — Other Ambulatory Visit: Payer: Self-pay

## 2021-02-09 DIAGNOSIS — R1011 Right upper quadrant pain: Secondary | ICD-10-CM | POA: Diagnosis not present

## 2021-02-09 MED ORDER — TECHNETIUM TC 99M MEBROFENIN IV KIT
5.1200 | PACK | Freq: Once | INTRAVENOUS | Status: AC | PRN
  Administered 2021-02-09: 5.12 via INTRAVENOUS

## 2021-02-20 ENCOUNTER — Encounter: Payer: Self-pay | Admitting: Family Medicine

## 2021-02-20 DIAGNOSIS — R7989 Other specified abnormal findings of blood chemistry: Secondary | ICD-10-CM

## 2021-02-20 DIAGNOSIS — R002 Palpitations: Secondary | ICD-10-CM

## 2021-02-21 NOTE — Telephone Encounter (Signed)
Please order free T4 level. Thanks!

## 2021-02-27 DIAGNOSIS — R7989 Other specified abnormal findings of blood chemistry: Secondary | ICD-10-CM | POA: Diagnosis not present

## 2021-02-27 DIAGNOSIS — R002 Palpitations: Secondary | ICD-10-CM | POA: Diagnosis not present

## 2021-02-28 LAB — T4, FREE: Free T4: 1.06 ng/dL (ref 0.82–1.77)

## 2021-03-14 ENCOUNTER — Ambulatory Visit: Payer: BC Managed Care – PPO | Admitting: Family Medicine

## 2021-03-14 NOTE — Progress Notes (Deleted)
      Established patient visit   Patient: Melanie Valdez   DOB: Dec 21, 1990   30 y.o. Female  MRN: [id number] Visit Date: 03/14/2021  Today's healthcare provider: Dortha Kern, PA-C   No chief complaint on file.  Subjective    HPI  Patient is a 30 year old female who presents for follow up after being seen in the ED in [place] for gastritis on 02/18/21.  She was given Omeprazole daily, modify diet, use Mylanta for worsening symptoms and follow up with PCP.  {Show patient history (optional):23778}   Medications: Outpatient Medications Prior to Visit  Medication Sig   albuterol (VENTOLIN HFA) 108 (90 Base) MCG/ACT inhaler Inhale 1-2 puffs into the lungs every 6 (six) hours as needed.    amphetamine-dextroamphetamine (ADDERALL) 10 MG tablet Take 10 mg by mouth daily with breakfast.   Cetirizine HCl (ZYRTEC ALLERGY) 10 MG CAPS Take 5 mg by mouth daily.    Eluxadoline (VIBERZI) 100 MG TABS Take 1 tablet (100 mg total) by mouth 2 (two) times daily with a meal.   etonogestrel (NEXPLANON) 68 MG IMPL implant 1 each by Subdermal route once.   fexofenadine (ALLEGRA) 180 MG tablet Take 180 mg by mouth daily.   fluticasone (FLONASE) 50 MCG/ACT nasal spray Place 2 sprays into both nostrils daily.   levalbuterol (XOPENEX) 0.31 MG/3ML nebulizer solution Inhale into the lungs.   loperamide (IMODIUM A-D) 2 MG tablet Take 1 tablet (2 mg total) by mouth 4 (four) times daily as needed for up to 30 doses for diarrhea or loose stools.   montelukast (SINGULAIR) 10 MG tablet Take 1 tablet (10 mg total) by mouth at bedtime.   ondansetron (ZOFRAN) 4 MG tablet Take 1 tablet (4 mg total) by mouth every 8 (eight) hours as needed for nausea or vomiting.   No facility-administered medications prior to visit.    Review of Systems  {Labs  Heme  Chem  Endocrine  Serology  Results Review (optional):23779}   Objective    There were no vitals taken for this visit. {Show previous vital signs  (optional):23777}   Physical Exam  ***  No results found for any visits on 03/14/21.  Assessment & Plan     ***  No follow-ups on file.      {provider attestation***:1}   Dortha Kern, Cordelia Poche  [facility] [phone] (phone) [phone] (fax)  [facility]

## 2021-03-16 DIAGNOSIS — Z111 Encounter for screening for respiratory tuberculosis: Secondary | ICD-10-CM | POA: Diagnosis not present

## 2021-03-18 DIAGNOSIS — Z111 Encounter for screening for respiratory tuberculosis: Secondary | ICD-10-CM | POA: Diagnosis not present

## 2021-03-30 DIAGNOSIS — Z3046 Encounter for surveillance of implantable subdermal contraceptive: Secondary | ICD-10-CM | POA: Diagnosis not present

## 2021-03-30 DIAGNOSIS — Z01419 Encounter for gynecological examination (general) (routine) without abnormal findings: Secondary | ICD-10-CM | POA: Diagnosis not present

## 2021-03-30 DIAGNOSIS — Z6831 Body mass index (BMI) 31.0-31.9, adult: Secondary | ICD-10-CM | POA: Diagnosis not present

## 2021-03-30 DIAGNOSIS — Z309 Encounter for contraceptive management, unspecified: Secondary | ICD-10-CM | POA: Diagnosis not present

## 2021-03-30 LAB — HM PAP SMEAR: HM Pap smear: NEGATIVE

## 2021-06-05 ENCOUNTER — Other Ambulatory Visit: Payer: Self-pay | Admitting: Family Medicine

## 2021-06-05 DIAGNOSIS — R109 Unspecified abdominal pain: Secondary | ICD-10-CM

## 2021-06-06 DIAGNOSIS — B078 Other viral warts: Secondary | ICD-10-CM | POA: Diagnosis not present

## 2021-06-06 DIAGNOSIS — D225 Melanocytic nevi of trunk: Secondary | ICD-10-CM | POA: Diagnosis not present

## 2021-06-06 DIAGNOSIS — R238 Other skin changes: Secondary | ICD-10-CM | POA: Diagnosis not present

## 2021-06-06 DIAGNOSIS — D2272 Melanocytic nevi of left lower limb, including hip: Secondary | ICD-10-CM | POA: Diagnosis not present

## 2021-06-06 DIAGNOSIS — D2262 Melanocytic nevi of left upper limb, including shoulder: Secondary | ICD-10-CM | POA: Diagnosis not present

## 2021-06-06 DIAGNOSIS — D2261 Melanocytic nevi of right upper limb, including shoulder: Secondary | ICD-10-CM | POA: Diagnosis not present

## 2021-06-06 NOTE — Telephone Encounter (Signed)
Requested medications are due for refill today yes  Requested medications are on the active medication list yes  Last refill 01/31/21  Last visit 12/27/20, no show 03/14/21  Future visit scheduled no  Notes to clinic not delegated.  Requested Prescriptions  Pending Prescriptions Disp Refills   ondansetron (ZOFRAN) 4 MG tablet [Pharmacy Med Name: ONDANSETRON HCL 4 MG TABLET] 18 tablet 1    Sig: TAKE 1 TABLET BY MOUTH EVERY 8 HOURS AS NEEDED FOR NAUSEA AND VOMITING     Not Delegated - Gastroenterology: Antiemetics Failed - 06/05/2021  6:24 PM      Failed - This refill cannot be delegated      Passed - Valid encounter within last 6 months    Recent Outpatient Visits           5 months ago Encounter for annual physical exam   [facility] [place], Marzella Schlein, [place]   1 year ago Abdominal pain, unspecified abdominal location   [facility], [place], [place]   1 year ago Annual physical exam   [facility] [place], [place]   2 years ago Anxiety and depression   [facility] [place], [place], [place]   2 years ago Depression, unspecified depression type   [facility], [place], [place]

## 2021-06-23 DIAGNOSIS — M25512 Pain in left shoulder: Secondary | ICD-10-CM | POA: Diagnosis not present

## 2021-07-26 ENCOUNTER — Other Ambulatory Visit: Payer: Self-pay | Admitting: Family Medicine

## 2021-07-26 NOTE — Telephone Encounter (Signed)
Requested Prescriptions  Pending Prescriptions Disp Refills   montelukast (SINGULAIR) 10 MG tablet [Pharmacy Med Name: MONTELUKAST SOD 10 MG TABLET] 90 tablet 1    Sig: TAKE 1 TABLET BY MOUTH EVERYDAY AT BEDTIME     Pulmonology:  Leukotriene Inhibitors Passed - 07/26/2021  1:38 AM      Passed - Valid encounter within last 12 months    Recent Outpatient Visits          7 months ago Encounter for annual physical exam   [facility] [place], Marzella Schlein, [place]   1 year ago Abdominal pain, unspecified abdominal location   [facility] [place], [place], [place]   1 year ago Annual physical exam   [facility] [place]st [place], [place]   2 years ago Anxiety and depression   [facility] [place], [place], [place]   2 years ago Depression, unspecified depression type   [facility] [place], [place], [place]

## 2021-08-02 ENCOUNTER — Ambulatory Visit (INDEPENDENT_AMBULATORY_CARE_PROVIDER_SITE_OTHER): Payer: BC Managed Care – PPO | Admitting: Family Medicine

## 2021-08-02 ENCOUNTER — Encounter: Payer: Self-pay | Admitting: Family Medicine

## 2021-08-02 ENCOUNTER — Other Ambulatory Visit: Payer: Self-pay

## 2021-08-02 VITALS — BP 130/89 | HR 109 | Resp 15 | Wt 158.7 lb

## 2021-08-02 DIAGNOSIS — J0140 Acute pansinusitis, unspecified: Secondary | ICD-10-CM | POA: Insufficient documentation

## 2021-08-02 DIAGNOSIS — R599 Enlarged lymph nodes, unspecified: Secondary | ICD-10-CM | POA: Insufficient documentation

## 2021-08-02 DIAGNOSIS — J4520 Mild intermittent asthma, uncomplicated: Secondary | ICD-10-CM | POA: Diagnosis not present

## 2021-08-02 DIAGNOSIS — H6122 Impacted cerumen, left ear: Secondary | ICD-10-CM | POA: Insufficient documentation

## 2021-08-02 MED ORDER — ALBUTEROL SULFATE HFA 108 (90 BASE) MCG/ACT IN AERS: 1.0000 | INHALATION_SPRAY | Freq: Four times a day (QID) | RESPIRATORY_TRACT | 1 refills | Status: DC | PRN

## 2021-08-02 MED ORDER — PREDNISONE 20 MG PO TABS: 20.0000 mg | ORAL_TABLET | Freq: Every day | ORAL | 0 refills | Status: DC

## 2021-08-02 MED ORDER — AMOXICILLIN-POT CLAVULANATE 875-125 MG PO TABS: 1.0000 | ORAL_TABLET | Freq: Two times a day (BID) | ORAL | 0 refills | Status: DC

## 2021-08-02 NOTE — Assessment & Plan Note (Signed)
abx provided; ear pain likely related to sinus pressure and build up of wax However, abx will also cover AOM or effusion Encourage OTC medication for other s/s relief

## 2021-08-02 NOTE — Assessment & Plan Note (Signed)
Continues maintenance medications Refill of inhaler provider Low dose steroid taper to prevent exacerbation

## 2021-08-02 NOTE — Assessment & Plan Note (Signed)
Encouraged removal following resolution of sinus issues with OTC debrox

## 2021-08-02 NOTE — Assessment & Plan Note (Signed)
Prednisone, 1 week, single dose

## 2021-08-02 NOTE — Progress Notes (Signed)
Established patient visit   Patient: Melanie Valdez   DOB: [birth year]   30 y.o. Female  MRN: [id number] Visit Date: 08/02/2021  Today's healthcare provider: Jacky Kindle, FNP   Chief Complaint  Patient presents with   Ear Pain   Subjective    Otalgia  There is pain in both ears. This is a new problem. The current episode started in the past 7 days. The problem has been unchanged. There has been no fever. Associated symptoms include ear discharge and headaches. Pertinent negatives include no abdominal pain, coughing, diarrhea, hearing loss, neck pain, rash, rhinorrhea, sore throat or vomiting. She has tried acetaminophen for the symptoms. The treatment provided moderate relief. Her past medical history is significant for a chronic ear infection and a tympanostomy tube. There is no history of hearing loss.     Medications: Outpatient Medications Prior to Visit  Medication Sig   atomoxetine (STRATTERA) 40 MG capsule Take 40 mg by mouth 2 (two) times daily.   Cetirizine HCl (ZYRTEC ALLERGY) 10 MG CAPS Take 5 mg by mouth daily.    Eluxadoline (VIBERZI) 100 MG TABS Take 1 tablet (100 mg total) by mouth 2 (two) times daily with a meal.   etonogestrel (NEXPLANON) 68 MG IMPL implant 1 each by Subdermal route once.   fexofenadine (ALLEGRA) 180 MG tablet Take 180 mg by mouth daily.   fluticasone (FLONASE) 50 MCG/ACT nasal spray Place 2 sprays into both nostrils daily.   levalbuterol (XOPENEX) 0.31 MG/3ML nebulizer solution Inhale into the lungs.   montelukast (SINGULAIR) 10 MG tablet TAKE 1 TABLET BY MOUTH EVERYDAY AT BEDTIME   norethindrone (MICRONOR) 0.35 MG tablet Take 1 tablet by mouth daily.   ondansetron (ZOFRAN) 4 MG tablet TAKE 1 TABLET BY MOUTH EVERY 8 HOURS AS NEEDED FOR NAUSEA AND VOMITING   [DISCONTINUED] albuterol (VENTOLIN HFA) 108 (90 Base) MCG/ACT inhaler Inhale 1-2 puffs into the lungs every 6 (six) hours as needed.    [DISCONTINUED] amphetamine-dextroamphetamine  (ADDERALL) 10 MG tablet Take 10 mg by mouth daily with breakfast. (Patient not taking: Reported on 08/02/2021)   [DISCONTINUED] loperamide (IMODIUM A-D) 2 MG tablet Take 1 tablet (2 mg total) by mouth 4 (four) times daily as needed for up to 30 doses for diarrhea or loose stools.   No facility-administered medications prior to visit.    Review of Systems  HENT:  Positive for ear discharge and ear pain. Negative for hearing loss, rhinorrhea and sore throat.   Respiratory:  Negative for cough.   Gastrointestinal:  Negative for abdominal pain, diarrhea and vomiting.  Musculoskeletal:  Negative for neck pain.  Skin:  Negative for rash.  Neurological:  Positive for headaches.      Objective    BP 130/89    Pulse (!) 109    Resp 15    Wt 158 lb 11.2 oz (72 kg)    SpO2 100%    BMI 28.11 kg/m    Physical Exam Vitals and nursing note reviewed.  Constitutional:      General: She is not in acute distress.    Appearance: Normal appearance. She is overweight. She is not ill-appearing, toxic-appearing or diaphoretic.  HENT:     Head: Normocephalic and atraumatic.     Right Ear: Tympanic membrane, ear canal and external ear normal.     Left Ear: Tympanic membrane, ear canal and external ear normal.     Nose:     Right Sinus: Maxillary sinus tenderness and frontal  sinus tenderness present.     Left Sinus: Maxillary sinus tenderness and frontal sinus tenderness present.  Cardiovascular:     Rate and Rhythm: Normal rate and regular rhythm.     Pulses: Normal pulses.     Heart sounds: Normal heart sounds. No murmur heard.   No friction rub. No gallop.  Pulmonary:     Effort: Pulmonary effort is normal. No respiratory distress.     Breath sounds: Normal breath sounds. No stridor. No wheezing, rhonchi or rales.  Chest:     Chest wall: No tenderness.  Abdominal:     General: Bowel sounds are normal.     Palpations: Abdomen is soft.  Musculoskeletal:        General: No swelling, tenderness,  deformity or signs of injury. Normal range of motion.     Right lower leg: No edema.     Left lower leg: No edema.  Lymphadenopathy:     Cervical: Cervical adenopathy present.     Right cervical: Superficial cervical adenopathy, deep cervical adenopathy and posterior cervical adenopathy present.     Left cervical: Superficial cervical adenopathy, deep cervical adenopathy and posterior cervical adenopathy present.  Skin:    General: Skin is warm and dry.     Capillary Refill: Capillary refill takes less than 2 seconds.     Coloration: Skin is not jaundiced or pale.     Findings: No bruising, erythema, lesion or rash.  Neurological:     General: No focal deficit present.     Mental Status: She is alert and oriented to person, place, and time. Mental status is at baseline.     Cranial Nerves: No cranial nerve deficit.     Sensory: No sensory deficit.     Motor: No weakness.     Coordination: Coordination normal.  Psychiatric:        Mood and Affect: Mood normal.        Behavior: Behavior normal.        Thought Content: Thought content normal.        Judgment: Judgment normal.      No results found for any visits on 08/02/21.  Assessment & Plan     Problem List Items Addressed This Visit       Respiratory   Asthma    Continues maintenance medications Refill of inhaler provider Low dose steroid taper to prevent exacerbation       Relevant Medications   predniSONE (DELTASONE) 20 MG tablet   albuterol (VENTOLIN HFA) 108 (90 Base) MCG/ACT inhaler   Acute non-recurrent pansinusitis - Primary    abx provided; ear pain likely related to sinus pressure and build up of wax However, abx will also cover AOM or effusion Encourage OTC medication for other s/s relief      Relevant Medications   amoxicillin-clavulanate (AUGMENTIN) 875-125 MG tablet   predniSONE (DELTASONE) 20 MG tablet     Nervous and Auditory   Left ear impacted cerumen    Encouraged removal following resolution  of sinus issues with OTC debrox        Immune and Lymphatic   Glands swollen    Prednisone, 1 week, single dose      Relevant Medications   predniSONE (DELTASONE) 20 MG tablet     Return if symptoms worsen or fail to improve.      Melanie Merl, FNP, have reviewed all documentation for this visit. The documentation on 08/02/21 for the exam, diagnosis, procedures, and orders are  all accurate and complete.    Melanie Valdez, [place] [phone] (phone) [phone] (fax)  [place]

## 2021-08-13 HISTORY — PX: INCISION AND DRAINAGE PERIRECTAL ABSCESS: SHX1804

## 2021-08-24 ENCOUNTER — Ambulatory Visit: Admission: EM | Admit: 2021-08-24 | Discharge: 2021-08-24 | Payer: BC Managed Care – PPO

## 2021-08-24 ENCOUNTER — Encounter: Payer: Self-pay | Admitting: Emergency Medicine

## 2021-08-24 DIAGNOSIS — E663 Overweight: Secondary | ICD-10-CM | POA: Diagnosis not present

## 2021-08-24 DIAGNOSIS — Z6828 Body mass index (BMI) 28.0-28.9, adult: Secondary | ICD-10-CM | POA: Diagnosis not present

## 2021-08-24 DIAGNOSIS — R2242 Localized swelling, mass and lump, left lower limb: Secondary | ICD-10-CM | POA: Diagnosis not present

## 2021-08-24 DIAGNOSIS — N764 Abscess of vulva: Secondary | ICD-10-CM | POA: Diagnosis not present

## 2021-08-24 DIAGNOSIS — R161 Splenomegaly, not elsewhere classified: Secondary | ICD-10-CM | POA: Diagnosis not present

## 2021-08-24 DIAGNOSIS — R1900 Intra-abdominal and pelvic swelling, mass and lump, unspecified site: Secondary | ICD-10-CM | POA: Diagnosis not present

## 2021-08-24 DIAGNOSIS — L02215 Cutaneous abscess of perineum: Secondary | ICD-10-CM | POA: Diagnosis not present

## 2021-08-24 DIAGNOSIS — M79652 Pain in left thigh: Secondary | ICD-10-CM | POA: Diagnosis not present

## 2021-08-24 DIAGNOSIS — N9089 Other specified noninflammatory disorders of vulva and perineum: Secondary | ICD-10-CM | POA: Diagnosis not present

## 2021-08-24 DIAGNOSIS — J45909 Unspecified asthma, uncomplicated: Secondary | ICD-10-CM | POA: Diagnosis not present

## 2021-08-24 DIAGNOSIS — Z79899 Other long term (current) drug therapy: Secondary | ICD-10-CM | POA: Diagnosis not present

## 2021-08-24 DIAGNOSIS — R000 Tachycardia, unspecified: Secondary | ICD-10-CM | POA: Diagnosis not present

## 2021-08-24 DIAGNOSIS — R102 Pelvic and perineal pain: Secondary | ICD-10-CM | POA: Diagnosis not present

## 2021-08-24 DIAGNOSIS — L0231 Cutaneous abscess of buttock: Secondary | ICD-10-CM | POA: Diagnosis not present

## 2021-08-24 DIAGNOSIS — Z882 Allergy status to sulfonamides status: Secondary | ICD-10-CM | POA: Diagnosis not present

## 2021-08-24 DIAGNOSIS — K610 Anal abscess: Secondary | ICD-10-CM | POA: Diagnosis not present

## 2021-08-24 NOTE — ED Triage Notes (Signed)
Pt presents with a cyst at the base of her vagina x 1 week.

## 2021-08-24 NOTE — Discharge Instructions (Signed)
You have a large mass deep behind the lower labia and buttocks region which does not look typical of Bartholin gland cyst. Please go to ER to have more test done, which we cant do here.

## 2021-08-24 NOTE — ED Provider Notes (Addendum)
[name]    CSN: [id number] Arrival date & time: 08/24/21  1856      History   Chief Complaint Chief Complaint  Patient presents with   Cyst    HPI Melanie Valdez is a 31 y.o. female who presents with L labia/buttocks area x 7 days. Started gradually while driving and thought it was a hemorrhoid, but last night the pain got sever and she looked at this area of pain, and knows is not a hemorrhoid. Has a lot of pressure in this area and radiates to her pubic bone. Denies difficulty with voiding or having BM's, and her stools have been soft and easy to pass.     Past Medical History:  Diagnosis Date   Asthma    Complication of anesthesia    PVC's (premature ventricular contractions)     Patient Active Problem List   Diagnosis Date Noted   Acute non-recurrent pansinusitis 08/02/2021   Glands swollen 08/02/2021   Left ear impacted cerumen 08/02/2021   RUQ pain 12/27/2020   Irritable bowel syndrome with diarrhea 12/27/2020   Overweight 12/27/2020   Blood in stool    Internal hemorrhoids    Asthma 04/24/2018   Chronic headaches 04/24/2018   Herpes 04/24/2018   Anemia, mild 10/09/2017   Seasonal allergies 10/09/2017   Family history of genetic disease 06/18/2017    Past Surgical History:  Procedure Laterality Date   BREAST REDUCTION SURGERY     BREAST SURGERY     CESAREAN SECTION  2018   COLONOSCOPY WITH PROPOFOL N/A 06/16/2019   Procedure: COLONOSCOPY WITH PROPOFOL;  Surgeon: Pasty Spillers, MD;  Location: [facility];  Service: Endoscopy;  Laterality: N/A;   LEEP  2015   TONSILLECTOMY     TONSILLECTOMY AND ADENOIDECTOMY  1999   WISDOM TOOTH EXTRACTION  2009    OB History   No obstetric history on file.      Home Medications    Prior to Admission medications   Medication Sig Start Date End Date Taking? Authorizing Provider  albuterol (VENTOLIN HFA) 108 (90 Base) MCG/ACT inhaler Inhale 1-2 puffs into the lungs every 6 (six) hours as  needed. 08/02/21  Yes Jacky Kindle, FNP  atomoxetine (STRATTERA) 40 MG capsule Take 40 mg by mouth 2 (two) times daily. 07/14/21  Yes [provider]  Cetirizine HCl (ZYRTEC ALLERGY) 10 MG CAPS Take 5 mg by mouth daily.    Yes [provider]  montelukast (SINGULAIR) 10 MG tablet TAKE 1 TABLET BY MOUTH EVERYDAY AT BEDTIME 07/26/21  Yes Bacigalupo, Marzella Schlein, MD  norethindrone (MICRONOR) 0.35 MG tablet Take 1 tablet by mouth daily. 06/18/21  Yes [provider]  amoxicillin-clavulanate (AUGMENTIN) 875-125 MG tablet Take 1 tablet by mouth 2 (two) times daily. 08/02/21   Jacky Kindle, FNP  Eluxadoline (VIBERZI) 100 MG TABS Take 1 tablet (100 mg total) by mouth 2 (two) times daily with a meal. 12/28/20   Bacigalupo, Marzella Schlein, MD  etonogestrel (NEXPLANON) 68 MG IMPL implant 1 each by Subdermal route once.    [provider]  fexofenadine (ALLEGRA) 180 MG tablet Take 180 mg by mouth daily.    [provider]  fluticasone (FLONASE) 50 MCG/ACT nasal spray Place 2 sprays into both nostrils daily.    [provider]  levalbuterol Pauline Aus) 0.31 MG/3ML nebulizer solution Inhale into the lungs.    [provider]  ondansetron (ZOFRAN) 4 MG tablet TAKE 1 TABLET BY MOUTH EVERY 8 HOURS AS  NEEDED FOR NAUSEA AND VOMITING 06/08/21   Erasmo Downer, MD  predniSONE (DELTASONE) 20 MG tablet Take 1 tablet (20 mg total) by mouth daily with breakfast. 08/02/21   Jacky Kindle, FNP    Family History Family History  Problem Relation Age of Onset   Hypertension Maternal Grandmother    Kidney disease Maternal Grandmother    Heart disease Maternal Grandfather     Social History Social History   Tobacco Use   Smoking status: Never   Smokeless tobacco: Never  Vaping Use   Vaping Use: Never used  Substance Use Topics   Alcohol use: Yes    Alcohol/week: 1.0 standard drink    Types: 1 Glasses of wine per week   Drug use: Never     Allergies    Sulfa antibiotics, Ciprofloxacin, Codeine, and Hydrocodone-acetaminophen   Review of Systems Review of Systems  Constitutional:  Negative for chills and fever.  Genitourinary:  Positive for pelvic pain and vaginal pain. Negative for difficulty urinating and vaginal bleeding.  Skin:  Negative for rash and wound.       + lump on L genital region    Physical Exam Triage Vital Signs ED Triage Vitals  Enc Vitals Group     BP 08/24/21 0939 127/88     Pulse Rate 08/24/21 0939 (!) 115     Resp 08/24/21 0939 16     Temp 08/24/21 0939 98.1 F (36.7 C)     Temp Source 08/24/21 0939 Oral     SpO2 08/24/21 0939 99 %     Weight --      Height --      Head Circumference --      Peak Flow --      Pain Score 08/24/21 0933 9     Pain Loc --      Pain Edu? --      Excl. in GC? --    No data found.  Updated Vital Signs BP 127/88 (BP Location: Left Arm)    Pulse (!) 115    Temp 98.1 F (36.7 C) (Oral)    Resp 16    LMP 08/06/2021    SpO2 99%   Visual Acuity Right Eye Distance:   Left Eye Distance:   Bilateral Distance:    Right Eye Near:   Left Eye Near:    Bilateral Near:     Physical Exam Vitals and nursing note (in moderate pain, sitting on her R but cheeck due to pain) reviewed.  HENT:     Right Ear: External ear normal.     Left Ear: External ear normal.  Eyes:     General: No scleral icterus.    Conjunctiva/sclera: Conjunctivae normal.  Pulmonary:     Effort: Pulmonary effort is normal.  Genitourinary:    Comments: There are no lumps noted in the inner labia, but there is a firm large mass behind the L labia extending to gluteal region. The mass feels like could be 5 cm x 5 cm and is very tender. There are no areas of redness or fluctuation.  Musculoskeletal:     Cervical back: Neck supple.  Neurological:     Mental Status: She is oriented to person, place, and time.     Gait: Gait normal.  Psychiatric:        Mood and Affect: Mood normal.        Behavior: Behavior  normal.        Thought Content: Thought content  normal.        Judgment: Judgment normal.     UC Treatments / Results  Labs (all labs ordered are listed, but only abnormal results are displayed) Labs Reviewed - No data to display  EKG   Radiology No results found.  Procedures Procedures (including critical care time)  Medications Ordered in UC Medications - No data to display  Initial Impression / Assessment and Plan / UC Course  I have reviewed the triage vital signs and the nursing notes. I explained to her that she needs US or CT to find out what this mass is and needs to go to ER to have it done today.       Final Clinical Impressions(s) / UC Diagnoses   Final diagnoses:  Pelvic mass in female     Discharge Instructions      You have a large mass deep behind the lower labia and buttocks region which does not look typical of Bartholin gland cyst. Please go to ER to have more test done, which we cant do here.     ED Prescriptions   None    PDMP not reviewed this encounter.   Garey HamRodriguez-Southworth, Oriyah Lamphear, PA-C 08/24/21 1052    Rodriguez-Southworth, [place], PA-C 08/24/21 1108

## 2021-09-04 DIAGNOSIS — N764 Abscess of vulva: Secondary | ICD-10-CM | POA: Diagnosis not present

## 2021-09-04 DIAGNOSIS — Z6828 Body mass index (BMI) 28.0-28.9, adult: Secondary | ICD-10-CM | POA: Diagnosis not present

## 2021-09-08 ENCOUNTER — Telehealth: Payer: Self-pay | Admitting: *Deleted

## 2021-09-08 NOTE — Telephone Encounter (Signed)
Transition Care Management Unsuccessful Follow-up Telephone Call  Date of discharge and from where: [facility]  08/27/21  Attempts:  1st Attempt  Reason for unsuccessful TCM follow-up call:  No answer/busy  Jacqlyn Larsen [facility], BSN RN Case Manager [phone]

## 2021-09-14 DIAGNOSIS — N764 Abscess of vulva: Secondary | ICD-10-CM | POA: Diagnosis not present

## 2021-09-26 ENCOUNTER — Ambulatory Visit
Admission: RE | Admit: 2021-09-26 | Discharge: 2021-09-26 | Disposition: A | Discharge status: Home / Self Care | Payer: BC Managed Care – PPO | Source: Ambulatory Visit | Attending: Urgent Care | Admitting: Urgent Care

## 2021-09-26 ENCOUNTER — Other Ambulatory Visit: Payer: Self-pay

## 2021-09-26 VITALS — BP 143/94 | HR 97 | Temp 98.7°F | Resp 16

## 2021-09-26 DIAGNOSIS — J0181 Other acute recurrent sinusitis: Secondary | ICD-10-CM | POA: Diagnosis not present

## 2021-09-26 DIAGNOSIS — J3089 Other allergic rhinitis: Secondary | ICD-10-CM

## 2021-09-26 DIAGNOSIS — J4540 Moderate persistent asthma, uncomplicated: Secondary | ICD-10-CM | POA: Diagnosis not present

## 2021-09-26 MED ORDER — PROMETHAZINE-DM 6.25-15 MG/5ML PO SYRP
5.0000 mL | ORAL_SOLUTION | Freq: Every evening | ORAL | 0 refills | Status: DC | PRN
Start: 2021-09-26 — End: 2021-11-23

## 2021-09-26 MED ORDER — PREDNISONE 20 MG PO TABS: ORAL_TABLET | ORAL | 0 refills | Status: DC

## 2021-09-26 MED ORDER — BENZONATATE 100 MG PO CAPS: 100.0000 mg | ORAL_CAPSULE | Freq: Three times a day (TID) | ORAL | 0 refills | Status: DC | PRN

## 2021-09-26 MED ORDER — DOXYCYCLINE HYCLATE 100 MG PO CAPS: 100.0000 mg | ORAL_CAPSULE | Freq: Two times a day (BID) | ORAL | 0 refills | Status: DC

## 2021-09-26 NOTE — ED Triage Notes (Signed)
Pt presents with cough, SOB, and chest congestion x 2 weeks. She has bilateral eye irritation that started today.

## 2021-09-26 NOTE — ED Provider Notes (Signed)
Melanie Valdez   MRN: [id number] DOB: 1991-08-12  Subjective:   Melanie Valdez is a 31 y.o. female presenting for 2-week history of persistent chest congestion, shortness of breath, coughing, throat pain, postnasal drainage, persistent sinus pressure and congestion.  Patient has a history of chronic allergic rhinitis, asthma.  She has been using all of her medications for her allergies and her asthma.  Denies any particular chest pain, hemoptysis.  No current facility-administered medications for this encounter.  Current Outpatient Medications:    albuterol (VENTOLIN HFA) 108 (90 Base) MCG/ACT inhaler, Inhale 1-2 puffs into the lungs every 6 (six) hours as needed., Disp: 1 each, Rfl: 1   amoxicillin-clavulanate (AUGMENTIN) 875-125 MG tablet, Take 1 tablet by mouth 2 (two) times daily., Disp: 20 tablet, Rfl: 0   atomoxetine (STRATTERA) 40 MG capsule, Take 40 mg by mouth 2 (two) times daily., Disp: , Rfl:    Cetirizine HCl (ZYRTEC ALLERGY) 10 MG CAPS, Take 5 mg by mouth daily. , Disp: , Rfl:    Eluxadoline (VIBERZI) 100 MG TABS, Take 1 tablet (100 mg total) by mouth 2 (two) times daily with a meal., Disp: 60 tablet, Rfl: 1   etonogestrel (NEXPLANON) 68 MG IMPL implant, 1 each by Subdermal route once., Disp: , Rfl:    fexofenadine (ALLEGRA) 180 MG tablet, Take 180 mg by mouth daily., Disp: , Rfl:    fluticasone (FLONASE) 50 MCG/ACT nasal spray, Place 2 sprays into both nostrils daily., Disp: , Rfl:    levalbuterol (XOPENEX) 0.31 MG/3ML nebulizer solution, Inhale into the lungs., Disp: , Rfl:    montelukast (SINGULAIR) 10 MG tablet, TAKE 1 TABLET BY MOUTH EVERYDAY AT BEDTIME, Disp: 90 tablet, Rfl: 1   norethindrone (MICRONOR) 0.35 MG tablet, Take 1 tablet by mouth daily., Disp: , Rfl:    ondansetron (ZOFRAN) 4 MG tablet, TAKE 1 TABLET BY MOUTH EVERY 8 HOURS AS NEEDED FOR NAUSEA AND VOMITING, Disp: 18 tablet, Rfl: 1   predniSONE (DELTASONE) 20 MG tablet, Take 1 tablet (20 mg total) by  mouth daily with breakfast., Disp: 7 tablet, Rfl: 0   Allergies  Allergen Reactions   Sulfa Antibiotics Anaphylaxis and Itching   Ciprofloxacin     Other reaction(s): rash/itching   Codeine Nausea And Vomiting   Hydrocodone-Acetaminophen Itching    Other reaction(s): mild rash/itching    Past Medical History:  Diagnosis Date   Asthma    Complication of anesthesia    PVC's (premature ventricular contractions)      Past Surgical History:  Procedure Laterality Date   BREAST REDUCTION SURGERY     BREAST SURGERY     CESAREAN SECTION  2018   COLONOSCOPY WITH PROPOFOL N/A 06/16/2019   Procedure: COLONOSCOPY WITH PROPOFOL;  Surgeon: Pasty Spillers, MD;  Location: [facility];  Service: Endoscopy;  Laterality: N/A;   LEEP  2015   TONSILLECTOMY     TONSILLECTOMY AND ADENOIDECTOMY  1999   WISDOM TOOTH EXTRACTION  2009    Family History  Problem Relation Age of Onset   Hypertension Maternal Grandmother    Kidney disease Maternal Grandmother    Heart disease Maternal Grandfather     Social History   Tobacco Use   Smoking status: Never   Smokeless tobacco: Never  Vaping Use   Vaping Use: Never used  Substance Use Topics   Alcohol use: Yes    Alcohol/week: 1.0 standard drink    Types: 1 Glasses of wine per week   Drug use: Never  ROS   Objective:   Vitals: BP (!) 143/94 (BP Location: Left Arm)    Pulse 97    Temp 98.7 F (37.1 C) (Oral)    Resp 16    LMP  (LMP Unknown)    SpO2 100%   Physical Exam Constitutional:      General: She is not in acute distress.    Appearance: Normal appearance. She is well-developed and normal weight. She is not ill-appearing, toxic-appearing or diaphoretic.  HENT:     Head: Normocephalic and atraumatic.     Right Ear: Tympanic membrane, ear canal and external ear normal. No drainage or tenderness. No middle ear effusion. There is no impacted cerumen. Tympanic membrane is not erythematous.     Left Ear: Tympanic membrane,  ear canal and external ear normal. No drainage or tenderness.  No middle ear effusion. There is no impacted cerumen. Tympanic membrane is not erythematous.     Nose: Congestion and rhinorrhea present.     Mouth/Throat:     Mouth: Mucous membranes are moist. No oral lesions.     Pharynx: No pharyngeal swelling, oropharyngeal exudate, posterior oropharyngeal erythema or uvula swelling.     Tonsils: No tonsillar exudate or tonsillar abscesses.     Comments: Overlying thick postnasal drainage overlying the pharynx. Eyes:     General: No scleral icterus.       Right eye: No discharge.        Left eye: No discharge.     Extraocular Movements: Extraocular movements intact.     Right eye: Normal extraocular motion.     Left eye: Normal extraocular motion.     Conjunctiva/sclera: Conjunctivae normal.  Cardiovascular:     Rate and Rhythm: Normal rate.     Heart sounds: No murmur heard.   No friction rub. No gallop.  Pulmonary:     Effort: Pulmonary effort is normal. No respiratory distress.     Breath sounds: No stridor. No decreased breath sounds, wheezing, rhonchi or rales.  Chest:     Chest wall: No tenderness.  Musculoskeletal:     Cervical back: Normal range of motion and neck supple.  Lymphadenopathy:     Cervical: No cervical adenopathy.  Skin:    General: Skin is warm and dry.  Neurological:     General: No focal deficit present.     Mental Status: She is alert and oriented to person, place, and time.  Psychiatric:        Mood and Affect: Mood normal.        Behavior: Behavior normal.    Assessment and Plan :   PDMP not reviewed this encounter.  1. Other acute recurrent sinusitis   2. Allergic rhinitis due to other allergic trigger, unspecified seasonality   3. Moderate persistent asthma without complication    Will start empiric treatment for sinusitis with doxycycline given the shortage of amoxicillin at pharmacies.  Recommended an oral prednisone course as well in the  context of her chronic allergic rhinitis and asthma.  Deferred respiratory testing given timeline of her illness.   Deferred imaging given clear cardiopulmonary exam, hemodynamically stable vital signs. Counseled patient on potential for adverse effects with medications prescribed/recommended today, ER and return-to-clinic precautions discussed, patient verbalized understanding.    Wallis Bamberg, [place] 09/26/21 1548

## 2021-10-16 ENCOUNTER — Encounter: Payer: Self-pay | Admitting: Family Medicine

## 2021-10-16 NOTE — Telephone Encounter (Signed)
Ok to send in Valtrex 1000mg  daily x5d #5 r2 for acute outbreaks.

## 2021-10-17 MED ORDER — VALACYCLOVIR HCL 1 G PO TABS: 1000.0000 mg | ORAL_TABLET | Freq: Every day | ORAL | 2 refills | Status: DC

## 2021-10-17 NOTE — Telephone Encounter (Signed)
Pt advised and stated shed like it to go to [facility] in [place]  ?

## 2021-11-06 ENCOUNTER — Encounter: Payer: Self-pay | Admitting: Family Medicine

## 2021-11-07 DIAGNOSIS — H66002 Acute suppurative otitis media without spontaneous rupture of ear drum, left ear: Secondary | ICD-10-CM | POA: Diagnosis not present

## 2021-11-22 NOTE — Progress Notes (Signed)
?  ? ?I,Sulibeya S Dimas,acting as a scribe for Shirlee Latch, MD.,have documented all relevant documentation on the behalf of Shirlee Latch, MD,as directed by  Shirlee Latch, MD while in the presence of Shirlee Latch, MD. ? ? ?Established patient visit ? ? ?Patient: Melanie Valdez   DOB: 12-04-1990   31 y.o. Female  MRN: [id number] ?Visit Date: 11/23/2021 ? ?Today's healthcare provider: Shirlee Latch, MD  ? ?Chief Complaint  ?Patient presents with  ? Adenopathy  ? ?Subjective  ?  ?HPI  ?Patient here C/O lymph node in left axillary is swollen and hurts x 3 weeks. [facility] and asked the NP to look at it. NP said it feels like it is a pocket of fluid (cyst). Patient reports she just had a pocket of fluid removed in January from perineal area. And she is afraid this is the same thing. Patient is on ABX for ear infection and hoping that it will help.   Hasn't changed s/p abx for AOM. ? ?No nipple changes, discharge. Both breasts hurt (she knows that she is not pregnant). ? ?Family history of breast cancer in maternal grandmother's sister and cousin ? ?Medications: ?Outpatient Medications Prior to Visit  ?Medication Sig  ? albuterol (VENTOLIN HFA) 108 (90 Base) MCG/ACT inhaler Inhale 1-2 puffs into the lungs every 6 (six) hours as needed.  ? atomoxetine (STRATTERA) 40 MG capsule Take 40 mg by mouth 2 (two) times daily.  ? Cetirizine HCl (ZYRTEC ALLERGY) 10 MG CAPS Take 5 mg by mouth daily.   ? fexofenadine (ALLEGRA) 180 MG tablet Take 180 mg by mouth daily.  ? fluticasone (FLONASE) 50 MCG/ACT nasal spray Place 2 sprays into both nostrils daily.  ? montelukast (SINGULAIR) 10 MG tablet TAKE 1 TABLET BY MOUTH EVERYDAY AT BEDTIME  ? norethindrone (MICRONOR) 0.35 MG tablet Take 1 tablet by mouth daily.  ? [DISCONTINUED] benzonatate (TESSALON) 100 MG capsule Take 1-2 capsules (100-200 mg total) by mouth 3 (three) times daily as needed for cough.  ?  [DISCONTINUED] doxycycline (VIBRAMYCIN) 100 MG capsule Take 1 capsule (100 mg total) by mouth 2 (two) times daily.  ? [DISCONTINUED] Eluxadoline (VIBERZI) 100 MG TABS Take 1 tablet (100 mg total) by mouth 2 (two) times daily with a meal.  ? [DISCONTINUED] etonogestrel (NEXPLANON) 68 MG IMPL implant 1 each by Subdermal route once.  ? [DISCONTINUED] levalbuterol (XOPENEX) 0.31 MG/3ML nebulizer solution Inhale into the lungs.  ? [DISCONTINUED] ondansetron (ZOFRAN) 4 MG tablet TAKE 1 TABLET BY MOUTH EVERY 8 HOURS AS NEEDED FOR NAUSEA AND VOMITING  ? [DISCONTINUED] predniSONE (DELTASONE) 20 MG tablet Take 2 tablets daily with breakfast.  ? [DISCONTINUED] promethazine-dextromethorphan (PROMETHAZINE-DM) 6.25-15 MG/5ML syrup Take 5 mLs by mouth at bedtime as needed for cough.  ? ?No facility-administered medications prior to visit.  ? ? ?Review of Systems  ?Constitutional:  Negative for appetite change, chills and fever.  ?Respiratory:  Negative for shortness of breath.   ?Cardiovascular:  Negative for chest pain.  ?Gastrointestinal:  Negative for abdominal pain, nausea and vomiting.  ?Hematological:  Positive for adenopathy.  ? ? ?  Objective  ?  ?BP 130/86 (BP Location: Left Arm, Patient Position: Sitting, Cuff Size: Large)   Pulse 93   Temp 98.4 ?F (36.9 ?C) (Oral)   Resp 16   Wt 160 lb (72.6 kg)   LMP 10/10/2021 (Approximate)   BMI 28.34 kg/m?  ? ? ?Physical Exam ?Vitals reviewed.  ?Constitutional:   ?  General: She is not in acute distress. ?   Appearance: She is well-developed.  ?HENT:  ?   Head: Normocephalic and atraumatic.  ?Eyes:  ?   General: No scleral icterus. ?   Conjunctiva/sclera: Conjunctivae normal.  ?Cardiovascular:  ?   Rate and Rhythm: Normal rate and regular rhythm.  ?Pulmonary:  ?   Effort: Pulmonary effort is normal. No respiratory distress.  ?Chest:  ?   Chest wall: No mass or deformity.  ?Breasts: ?   Breasts are symmetrical.  ?   Right: Tenderness present.  ?   Left: Tenderness present.  ?    Comments: Breasts: breasts appear normal, no suspicious masses, no skin or nipple changes or axillary nodes. Tenderness of L axilla without any fluctuance or induration. No signs of cystic lesion either  ?Lymphadenopathy:  ?   Upper Body:  ?   Right upper body: No supraclavicular or axillary adenopathy.  ?   Left upper body: No supraclavicular or axillary adenopathy.  ?Skin: ?   General: Skin is warm and dry.  ?   Findings: No rash.  ?Neurological:  ?   Mental Status: She is alert and oriented to person, place, and time.  ?Psychiatric:     ?   Behavior: Behavior normal.  ?  ? ? ?Results for orders placed or performed in visit on 11/23/21  ?HM PAP SMEAR  ?Result Value Ref Range  ? HM Pap smear negative   ? ? Assessment & Plan  ?  ? ?1. Mastalgia ?2. Axillary tenderness, left ?- new problem ?- may be 2/2 hormonal contraception ?- suspect axillary tenderness is related to mastalgia as she seems to have some axillary breast tissue ?- will obtain diagnostic mammo and [place]  ? ?- US BREAST LTD UNI RIGHT INC AXILLA; Future ?- US BREAST LTD UNI LEFT INC AXILLA; Future ?- MM DIAG BREAST TOMO BILATERAL; Future  ? ?Return if symptoms worsen or fail to improve.  ?   ? ?I, Shirlee Latch, MD, have reviewed all documentation for this visit. The documentation on 11/23/21 for the exam, diagnosis, procedures, and orders are all accurate and complete. ? ? ?Erasmo Downer, MD, MPH ?[facility]   ?

## 2021-11-23 ENCOUNTER — Encounter: Payer: Self-pay | Admitting: Family Medicine

## 2021-11-23 ENCOUNTER — Ambulatory Visit (INDEPENDENT_AMBULATORY_CARE_PROVIDER_SITE_OTHER): Payer: BC Managed Care – PPO | Admitting: Family Medicine

## 2021-11-23 VITALS — BP 130/86 | HR 93 | Temp 98.4°F | Resp 16 | Wt 160.0 lb

## 2021-11-23 DIAGNOSIS — N644 Mastodynia: Secondary | ICD-10-CM

## 2021-11-23 DIAGNOSIS — M79622 Pain in left upper arm: Secondary | ICD-10-CM

## 2021-12-11 ENCOUNTER — Ambulatory Visit
Admission: RE | Admit: 2021-12-11 | Discharge: 2021-12-11 | Disposition: A | Discharge status: Home / Self Care | Payer: BC Managed Care – PPO | Source: Ambulatory Visit | Attending: Family Medicine | Admitting: Family Medicine

## 2021-12-11 DIAGNOSIS — M79622 Pain in left upper arm: Secondary | ICD-10-CM | POA: Insufficient documentation

## 2021-12-11 DIAGNOSIS — N644 Mastodynia: Secondary | ICD-10-CM

## 2021-12-11 DIAGNOSIS — R928 Other abnormal and inconclusive findings on diagnostic imaging of breast: Secondary | ICD-10-CM | POA: Diagnosis not present

## 2021-12-11 DIAGNOSIS — N6002 Solitary cyst of left breast: Secondary | ICD-10-CM | POA: Diagnosis not present

## 2021-12-19 DIAGNOSIS — H9012 Conductive hearing loss, unilateral, left ear, with unrestricted hearing on the contralateral side: Secondary | ICD-10-CM | POA: Diagnosis not present

## 2021-12-19 DIAGNOSIS — H6122 Impacted cerumen, left ear: Secondary | ICD-10-CM | POA: Diagnosis not present

## 2021-12-19 DIAGNOSIS — H902 Conductive hearing loss, unspecified: Secondary | ICD-10-CM | POA: Diagnosis not present

## 2021-12-19 DIAGNOSIS — D481 Neoplasm of uncertain behavior of connective and other soft tissue: Secondary | ICD-10-CM | POA: Diagnosis not present

## 2021-12-29 DIAGNOSIS — H938X2 Other specified disorders of left ear: Secondary | ICD-10-CM | POA: Diagnosis not present

## 2022-02-12 ENCOUNTER — Other Ambulatory Visit: Payer: Self-pay | Admitting: Family Medicine

## 2022-02-20 ENCOUNTER — Encounter: Payer: BC Managed Care – PPO | Admitting: Family Medicine

## 2022-03-15 NOTE — Progress Notes (Deleted)
    I,Sha'taria Marquinn Meschke,acting as a Neurosurgeon for [employer], PA-C.,have documented all relevant documentation on the behalf of Alfredia Ferguson, PA-C,as directed by  Alfredia Ferguson, PA-C while in the presence of Alfredia Ferguson, PA-C.  Established patient visit   Patient: Melanie Valdez   DOB: 01/27/1991   31 y.o. Female  MRN: [id number] Visit Date: 03/16/2022  Today's healthcare provider: Alfredia Ferguson, PA-C   No chief complaint on file.  Subjective    HPI  Patient is in today to receive refills on her birth control. Contraception Counseling: Patient presents for contraception counseling. The patient has no complaints today. The patient {sys sexually active:13135} sexually active. Pertinent past medical history: {contraception pert [id number].   Medications: Outpatient Medications Prior to Visit  Medication Sig   albuterol (VENTOLIN HFA) 108 (90 Base) MCG/ACT inhaler Inhale 1-2 puffs into the lungs every 6 (six) hours as needed.   atomoxetine (STRATTERA) 40 MG capsule Take 40 mg by mouth 2 (two) times daily.   Cetirizine HCl (ZYRTEC ALLERGY) 10 MG CAPS Take 5 mg by mouth daily.    fexofenadine (ALLEGRA) 180 MG tablet Take 180 mg by mouth daily.   fluticasone (FLONASE) 50 MCG/ACT nasal spray Place 2 sprays into both nostrils daily.   montelukast (SINGULAIR) 10 MG tablet Take 1 tablet (10 mg total) by mouth at bedtime. Please schedule office visit before any future refill.   norethindrone (MICRONOR) 0.35 MG tablet Take 1 tablet by mouth daily.   No facility-administered medications prior to visit.    Review of Systems  {Labs  Heme  Chem  Endocrine  Serology  Results Review (optional):23779}   Objective    There were no vitals taken for this visit. {Show previous vital signs (optional):23777}  Physical Exam  ***  No results found for any visits on 03/16/22.  Assessment & Plan     ***  No follow-ups on file.      {provider attestation***:1}   Alfredia Ferguson, PA-C   [facility] [phone] (phone) [phone] (fax)  [facility]

## 2022-03-16 ENCOUNTER — Ambulatory Visit: Payer: 59 | Admitting: Physician Assistant

## 2022-03-16 ENCOUNTER — Other Ambulatory Visit: Payer: Self-pay | Admitting: Physician Assistant

## 2022-03-16 DIAGNOSIS — Z309 Encounter for contraceptive management, unspecified: Secondary | ICD-10-CM

## 2022-03-16 MED ORDER — NORETHINDRONE 0.35 MG PO TABS: 1.0000 | ORAL_TABLET | Freq: Every day | ORAL | 2 refills | Status: DC

## 2022-03-21 ENCOUNTER — Encounter (INDEPENDENT_AMBULATORY_CARE_PROVIDER_SITE_OTHER): Payer: Self-pay

## 2022-05-21 ENCOUNTER — Ambulatory Visit (INDEPENDENT_AMBULATORY_CARE_PROVIDER_SITE_OTHER): Payer: 59 | Admitting: Family Medicine

## 2022-05-21 ENCOUNTER — Encounter: Payer: Self-pay | Admitting: Family Medicine

## 2022-05-21 VITALS — BP 121/85 | HR 108 | Temp 98.0°F | Resp 16 | Ht 63.0 in | Wt 164.0 lb

## 2022-05-21 DIAGNOSIS — L650 Telogen effluvium: Secondary | ICD-10-CM | POA: Diagnosis not present

## 2022-05-21 DIAGNOSIS — Z309 Encounter for contraceptive management, unspecified: Secondary | ICD-10-CM

## 2022-05-21 DIAGNOSIS — E663 Overweight: Secondary | ICD-10-CM

## 2022-05-21 DIAGNOSIS — Z0000 Encounter for general adult medical examination without abnormal findings: Secondary | ICD-10-CM

## 2022-05-21 DIAGNOSIS — R030 Elevated blood-pressure reading, without diagnosis of hypertension: Secondary | ICD-10-CM | POA: Insufficient documentation

## 2022-05-21 DIAGNOSIS — Z803 Family history of malignant neoplasm of breast: Secondary | ICD-10-CM

## 2022-05-21 MED ORDER — ATOMOXETINE HCL 40 MG PO CAPS: 40.0000 mg | ORAL_CAPSULE | Freq: Two times a day (BID) | ORAL | 3 refills | Status: DC

## 2022-05-21 MED ORDER — NORETHINDRONE 0.35 MG PO TABS: 1.0000 | ORAL_TABLET | Freq: Every day | ORAL | 2 refills | Status: DC

## 2022-05-21 NOTE — Progress Notes (Signed)
I,Sulibeya S Dimas,acting as a Education administrator for Lavon Paganini, MD.,have documented all relevant documentation on the behalf of Lavon Paganini, MD,as directed by  Lavon Paganini, MD while in the presence of Lavon Paganini, MD.    Complete physical exam   Patient: Melanie Valdez   DOB: 1991/05/12   31 y.o. Female  MRN: [id number] Visit Date: 05/21/2022  Today's healthcare provider: Lavon Paganini, MD   Chief Complaint  Patient presents with   Annual Exam   Subjective    Melanie Valdez is a 31 y.o. female who presents today for a complete physical exam.  She reports consuming a general diet. Gym/ health club routine includes cardio and mod to heavy weightlifting. She generally feels well. She reports sleeping well. She does have additional problems to discuss today.  HPI  Patient would like to go over other options for birth control.   Grandmother was diagnosed with breast cancer. MGM. Wants BRCA testing.  Wants to get Nexplanon - liked it in the past. Had no problems with it then.  6 wks of excessive hair loss. Feels like postpartum hair loss. No recent illness, no new medicines.  No bald patches.  Has been online management for ADHD meds.  Taking Strattera.  Well controlled.  Past Medical History:  Diagnosis Date   Asthma    Complication of anesthesia    PVC's (premature ventricular contractions)    Past Surgical History:  Procedure Laterality Date   BREAST REDUCTION SURGERY     BREAST SURGERY     CESAREAN SECTION  2018   COLONOSCOPY WITH PROPOFOL N/A 06/16/2019   Procedure: COLONOSCOPY WITH PROPOFOL;  Surgeon: Virgel Manifold, MD;  Location: [facility] ENDOSCOPY;  Service: Endoscopy;  Laterality: N/A;   LEEP  2015   REDUCTION MAMMAPLASTY     TONSILLECTOMY     TONSILLECTOMY AND ADENOIDECTOMY  1999   WISDOM TOOTH EXTRACTION  2009   Social History   Socioeconomic History   Marital status: Married    Spouse name: Not on file   Number of children: Not on  file   Years of education: Not on file   Highest education level: Not on file  Occupational History   Not on file  Tobacco Use   Smoking status: Never   Smokeless tobacco: Never  Vaping Use   Vaping Use: Never used  Substance and Sexual Activity   Alcohol use: Yes    Alcohol/week: 1.0 standard drink of alcohol    Types: 1 Glasses of wine per week   Drug use: Never   Sexual activity: Not on file  Other Topics Concern   Not on file  Social History Narrative   Not on file   Social Determinants of Health   Financial Resource Strain: Not on file  Food Insecurity: Not on file  Transportation Needs: Not on file  Physical Activity: Not on file  Stress: Not on file  Social Connections: Not on file  Intimate Partner Violence: Not on file   Family Status  Relation Name Status   Mother  Alive   Father  Alive   MGM  Alive   MGF  Deceased   Other  (Not Specified)   Other  Alive   Other  Alive   Family History  Problem Relation Age of Onset   Hypertension Maternal Grandmother    Kidney disease Maternal Grandmother    Breast cancer Maternal Grandmother    Heart disease Maternal Grandfather    Breast cancer Other  MGGGM   Breast cancer Other        Mgreataunt   Breast cancer Other        m 2nd cousin   Allergies  Allergen Reactions   Sulfa Antibiotics Anaphylaxis and Itching   Ciprofloxacin     Other reaction(s): rash/itching   Codeine Nausea And Vomiting   Hydrocodone-Acetaminophen Itching    Other reaction(s): mild rash/itching    Patient Care Team: Virginia Crews, MD as PCP - General (Family Medicine) Kate Sable, MD as PCP - Cardiology (Cardiology)   Medications: Outpatient Medications Prior to Visit  Medication Sig   albuterol (VENTOLIN HFA) 108 (90 Base) MCG/ACT inhaler Inhale 1-2 puffs into the lungs every 6 (six) hours as needed.   Cetirizine HCl (ZYRTEC ALLERGY) 10 MG CAPS Take 5 mg by mouth daily.    fexofenadine (ALLEGRA) 180 MG  tablet Take 180 mg by mouth daily.   fluticasone (FLONASE) 50 MCG/ACT nasal spray Place 2 sprays into both nostrils daily.   Loperamide HCl (IMODIUM PO) Take by mouth as needed.   montelukast (SINGULAIR) 10 MG tablet Take 1 tablet (10 mg total) by mouth at bedtime. Please schedule office visit before any future refill.   OMEPRAZOLE PO Take by mouth as needed.   [DISCONTINUED] atomoxetine (STRATTERA) 40 MG capsule Take 40 mg by mouth 2 (two) times daily.   [DISCONTINUED] norethindrone (MICRONOR) 0.35 MG tablet Take 1 tablet (0.35 mg total) by mouth daily.   No facility-administered medications prior to visit.    Review of Systems  HENT:  Positive for ear pain.   Gastrointestinal:  Positive for diarrhea.  Psychiatric/Behavioral:  The patient is hyperactive.       Objective    BP 121/85 (BP Location: Left Arm, Patient Position: Sitting, Cuff Size: Large)   Pulse (!) 108   Temp 98 F (36.7 C) (Oral)   Resp 16   Ht 5' 3"  (1.6 m)   Wt 164 lb (74.4 kg)   BMI 29.05 kg/m     Physical Exam Vitals reviewed.  Constitutional:      General: She is not in acute distress.    Appearance: Normal appearance. She is well-developed. She is not diaphoretic.  HENT:     Head: Normocephalic and atraumatic.     Right Ear: Tympanic membrane, ear canal and external ear normal.     Left Ear: Tympanic membrane, ear canal and external ear normal.     Nose: Nose normal.     Mouth/Throat:     Mouth: Mucous membranes are moist.     Pharynx: Oropharynx is clear. No oropharyngeal exudate.  Eyes:     General: No scleral icterus.    Conjunctiva/sclera: Conjunctivae normal.     Pupils: Pupils are equal, round, and reactive to light.  Neck:     Thyroid: No thyromegaly.  Cardiovascular:     Rate and Rhythm: Normal rate and regular rhythm.     Pulses: Normal pulses.     Heart sounds: Normal heart sounds. No murmur heard. Pulmonary:     Effort: Pulmonary effort is normal. No respiratory distress.      Breath sounds: Normal breath sounds. No wheezing or rales.  Abdominal:     General: There is no distension.     Palpations: Abdomen is soft.     Tenderness: There is no abdominal tenderness.  Musculoskeletal:        General: No deformity.     Cervical back: Neck supple.     Right  lower leg: No edema.     Left lower leg: No edema.  Lymphadenopathy:     Cervical: No cervical adenopathy.  Skin:    General: Skin is warm and dry.     Findings: No rash.  Neurological:     Mental Status: She is alert and oriented to person, place, and time. Mental status is at baseline.     Sensory: No sensory deficit.     Motor: No weakness.     Gait: Gait normal.  Psychiatric:        Mood and Affect: Mood normal.        Behavior: Behavior normal.        Thought Content: Thought content normal.       Last depression screening scores    05/21/2022    2:32 PM 11/23/2021    9:36 AM 08/02/2021    2:30 PM  PHQ 2/9 Scores  PHQ - 2 Score 0 0 0  PHQ- 9 Score 0 0 1   Last fall risk screening    05/21/2022    2:31 PM  [place] in the past year? 0  Number falls in past yr: 0  Injury with Fall? 0  Risk for fall due to : No Fall Risks  Follow up Falls evaluation completed   Last Audit-C alcohol use screening    05/21/2022    2:32 PM  Alcohol Use Disorder Test (AUDIT)  1. How often do you have a drink containing alcohol? 2  2. How many drinks containing alcohol do you have on a typical day when you are drinking? 0  3. How often do you have six or more drinks on one occasion? 0  AUDIT-C Score 2   A score of 3 or more in women, and 4 or more in men indicates increased risk for alcohol abuse, EXCEPT if all of the points are from question 1   No results found for any visits on 05/21/22.  Assessment & Plan    Routine Health Maintenance and Physical Exam  Exercise Activities and Dietary recommendations  Goals   None     Immunization History  Administered Date(s) Administered    DTaP 09/10/1989, 11/12/1989, 01/14/1990, 10/21/1990, 07/19/1993   HIB (PRP-OMP) 09/10/1989, 11/12/1989, 01/14/1990, 10/21/1990   Hepatitis B 08/10/1999, 09/11/1999, 01/19/2000   IPV 09/10/1989, 11/12/1989, 10/21/1990, 07/19/1993   Influenza,inj,Quad PF,6+ Mos 05/01/2017   Influenza-Unspecified 04/28/2018, 05/11/2020   MMR 10/21/1990, 07/19/1994   Meningococcal Conjugate 03/06/2004   PFIZER(Purple Top)SARS-COV-2 Vaccination 11/05/2019, 11/26/2019   PPD Test 03/16/2021   Pfizer Covid-19 Vaccine Bivalent Booster 34yr & up 07/27/2021   Td 03/06/2004   Tdap 05/01/2017, 09/15/2018    Health Maintenance  Topic Date Due   INFLUENZA VACCINE  11/11/2022 (Originally 03/13/2022)   PAP SMEAR-Modifier  03/30/2024   TETANUS/TDAP  09/15/2028   COVID-19 Vaccine  Completed   Hepatitis C Screening  Completed   HIV Screening  Completed   HPV VACCINES  Aged Out    Discussed health benefits of physical activity, and encouraged her to engage in regular exercise appropriate for her age and condition.  Problem List Items Addressed This Visit       Musculoskeletal and Integument   Telogen effluvium    Discussed potential etiologies No signs of alopecia Will check labs May consider rogaine Consider Derm ref if not improving in ~3 months      Relevant Orders   Comprehensive metabolic panel   CBC   TSH  VITAMIN D 25 Hydroxy (Vit-D Deficiency, Fractures)     Other   Overweight    Discussed importance of healthy weight management Discussed diet and exercise       Relevant Orders   Lipid panel   Comprehensive metabolic panel   CBC   TSH   VITAMIN D 25 Hydroxy (Vit-D Deficiency, Fractures)   Elevated blood-pressure reading without diagnosis of hypertension    Discussed lifestyle interventions H/o gestational HTN Consider meds in the future if still elevated, though better on recheck      Relevant Orders   Lipid panel   Comprehensive metabolic panel   CBC   TSH   Other Visit  Diagnoses     Encounter for annual physical exam    -  Primary   Relevant Orders   Lipid panel   Comprehensive metabolic panel   CBC   TSH   VITAMIN D 25 Hydroxy (Vit-D Deficiency, Fractures)   Encounter for contraceptive management, unspecified type       Relevant Medications   norethindrone (MICRONOR) 0.35 MG tablet   Family history of breast cancer       Relevant Orders   BRCAssure Comprehensive Panel        Return in about 3 months (around 08/21/2022) for chronic disease f/u.     I, Lavon Paganini, MD, have reviewed all documentation for this visit. The documentation on 05/21/22 for the exam, diagnosis, procedures, and orders are all accurate and complete.   Lyndel Sarate, Dionne Bucy, MD, MPH [facility]

## 2022-05-21 NOTE — Assessment & Plan Note (Signed)
Discussed lifestyle interventions H/o gestational HTN Consider meds in the future if still elevated, though better on recheck

## 2022-05-21 NOTE — Assessment & Plan Note (Signed)
Discussed importance of healthy weight management Discussed diet and exercise  

## 2022-05-21 NOTE — Assessment & Plan Note (Signed)
Discussed potential etiologies No signs of alopecia Will check labs May consider rogaine Consider Derm ref if not improving in ~3 months

## 2022-05-22 ENCOUNTER — Encounter: Payer: Self-pay | Admitting: Family Medicine

## 2022-06-04 ENCOUNTER — Telehealth: Payer: Self-pay

## 2022-06-04 NOTE — Telephone Encounter (Signed)
Form completed and faxed today.

## 2022-06-04 NOTE — Telephone Encounter (Signed)
Copied from [place] [id number]. Topic: General - Other >> Jun 04, 2022  1:56 PM Melanie Valdez wrote: Reason for CRM: [facility] from [place] says received genetic testing from Dr Brita Romp and , they need the info of history of cancer for the patient. Ref #[id number]

## 2022-06-14 ENCOUNTER — Ambulatory Visit (INDEPENDENT_AMBULATORY_CARE_PROVIDER_SITE_OTHER): Payer: 59 | Admitting: Family Medicine

## 2022-06-14 ENCOUNTER — Ambulatory Visit: Payer: 59 | Admitting: Family Medicine

## 2022-06-14 ENCOUNTER — Encounter: Payer: Self-pay | Admitting: Family Medicine

## 2022-06-14 VITALS — BP 122/88 | HR 94 | Temp 98.2°F | Resp 16 | Wt 162.0 lb

## 2022-06-14 DIAGNOSIS — Z3169 Encounter for other general counseling and advice on procreation: Secondary | ICD-10-CM | POA: Diagnosis not present

## 2022-06-14 DIAGNOSIS — H9202 Otalgia, left ear: Secondary | ICD-10-CM | POA: Diagnosis not present

## 2022-06-14 DIAGNOSIS — F909 Attention-deficit hyperactivity disorder, unspecified type: Secondary | ICD-10-CM

## 2022-06-14 NOTE — Progress Notes (Signed)
I,April Miller,acting as a scribe for Lavon Paganini, MD.,have documented all relevant documentation on the behalf of Lavon Paganini, MD,as directed by  Lavon Paganini, MD while in the presence of Lavon Paganini, MD.   Established patient visit   Patient: Melanie Valdez   DOB: 1990/12/18   31 y.o. Female  MRN: [id number] Visit Date: 06/14/2022  Today's healthcare provider: Lavon Paganini, MD   Chief Complaint  Patient presents with   Ear Drainage    Left   Ear Pain   Medication Management   Subjective    HPI HPI     Ear Drainage    Additional comments: Left      Last edited by Julieta Bellini, CMA on 06/14/2022  9:55 AM.      Patient is having left ear drainage and pain.  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  Medications: Outpatient Medications Prior to Visit  Medication Sig   albuterol (VENTOLIN HFA) 108 (90 Base) MCG/ACT inhaler Inhale 1-2 puffs into the lungs every 6 (six) hours as needed.   atomoxetine (STRATTERA) 40 MG capsule Take 1 capsule (40 mg total) by mouth 2 (two) times daily.   Cetirizine HCl (ZYRTEC ALLERGY) 10 MG CAPS Take 5 mg by mouth daily.    fexofenadine (ALLEGRA) 180 MG tablet Take 180 mg by mouth daily.   fluticasone (FLONASE) 50 MCG/ACT nasal spray Place 2 sprays into both nostrils daily.   Loperamide HCl (IMODIUM PO) Take by mouth as needed.   montelukast (SINGULAIR) 10 MG tablet Take 1 tablet (10 mg total) by mouth at bedtime. Please schedule office visit before any future refill.   OMEPRAZOLE PO Take by mouth as needed.   [DISCONTINUED] norethindrone (MICRONOR) 0.35 MG tablet Take 1 tablet (0.35 mg total) by mouth daily.   No facility-administered medications prior to visit.    Review of Systems per HPI     Objective    BP 122/88 (BP Location: Left Arm, Patient Position: Sitting, Cuff Size: Normal)   Pulse 94   Temp 98.2 F (36.8 C) (Temporal)   Resp  16   Wt 162 lb (73.5 kg)   SpO2 98%   BMI 28.70 kg/m    Physical Exam Vitals reviewed.  Constitutional:      General: She is not in acute distress.    Appearance: Normal appearance. She is well-developed. She is not diaphoretic.  HENT:     Head: Normocephalic and atraumatic.     Right Ear: Tympanic membrane, ear canal and external ear normal.     Left Ear: Tympanic membrane, ear canal and external ear normal.     Nose: Nose normal.     Mouth/Throat:     Mouth: Mucous membranes are moist.     Pharynx: Oropharynx is clear.  Eyes:     General: No scleral icterus.    Conjunctiva/sclera: Conjunctivae normal.  Neck:     Thyroid: No thyromegaly.  Cardiovascular:     Rate and Rhythm: Normal rate and regular rhythm.     Pulses: Normal pulses.     Heart sounds: Normal heart sounds. No murmur heard. Pulmonary:     Effort: Pulmonary effort is normal. No respiratory distress.     Breath sounds: Normal breath sounds. No wheezing, rhonchi or rales.  Musculoskeletal:     Cervical back: Neck supple.     Right lower leg: No edema.     Left lower leg: No  edema.  Lymphadenopathy:     Cervical: No cervical adenopathy.  Skin:    General: Skin is warm and dry.     Findings: No rash.  Neurological:     Mental Status: She is alert and oriented to person, place, and time. Mental status is at baseline.  Psychiatric:        Mood and Affect: Mood normal.        Behavior: Behavior normal.       No results found for any visits on 06/14/22.  Assessment & Plan     1. Pre-conception counseling - discussed starting a penatal vitamin now -Planning to stop her oral contraceptive - Discussed unknown risks with Strattera and that this is not preferred ADHD treatment during pregnancy  2. Attention deficit hyperactivity disorder (ADHD), unspecified ADHD type -As above, discussed risks of Strattera during pregnancy - We discussed that some women do use Adderall or Vyvanse during pregnancy, but  risk is unknown so there are a lot of women who go without treatment further ADHD during pregnancy - She can continue Strattera until she is pregnant if she would like  3. Left ear pain -Recurrent issue - No evidence of otitis media or otitis externa on exam - On her recent MRI, she does have a small mastoid effusion and confluency of the maxillary sinuses - Continue Flonase and allergy treatment  No follow-ups on file.      I, Shirlee Latch, MD, have reviewed all documentation for this visit. The documentation on 06/14/22 for the exam, diagnosis, procedures, and orders are all accurate and complete.   Yiannis Tulloch, Marzella Schlein, MD, MPH [facility]

## 2022-06-15 ENCOUNTER — Other Ambulatory Visit: Payer: Self-pay | Admitting: Physician Assistant

## 2022-06-15 DIAGNOSIS — Z309 Encounter for contraceptive management, unspecified: Secondary | ICD-10-CM

## 2022-06-15 NOTE — Telephone Encounter (Signed)
Patient told me yesterday that she was going to stop birth control and try for pregnancy. Can we just double check before I refuse it?

## 2022-06-22 LAB — COMPREHENSIVE METABOLIC PANEL
ALT: 13 IU/L (ref 0–32)
AST: 11 IU/L (ref 0–40)
Albumin/Globulin Ratio: 1.8 (ref 1.2–2.2)
Albumin: 4.6 g/dL (ref 3.9–4.9)
Alkaline Phosphatase: 75 IU/L (ref 44–121)
BUN/Creatinine Ratio: 20 (ref 9–23)
BUN: 18 mg/dL (ref 6–20)
Bilirubin Total: 1.1 mg/dL (ref 0.0–1.2)
CO2: 23 mmol/L (ref 20–29)
Calcium: 9.4 mg/dL (ref 8.7–10.2)
Chloride: 106 mmol/L (ref 96–106)
Creatinine, Ser: 0.91 mg/dL (ref 0.57–1.00)
Globulin, Total: 2.5 g/dL (ref 1.5–4.5)
Glucose: 95 mg/dL (ref 70–99)
Potassium: 4.5 mmol/L (ref 3.5–5.2)
Sodium: 142 mmol/L (ref 134–144)
Total Protein: 7.1 g/dL (ref 6.0–8.5)
eGFR: 86 mL/min/{1.73_m2} (ref >59)

## 2022-06-22 LAB — CBC
Hematocrit: 38.1 % (ref 34.0–46.6)
Hemoglobin: 12.8 g/dL (ref 11.1–15.9)
MCH: 28.6 pg (ref 26.6–33.0)
MCHC: 33.6 g/dL (ref 31.5–35.7)
MCV: 85 fL (ref 79–97)
Platelets: 200 10*3/uL (ref 150–450)
RBC: 4.48 x10E6/uL (ref 3.77–5.28)
RDW: 12.6 % (ref 11.7–15.4)
WBC: 5.5 10*3/uL (ref 3.4–10.8)

## 2022-06-22 LAB — LIPID PANEL
Chol/HDL Ratio: 5.9 ratio — ABNORMAL HIGH (ref 0.0–4.4)
Cholesterol, Total: 196 mg/dL (ref 100–199)
HDL: 33 mg/dL — ABNORMAL LOW (ref >39)
LDL Chol Calc (NIH): 136 mg/dL — ABNORMAL HIGH (ref 0–99)
Triglycerides: 151 mg/dL — ABNORMAL HIGH (ref 0–149)
VLDL Cholesterol Cal: 27 mg/dL (ref 5–40)

## 2022-06-22 LAB — BRCASSURE COMPREHENSIVE PANEL

## 2022-06-22 LAB — TSH: TSH: 2.13 u[IU]/mL (ref 0.450–4.500)

## 2022-06-22 LAB — VITAMIN D 25 HYDROXY (VIT D DEFICIENCY, FRACTURES): Vit D, 25-Hydroxy: 28.8 ng/mL — ABNORMAL LOW (ref 30.0–100.0)

## 2022-08-13 ENCOUNTER — Other Ambulatory Visit: Payer: Self-pay | Admitting: Family Medicine

## 2022-08-13 DIAGNOSIS — O24419 Gestational diabetes mellitus in pregnancy, unspecified control: Secondary | ICD-10-CM

## 2022-08-13 HISTORY — DX: Gestational diabetes mellitus in pregnancy, unspecified control: O24.419

## 2022-08-23 ENCOUNTER — Ambulatory Visit: Payer: 59 | Admitting: Family Medicine

## 2022-11-23 ENCOUNTER — Telehealth: Payer: 59 | Admitting: Physician Assistant

## 2022-11-23 DIAGNOSIS — J069 Acute upper respiratory infection, unspecified: Secondary | ICD-10-CM

## 2022-11-23 DIAGNOSIS — B9689 Other specified bacterial agents as the cause of diseases classified elsewhere: Secondary | ICD-10-CM

## 2022-11-23 MED ORDER — AZITHROMYCIN 250 MG PO TABS: ORAL_TABLET | ORAL | 0 refills | Status: AC

## 2022-11-23 NOTE — Progress Notes (Signed)
E-Visit for Cough  We are sorry that you are not feeling well.  Here is how we plan to help!  Based on your presentation I believe you most likely have A cough due to bacteria.  When patients have a fever and a productive cough with a change in color or increased sputum production, we are concerned about bacterial bronchitis.  If left untreated it can progress to pneumonia.  If your symptoms do not improve with your treatment plan it is important that you contact your provider.   I have prescribed Azithromyin 250 mg: two tablets now and then one tablet daily for 4 additonal days    In addition you may use A non-prescription cough medication called Mucinex DM: take 2 tablets every 12 hours.   From your responses in the eVisit questionnaire you describe inflammation in the upper respiratory tract which is causing a significant cough.  This is commonly called Bronchitis and has four common causes:   Allergies Viral Infections Acid Reflux Bacterial Infection Allergies, viruses and acid reflux are treated by controlling symptoms or eliminating the cause. An example might be a cough caused by taking certain blood pressure medications. You stop the cough by changing the medication. Another example might be a cough caused by acid reflux. Controlling the reflux helps control the cough.  USE OF BRONCHODILATOR ("RESCUE") INHALERS: There is a risk from using your bronchodilator too frequently.  The risk is that over-reliance on a medication which only relaxes the muscles surrounding the breathing tubes can reduce the effectiveness of medications prescribed to reduce swelling and congestion of the tubes themselves.  Although you feel brief relief from the bronchodilator inhaler, your asthma may actually be worsening with the tubes becoming more swollen and filled with mucus.  This can delay other crucial treatments, such as oral steroid medications. If you need to use a bronchodilator inhaler daily, several  times per day, you should discuss this with your provider.  There are probably better treatments that could be used to keep your asthma under control.     HOME CARE Only take medications as instructed by your medical team. Complete the entire course of an antibiotic. Drink plenty of fluids and get plenty of rest. Avoid close contacts especially the very young and the elderly Cover your mouth if you cough or cough into your sleeve. Always remember to wash your hands A steam or ultrasonic humidifier can help congestion.   GET HELP RIGHT AWAY IF: You develop worsening fever. You become short of breath You cough up blood. Your symptoms persist after you have completed your treatment plan MAKE SURE YOU  Understand these instructions. Will watch your condition. Will get help right away if you are not doing well or get worse.    Thank you for choosing an e-visit.  Your e-visit answers were reviewed by a board certified advanced clinical practitioner to complete your personal care plan. Depending upon the condition, your plan could have included both over the counter or prescription medications.  Please review your pharmacy choice. Make sure the pharmacy is open so you can pick up prescription now. If there is a problem, you may contact your provider through [employer] and have the prescription routed to another pharmacy.  Your safety is important to [place]. If you have drug allergies check your prescription carefully.   For the next 24 hours you can use MyChart to ask questions about today's visit, request a non-urgent call back, or ask for a work or  school excuse. You will get an email in the next two days asking about your experience. I hope that your e-visit has been valuable and will speed your recovery.  I have spent 5 minutes in review of e-visit questionnaire, review and updating patient chart, medical decision making and response to patient.   Margaretann Loveless, PA-C

## 2023-04-01 ENCOUNTER — Other Ambulatory Visit: Payer: Self-pay | Admitting: Family Medicine

## 2023-04-16 ENCOUNTER — Encounter: Payer: 59 | Admitting: Family Medicine

## 2023-04-16 ENCOUNTER — Other Ambulatory Visit: Payer: Self-pay | Admitting: Family Medicine

## 2023-04-16 MED ORDER — MONTELUKAST SODIUM 10 MG PO TABS: 10.0000 mg | ORAL_TABLET | Freq: Every day | ORAL | 1 refills | Status: DC

## 2023-04-16 NOTE — Progress Notes (Deleted)
Complete physical exam  Patient: Melanie Valdez   DOB: 1990/11/28   32 y.o. Female  MRN: [id number]  Subjective:    No chief complaint on file.   Melanie Valdez is a 32 y.o. female who presents today for a complete physical exam. She reports consuming a {diet types:17450} diet. {types:19826} She generally feels {DESC; WELL/FAIRLY WELL/POORLY:18703}. She reports sleeping {DESC; WELL/FAIRLY WELL/POORLY:18703}. She {does/does not:200015} have additional problems to discuss today.    Most recent fall risk assessment:    05/21/2022    2:31 PM  Fall Risk   Falls in the past year? 0  Number falls in past yr: 0  Injury with Fall? 0  Risk for fall due to : No Fall Risks  Follow up Falls evaluation completed     Most recent depression screenings:    05/21/2022    2:32 PM 11/23/2021    9:36 AM  PHQ 2/9 Scores  PHQ - 2 Score 0 0  PHQ- 9 Score 0 0    {VISON DENTAL STD PSA (Optional):27386}  {History (Optional):23778}  Patient Care Team: Erasmo Downer, MD as PCP - General (Family Medicine) Debbe Odea, MD as PCP - Cardiology (Cardiology)   Outpatient Medications Prior to Visit  Medication Sig   albuterol (VENTOLIN HFA) 108 (90 Base) MCG/ACT inhaler Inhale 1-2 puffs into the lungs every 6 (six) hours as needed.   atomoxetine (STRATTERA) 40 MG capsule Take 1 capsule (40 mg total) by mouth 2 (two) times daily.   Cetirizine HCl (ZYRTEC ALLERGY) 10 MG CAPS Take 5 mg by mouth daily.    fexofenadine (ALLEGRA) 180 MG tablet Take 180 mg by mouth daily.   fluticasone (FLONASE) 50 MCG/ACT nasal spray Place 2 sprays into both nostrils daily.   Loperamide HCl (IMODIUM PO) Take by mouth as needed.   montelukast (SINGULAIR) 10 MG tablet TAKE 1 TABLET BY MOUTH AT BEDTIME. PLEASE SCHEDULE OFFICE VISIT BEFORE ANY FUTURE REFILL.   OMEPRAZOLE PO Take by mouth as needed.   No facility-administered medications prior to visit.    ROS        Objective:     There were no vitals  taken for this visit. {Vitals History (Optional):23777}  Physical Exam   No results found for any visits on 04/16/23. {Show previous labs (optional):23779}    Assessment & Plan:    Routine Health Maintenance and Physical Exam  Immunization History  Administered Date(s) Administered   DTaP 09/10/1989, 11/12/1989, 01/14/1990, 10/21/1990, 07/19/1993   HIB (PRP-OMP) 09/10/1989, 11/12/1989, 01/14/1990, 10/21/1990   Hepatitis B 08/10/1999, 09/11/1999, 01/19/2000   IPV 09/10/1989, 11/12/1989, 10/21/1990, 07/19/1993   Influenza,inj,Quad PF,6+ Mos 05/01/2017   Influenza-Unspecified 04/28/2018, 05/11/2020   MMR 10/21/1990, 07/19/1994   Meningococcal Conjugate 03/06/2004   PFIZER(Purple Top)SARS-COV-2 Vaccination 11/05/2019, 11/26/2019   PPD Test 03/16/2021   Pfizer Covid-19 Vaccine Bivalent Booster 84yrs & up 07/27/2021   Td 03/06/2004   Tdap 05/01/2017, 09/15/2018    Health Maintenance  Topic Date Due   INFLUENZA VACCINE  03/14/2023   COVID-19 Vaccine (4 - 2023-24 season) 04/14/2023   PAP SMEAR-Modifier  03/30/2024   DTaP/Tdap/Td (9 - Td or Tdap) 09/15/2028   Hepatitis C Screening  Completed   HIV Screening  Completed   HPV VACCINES  Aged Out    Discussed health benefits of physical activity, and encouraged her to engage in regular exercise appropriate for her age and condition.  Problem List Items Addressed This Visit   None  No follow-ups on file.  Shirlee Latch, MD

## 2023-05-22 ENCOUNTER — Telehealth: Payer: 59 | Admitting: Physician Assistant

## 2023-05-22 DIAGNOSIS — J019 Acute sinusitis, unspecified: Secondary | ICD-10-CM | POA: Diagnosis not present

## 2023-05-22 DIAGNOSIS — B9689 Other specified bacterial agents as the cause of diseases classified elsewhere: Secondary | ICD-10-CM

## 2023-05-22 MED ORDER — AMOXICILLIN 875 MG PO TABS
875.0000 mg | ORAL_TABLET | Freq: Two times a day (BID) | ORAL | 0 refills | Status: DC
Start: 2023-05-22 — End: 2023-07-01

## 2023-05-22 NOTE — Progress Notes (Signed)
I have spent 5 minutes in review of e-visit questionnaire, review and updating patient chart, medical decision making and response to patient.   Mia Milan Cody Jacklynn Dehaas, PA-C    

## 2023-05-22 NOTE — Progress Notes (Signed)
E-Visit for Sinus Problems  We are sorry that you are not feeling well.  Here is how we plan to help!  Based on what you have shared with me it looks like you have sinusitis.  Sinusitis is inflammation and infection in the sinus cavities of the head.  Based on your presentation I believe you most likely have Acute Bacterial Sinusitis.  This is an infection caused by bacteria and is treated with antibiotics. I have prescribed Amoxicillin 875 twice daily for 7 days.  You may use plain Mucinex or Mucinex-DM.  Saline nasal spray help and can safely be used as often as needed for congestion.  If you develop worsening sinus pain, fever or notice severe headache and vision changes, or if symptoms are not better after completion of antibiotic, please schedule an appointment with a health care provider.    Sinus infections are not as easily transmitted as other respiratory infection, however we still recommend that you avoid close contact with loved ones, especially the very young and elderly.  Remember to wash your hands thoroughly throughout the day as this is the number one way to prevent the spread of infection!  Home Care: Only take medications as instructed by your medical team. Complete the entire course of an antibiotic. Do not take these medications with alcohol. A steam or ultrasonic humidifier can help congestion.  You can place a towel over your head and breathe in the steam from hot water coming from a faucet. Avoid close contacts especially the very young and the elderly. Cover your mouth when you cough or sneeze. Always remember to wash your hands.  Get Help Right Away If: You develop worsening fever or sinus pain. You develop a severe head ache or visual changes. Your symptoms persist after you have completed your treatment plan.  Make sure you Understand these instructions. Will watch your condition. Will get help right away if you are not doing well or get worse.  Thank you for  choosing an e-visit.  Your e-visit answers were reviewed by a board certified advanced clinical practitioner to complete your personal care plan. Depending upon the condition, your plan could have included both over the counter or prescription medications.  Please review your pharmacy choice. Make sure the pharmacy is open so you can pick up prescription now. If there is a problem, you may contact your provider through [employer] and have the prescription routed to another pharmacy.  Your safety is important to [place]. If you have drug allergies check your prescription carefully.   For the next 24 hours you can use MyChart to ask questions about today's visit, request a non-urgent call back, or ask for a work or school excuse. You will get an email in the next two days asking about your experience. I hope that your e-visit has been valuable and will speed your recovery.

## 2023-05-28 ENCOUNTER — Ambulatory Visit: Payer: 59 | Admitting: Family Medicine

## 2023-05-28 ENCOUNTER — Other Ambulatory Visit: Payer: Self-pay | Admitting: Family Medicine

## 2023-05-28 VITALS — BP 114/84 | HR 81 | Temp 98.4°F | Ht 63.0 in | Wt 176.4 lb

## 2023-05-28 DIAGNOSIS — L301 Dyshidrosis [pompholyx]: Secondary | ICD-10-CM

## 2023-05-28 DIAGNOSIS — Z0001 Encounter for general adult medical examination with abnormal findings: Secondary | ICD-10-CM

## 2023-05-28 DIAGNOSIS — Z0000 Encounter for general adult medical examination without abnormal findings: Secondary | ICD-10-CM

## 2023-05-28 DIAGNOSIS — E782 Mixed hyperlipidemia: Secondary | ICD-10-CM | POA: Diagnosis not present

## 2023-05-28 DIAGNOSIS — E559 Vitamin D deficiency, unspecified: Secondary | ICD-10-CM

## 2023-05-28 DIAGNOSIS — F909 Attention-deficit hyperactivity disorder, unspecified type: Secondary | ICD-10-CM | POA: Diagnosis not present

## 2023-05-28 DIAGNOSIS — Z23 Encounter for immunization: Secondary | ICD-10-CM

## 2023-05-28 DIAGNOSIS — Z8632 Personal history of gestational diabetes: Secondary | ICD-10-CM | POA: Diagnosis not present

## 2023-05-28 MED ORDER — AMPHETAMINE-DEXTROAMPHET ER 10 MG PO CP24: 10.0000 mg | ORAL_CAPSULE | Freq: Every day | ORAL | 0 refills | Status: DC

## 2023-05-28 MED ORDER — CLOBETASOL PROPIONATE 0.05 % EX OINT: 1.0000 | TOPICAL_OINTMENT | Freq: Two times a day (BID) | CUTANEOUS | 2 refills | Status: DC

## 2023-05-28 NOTE — Assessment & Plan Note (Signed)
Patient reports feeling overwhelmed off medication. Previously on Strattera and Adderall XR. -Discontinue Strattera. -Start Adderall XR 10mg  daily. -Schedule follow-up in 3 months to assess response to medication.

## 2023-05-28 NOTE — Telephone Encounter (Signed)
90 day supply being requested.  Requested Prescriptions  Pending Prescriptions Disp Refills   montelukast (SINGULAIR) 10 MG tablet [Pharmacy Med Name: MONTELUKAST SOD 10 MG TABLET] 90 tablet 0    Sig: TAKE 1 TABLET BY MOUTH EVERYDAY AT BEDTIME     Pulmonology:  Leukotriene Inhibitors Passed - 05/28/2023  8:33 AM      Passed - Valid encounter within last 12 months    Recent Outpatient Visits           Today Encounter for annual physical exam   [facility] [place], Marzella Schlein, MD   11 months ago Pre-conception counseling   [facility] [place], Marzella Schlein, MD   1 year ago Encounter for annual physical exam   [facility] [place], Marzella Schlein, MD   1 year ago Mastalgia   [place] [facility] [place], Marzella Schlein, MD   1 year ago Acute non-recurrent pansinusitis   [facility] Jacky Kindle, FNP       Future Appointments             In 3 months [place], Marzella Schlein, MD [facility]

## 2023-05-28 NOTE — Progress Notes (Signed)
Complete physical exam  Patient: Melanie Valdez   DOB: [birth year]   32 y.o. Female  MRN: [id number]  Subjective:    Chief Complaint  Patient presents with   Annual Exam    Annual visit    Melanie Valdez is a 32 y.o. female who presents today for a complete physical exam. She reports consuming a general diet.  She generally feels well. She reports sleeping fairly well. She does have additional problems to discuss today.    Discussed the use of AI scribe software for clinical note transcription with the patient, who gave verbal consent to proceed.  History of Present Illness   The patient, with a history of gestational diabetes and ADHD, presents for a wellness check-up. She recently gave birth and has been experiencing several health concerns.  The patient was diagnosed with gestational diabetes during her pregnancy and was prescribed metformin. However, she was not informed to stop taking the medication after giving birth and continued its use for six weeks postpartum. She has since stopped taking metformin and is due for an A1c check in twelve weeks.  The patient also has a history of ADHD for which she was previously taking Adderall. She stopped the medication during her pregnancy and has been feeling overwhelmed since. She tried Strattera but did not find it effective. She is seeking to restart ADHD medication, preferably Adderall.  Additionally, the patient has developed a skin condition on her hands. She has small bumps and raw areas that are itchy and painful. She has tried various creams and treatments without success.  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 She has numbness in her calf and heel and is scheduled for a nerve conduction study. She is currently not on any medication for this condition.       Most recent fall risk assessment:    05/28/2023   10:24 AM  Fall Risk   Falls in the past year? 0  Number falls in past yr: 0  Injury with  Fall? 0  Risk for fall due to : No Fall Risks  Follow up Falls evaluation completed     Most recent depression screenings:    05/28/2023   10:25 AM 05/21/2022    2:32 PM  PHQ 2/9 Scores  PHQ - 2 Score 0 0  PHQ- 9 Score  0        Patient Care Team: Erasmo Downer, MD as PCP - General (Family Medicine) Debbe Odea, MD as PCP - Cardiology (Cardiology)   Outpatient Medications Prior to Visit  Medication Sig   albuterol (VENTOLIN HFA) 108 (90 Base) MCG/ACT inhaler Inhale 1-2 puffs into the lungs every 6 (six) hours as needed.   amoxicillin (AMOXIL) 875 MG tablet Take 1 tablet (875 mg total) by mouth 2 (two) times daily.   Cetirizine HCl (ZYRTEC ALLERGY) 10 MG CAPS Take 5 mg by mouth daily.    fexofenadine (ALLEGRA) 180 MG tablet Take 180 mg by mouth daily.   fluticasone (FLONASE) 50 MCG/ACT nasal spray Place 2 sprays into both nostrils daily.   Loperamide HCl (IMODIUM PO) Take by mouth as needed.   montelukast (SINGULAIR) 10 MG tablet Take 1 tablet (10 mg total) by mouth at bedtime.   [DISCONTINUED] atomoxetine (STRATTERA) 40 MG capsule Take 1 capsule (40 mg total) by mouth 2 (two) times daily. (Patient not taking: Reported on 05/28/2023)   No facility-administered medications prior to visit.    ROS  Objective:     BP 114/84   Pulse 81   Temp 98.4 F (36.9 C) (Oral)   Ht 5\' 3"  (1.6 m)   Wt 176 lb 6.4 oz (80 kg)   SpO2 96%   BMI 31.25 kg/m    Physical Exam Vitals reviewed.  Constitutional:      General: She is not in acute distress.    Appearance: Normal appearance. She is well-developed. She is not diaphoretic.  HENT:     Head: Normocephalic and atraumatic.     Right Ear: Tympanic membrane, ear canal and external ear normal.     Left Ear: Tympanic membrane, ear canal and external ear normal.     Nose: Nose normal.     Mouth/Throat:     Mouth: Mucous membranes are moist.     Pharynx: Oropharynx is clear. No oropharyngeal exudate.   Eyes:     General: No scleral icterus.    Conjunctiva/sclera: Conjunctivae normal.     Pupils: Pupils are equal, round, and reactive to light.  Neck:     Thyroid: No thyromegaly.  Cardiovascular:     Rate and Rhythm: Normal rate and regular rhythm.     Heart sounds: Normal heart sounds. No murmur heard. Pulmonary:     Effort: Pulmonary effort is normal. No respiratory distress.     Breath sounds: Normal breath sounds. No wheezing or rales.  Abdominal:     General: There is no distension.     Palpations: Abdomen is soft.     Tenderness: There is no abdominal tenderness.  Musculoskeletal:        General: No deformity.     Cervical back: Neck supple.     Right lower leg: No edema.     Left lower leg: No edema.  Lymphadenopathy:     Cervical: No cervical adenopathy.  Skin:    General: Skin is warm and dry.     Findings: No rash.  Neurological:     Mental Status: She is alert and oriented to person, place, and time. Mental status is at baseline.     Gait: Gait normal.  Psychiatric:        Mood and Affect: Mood normal.        Behavior: Behavior normal.        Thought Content: Thought content normal.      No results found for any visits on 05/28/23.     Assessment & Plan:    Routine Health Maintenance and Physical Exam  Immunization History  Administered Date(s) Administered   DTaP 09/10/1989, 11/12/1989, 01/14/1990, 10/21/1990, 07/19/1993   HIB (PRP-OMP) 09/10/1989, 11/12/1989, 01/14/1990, 10/21/1990   Hepatitis B 08/10/1999, 09/11/1999, 01/19/2000   IPV 09/10/1989, 11/12/1989, 10/21/1990, 07/19/1993   Influenza, Seasonal, Injecte, Preservative Fre 05/28/2023   Influenza,inj,Quad PF,6+ Mos 05/01/2017   Influenza-Unspecified 04/28/2018, 05/11/2020   MMR 10/21/1990, 07/19/1994   Meningococcal Conjugate 03/06/2004   PFIZER(Purple Top)SARS-COV-2 Vaccination 11/05/2019, 11/26/2019   PPD Test 03/16/2021   Pfizer Covid-19 Vaccine Bivalent Booster 73yrs & up 07/27/2021    Td 03/06/2004   Tdap 05/01/2017, 09/15/2018    Health Maintenance  Topic Date Due   COVID-19 Vaccine (4 - 2023-24 season) 04/14/2023   Cervical Cancer Screening (HPV/Pap Cotest)  03/30/2026   DTaP/Tdap/Td (9 - Td or Tdap) 09/15/2028   INFLUENZA VACCINE  Completed   Hepatitis C Screening  Completed   HIV Screening  Completed   HPV VACCINES  Aged Out    Discussed health benefits of physical activity, and  encouraged her to engage in regular exercise appropriate for her age and condition.  Problem List Items Addressed This Visit       Other   ADHD    Patient reports feeling overwhelmed off medication. Previously on Strattera and Adderall XR. -Discontinue Strattera. -Start Adderall XR 10mg  daily. -Schedule follow-up in 3 months to assess response to medication.      Avitaminosis D   Relevant Orders   VITAMIN D 25 Hydroxy (Vit-D Deficiency, Fractures)   Moderate mixed hyperlipidemia not requiring statin therapy   Relevant Orders   Comprehensive metabolic panel   Lipid panel   Other Visit Diagnoses     Encounter for annual physical exam    -  Primary   Relevant Orders   TSH   CBC with Differential/Platelet   Comprehensive metabolic panel   Lipid panel   VITAMIN D 25 Hydroxy (Vit-D Deficiency, Fractures)   Immunization due       Relevant Orders   Flu vaccine trivalent PF, 6mos and older(Flulaval,Afluria,Fluarix,Fluzone) (Completed)   History of gestational diabetes       Relevant Orders   Hemoglobin A1c   Dyshidrotic eczema               Gestational Diabetes Patient was on Metformin during pregnancy and continued for 6 weeks postpartum. Currently off Metformin for 3 weeks. -Order A1C in 9 weeks to assess glucose control postpartum.  Dyshidrotic Eczema Patient presents with small blisters and raw areas on hands. No significant exposure to water or chemicals identified. -Prescribe strong topical steroid cream. -Advise patient to apply cream and cover with  occlusive dressing overnight.  General Health Maintenance -Administer influenza vaccine today. -Remind patient of due date for next Pap smear. -Advise patient on availability of COVID-19 booster shot. -Order routine labs including Vitamin D, thyroid, blood counts, kidney and liver function, cholesterol, and thyroid. -Schedule follow-up in 3 months for ADHD medication assessment.        Return in about 3 months (around 08/28/2023) for ADD f/u, virtual ok.     Shirlee Latch, MD

## 2023-05-29 LAB — CBC WITH DIFFERENTIAL/PLATELET
Basophils Absolute: 0.1 10*3/uL (ref 0.0–0.2)
Basos: 1 %
EOS (ABSOLUTE): 0.2 10*3/uL (ref 0.0–0.4)
Eos: 4 %
Hematocrit: 40 % (ref 34.0–46.6)
Hemoglobin: 13.4 g/dL (ref 11.1–15.9)
Immature Grans (Abs): 0 10*3/uL (ref 0.0–0.1)
Immature Granulocytes: 0 %
Lymphocytes Absolute: 2 10*3/uL (ref 0.7–3.1)
Lymphs: 33 %
MCH: 28.6 pg (ref 26.6–33.0)
MCHC: 33.5 g/dL (ref 31.5–35.7)
MCV: 85 fL (ref 79–97)
Monocytes Absolute: 0.4 10*3/uL (ref 0.1–0.9)
Monocytes: 7 %
Neutrophils Absolute: 3.3 10*3/uL (ref 1.4–7.0)
Neutrophils: 55 %
Platelets: 204 10*3/uL (ref 150–450)
RBC: 4.69 x10E6/uL (ref 3.77–5.28)
RDW: 14.8 % (ref 11.7–15.4)
WBC: 6 10*3/uL (ref 3.4–10.8)

## 2023-05-29 LAB — TSH: TSH: 7.5 u[IU]/mL — ABNORMAL HIGH (ref 0.450–4.500)

## 2023-05-29 LAB — COMPREHENSIVE METABOLIC PANEL
ALT: 74 [IU]/L — ABNORMAL HIGH (ref 0–32)
AST: 39 [IU]/L (ref 0–40)
Albumin: 4.7 g/dL (ref 3.9–4.9)
Alkaline Phosphatase: 122 [IU]/L — ABNORMAL HIGH (ref 44–121)
BUN/Creatinine Ratio: 20 (ref 9–23)
BUN: 19 mg/dL (ref 6–20)
Bilirubin Total: 0.8 mg/dL (ref 0.0–1.2)
CO2: 24 mmol/L (ref 20–29)
Calcium: 9.9 mg/dL (ref 8.7–10.2)
Chloride: 102 mmol/L (ref 96–106)
Creatinine, Ser: 0.97 mg/dL (ref 0.57–1.00)
Globulin, Total: 2.7 g/dL (ref 1.5–4.5)
Glucose: 92 mg/dL (ref 70–99)
Potassium: 4.7 mmol/L (ref 3.5–5.2)
Sodium: 142 mmol/L (ref 134–144)
Total Protein: 7.4 g/dL (ref 6.0–8.5)
eGFR: 79 mL/min/{1.73_m2} (ref >59)

## 2023-05-29 LAB — LIPID PANEL
Chol/HDL Ratio: 6.4 {ratio} — ABNORMAL HIGH (ref 0.0–4.4)
Cholesterol, Total: 267 mg/dL — ABNORMAL HIGH (ref 100–199)
HDL: 42 mg/dL (ref >39)
LDL Chol Calc (NIH): 168 mg/dL — ABNORMAL HIGH (ref 0–99)
Triglycerides: 300 mg/dL — ABNORMAL HIGH (ref 0–149)
VLDL Cholesterol Cal: 57 mg/dL — ABNORMAL HIGH (ref 5–40)

## 2023-05-29 LAB — VITAMIN D 25 HYDROXY (VIT D DEFICIENCY, FRACTURES): Vit D, 25-Hydroxy: 39.1 ng/mL (ref 30.0–100.0)

## 2023-05-30 ENCOUNTER — Encounter: Payer: Self-pay | Admitting: Family Medicine

## 2023-05-30 DIAGNOSIS — R7989 Other specified abnormal findings of blood chemistry: Secondary | ICD-10-CM

## 2023-06-03 ENCOUNTER — Telehealth: Payer: Self-pay

## 2023-06-06 NOTE — Telephone Encounter (Signed)
Error

## 2023-06-22 IMAGING — MG DIGITAL DIAGNOSTIC BILAT W/ TOMO W/ CAD
6 of 10 series · 6 of 30 positions shown · non-contrast
Comparison: None

CLINICAL DATA: Lump and tenderness left axilla. Patient has a
history of prior bilateral reduction breast surgery.

EXAM:
DIGITAL DIAGNOSTIC BILATERAL MAMMOGRAM WITH TOMOSYNTHESIS AND CAD;
ULTRASOUND LEFT BREAST LIMITED
TECHNIQUE: Bilateral digital diagnostic mammography and breast tomosynthesis
was performed. The images were evaluated with computer-aided
detection.; Targeted ultrasound examination of the left breast was
performed.

[L TAN synth-2D]
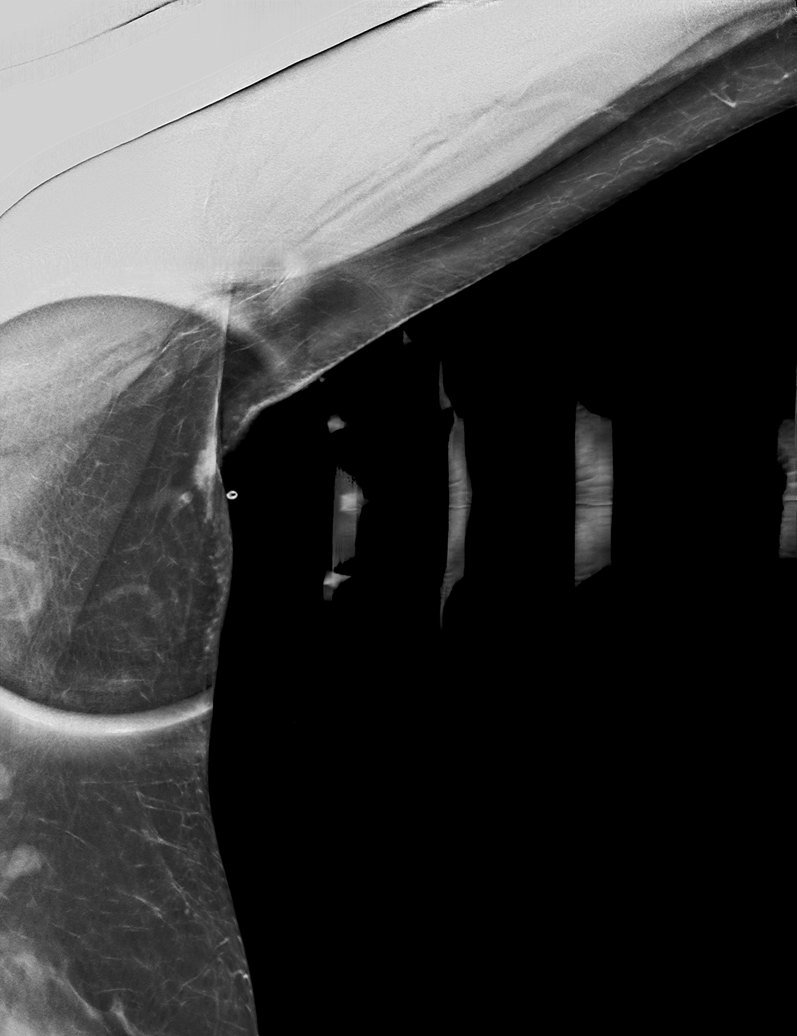

[R MLO synth-2D]
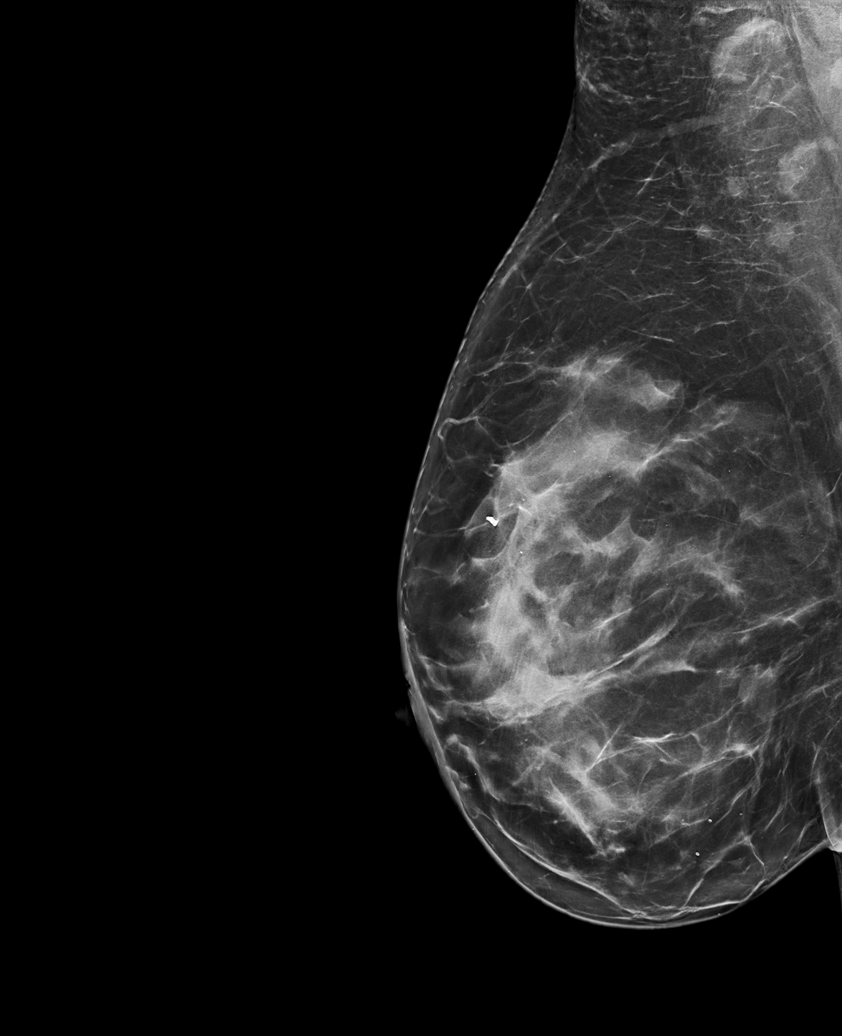

[L CC synth-2D]
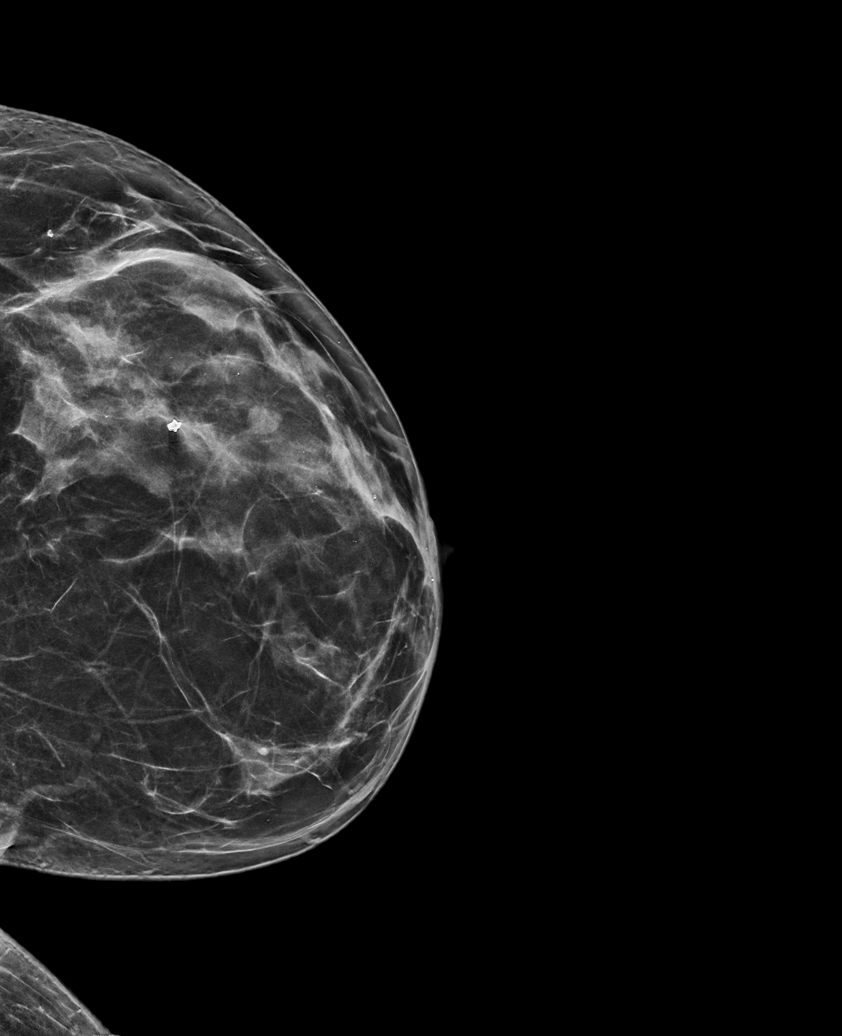

[L MLO synth-2D]
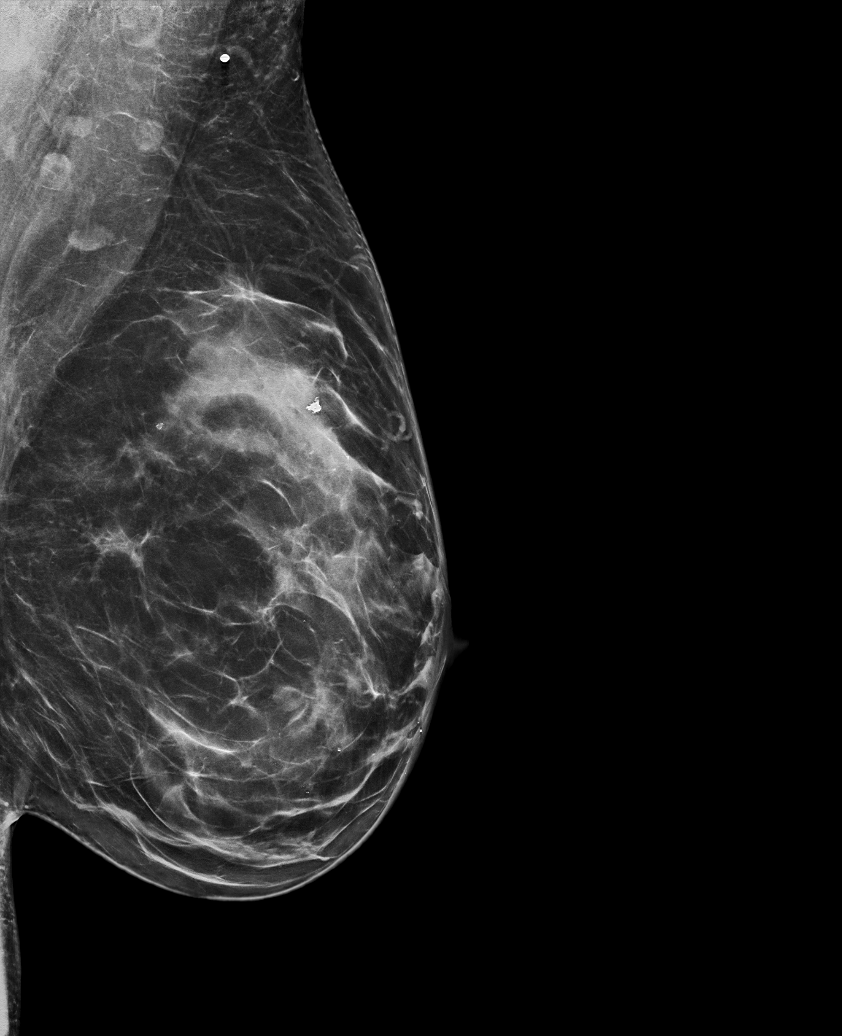

[R CC synth-2D]
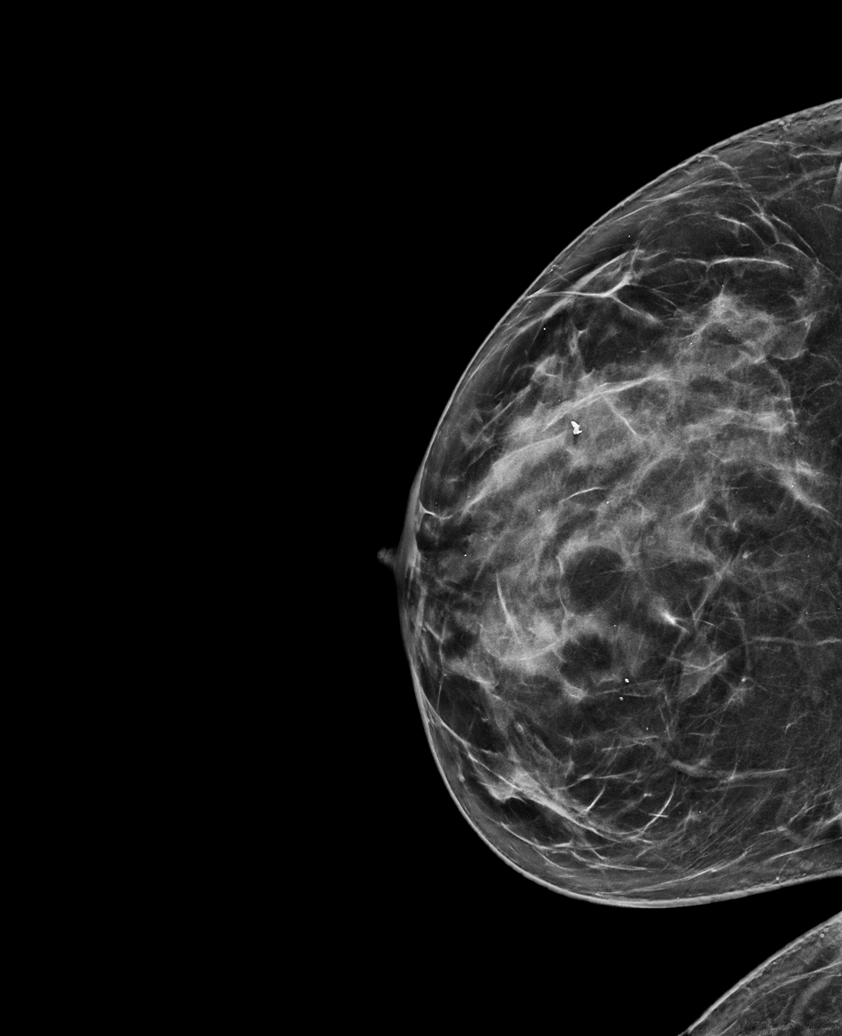

[L MLO tomo · tomo slice 45/90.0]
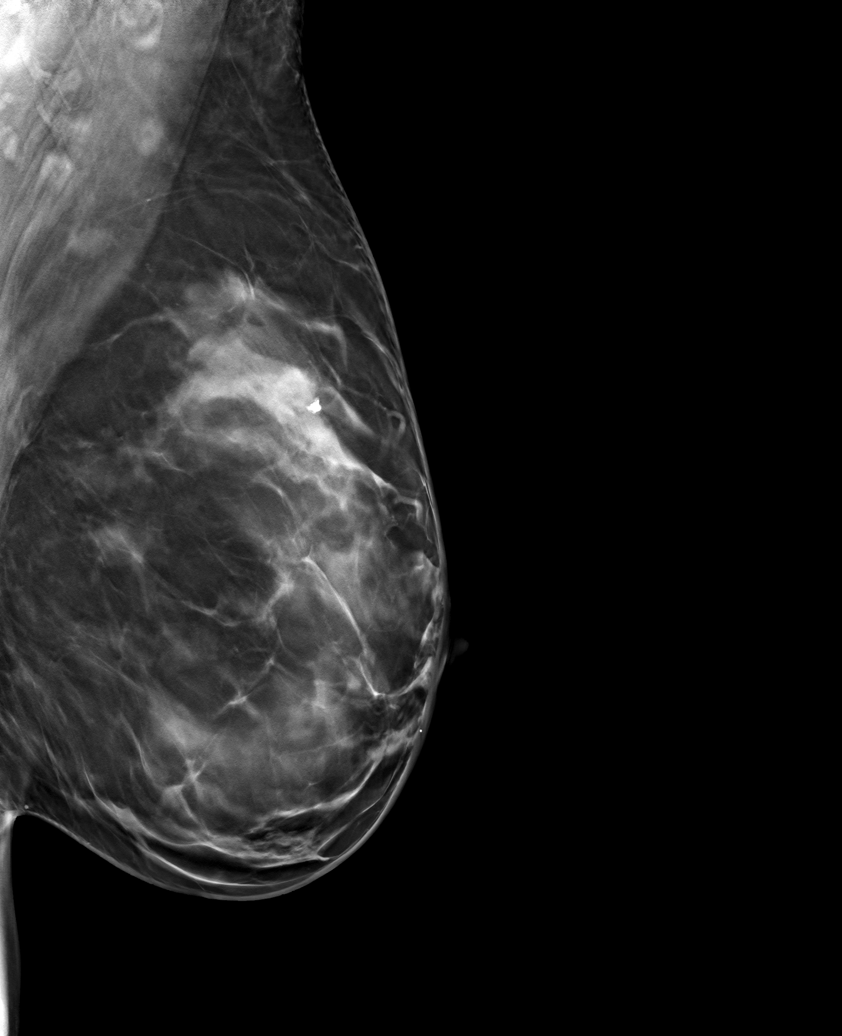

[6 of 30 positions shown; findings below may reference images not displayed]

ACR Breast Density Category b: There are scattered areas of
fibroglandular density.
FINDINGS: Cc and MLO views of bilateral breasts are submitted. The right
breast is negative. There is a mass in the lateral lower superficial
left breast.

Targeted ultrasound is performed, showing no focal abnormal discrete
cystic or solid lesion at palpable area/pain left axilla. At the
left breast 4 o'clock 5 cm from nipple, there is a minimal
complicated cyst without color flow in the subdermal region
measuring 6 x 3 x 6 mm correlating to the mammographic mass.
IMPRESSION: Benign findings.

RECOMMENDATION:
Routine screening mammogram at age 38.

I have discussed the findings and recommendations with the patient.
If applicable, a reminder letter will be sent to the patient
regarding the next appointment.

BI-RADS CATEGORY  2: Benign.

## 2023-06-26 ENCOUNTER — Inpatient Hospital Stay
Admission: RE | Admit: 2023-06-26 | Discharge: 2023-06-26 | Disposition: A | Discharge status: Home / Self Care | Payer: Self-pay | Source: Ambulatory Visit | Attending: Neurosurgery | Admitting: Neurosurgery

## 2023-06-26 ENCOUNTER — Other Ambulatory Visit: Payer: Self-pay | Admitting: Family Medicine

## 2023-06-26 DIAGNOSIS — Z049 Encounter for examination and observation for unspecified reason: Secondary | ICD-10-CM

## 2023-06-27 NOTE — Progress Notes (Signed)
Referring Physician:  Evon Slack, PA-C [address] [place],  [place] [address]  Primary Physician:  Erasmo Downer, MD  History of Present Illness: 07/01/2023 Melanie Valdez is here today with a chief complaint of \\left  lower extremity weakness since recent delivery.  She had an epidural during this delivery and was then stirrup/dorsal lithotomy for an extended period, she states at least 3 hours.  Once the epidural wore off she noticed that her left lower extremity was incredibly weak and numb.  She had this for some time but thankfully had improvement in her motor function.  Initially she had complete loss of motor function in her left lower extremity.  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  This was reviewed.  She does have sensory loss including lower extremity distal to the knee, posterior thigh.  She notes that if she sits for prolonged period with her legs crossed she will get loss of sensation as well.  A used to bother her when she would sit on hard surfaces but that has improved.  She continues to work with physical therapy.  Review of Systems:  A 10 point review of systems is negative, except for the pertinent positives and negatives detailed in the HPI.  Past Medical History: Past Medical History:  Diagnosis Date   Asthma    Complication of anesthesia    PVC's (premature ventricular contractions)     Past Surgical History: Past Surgical History:  Procedure Laterality Date   BREAST REDUCTION SURGERY     BREAST SURGERY     CESAREAN SECTION  2018   COLONOSCOPY WITH PROPOFOL N/A 06/16/2019   Procedure: COLONOSCOPY WITH PROPOFOL;  Surgeon: Pasty Spillers, MD;  Location: [facility] ENDOSCOPY;  Service: Endoscopy;  Laterality: N/A;   LEEP  2015   REDUCTION MAMMAPLASTY     TONSILLECTOMY     TONSILLECTOMY AND ADENOIDECTOMY  1999   WISDOM TOOTH EXTRACTION  2009     Allergies: Allergies as of 07/01/2023 - Review Complete 07/01/2023  Allergen Reaction Noted   Sulfa antibiotics Anaphylaxis and Itching 07/23/2011   Ciprofloxacin  06/08/2009   Codeine Nausea And Vomiting 07/23/2011   Hydrocodone-acetaminophen Itching 06/08/2009    Medications:  Current Outpatient Medications:    albuterol (VENTOLIN HFA) 108 (90 Base) MCG/ACT inhaler, Inhale 1-2 puffs into the lungs every 6 (six) hours as needed., Disp: 1 each, Rfl: 1   amphetamine-dextroamphetamine (ADDERALL XR) 10 MG 24 hr capsule, Take 1 capsule (10 mg total) by mouth daily., Disp: 30 capsule, Rfl: 0   amphetamine-dextroamphetamine (ADDERALL XR) 10 MG 24 hr capsule, Take 1 capsule (10 mg total) by mouth daily., Disp: 30 capsule, Rfl: 0   amphetamine-dextroamphetamine (ADDERALL XR) 10 MG 24 hr capsule, Take 1 capsule (10 mg total) by mouth daily., Disp: 30 capsule, Rfl: 0   Cetirizine HCl (ZYRTEC ALLERGY) 10 MG CAPS, Take 5 mg by mouth daily. , Disp: , Rfl:    clobetasol ointment (TEMOVATE) 0.05 %, Apply 1 Application topically 2 (two) times daily., Disp: 60 g, Rfl: 2   Ferrous Sulfate (IRON PO), Take 1 tablet by mouth daily., Disp: , Rfl:    fexofenadine (ALLEGRA) 180 MG tablet, Take 180 mg by mouth daily., Disp: , Rfl:    fluticasone (FLONASE) 50 MCG/ACT nasal spray, Place 2 sprays into both nostrils daily., Disp: , Rfl:    gabapentin (NEURONTIN) 300 MG capsule, Take 300 mg by mouth  3 (three) times daily., Disp: , Rfl:    Loperamide HCl (IMODIUM PO), Take by mouth as needed., Disp: , Rfl:    montelukast (SINGULAIR) 10 MG tablet, TAKE 1 TABLET BY MOUTH EVERYDAY AT BEDTIME, Disp: 90 tablet, Rfl: 0   norethindrone (MICRONOR) 0.35 MG tablet, Take 1 tablet by mouth daily., Disp: , Rfl:    triamcinolone (KENALOG) 0.025 % cream, Apply 1 Application topically., Disp: , Rfl:   Social History: Social History   Tobacco Use   Smoking status: Never   Smokeless tobacco: Never  Vaping Use   Vaping  status: Never Used  Substance Use Topics   Alcohol use: Yes    Alcohol/week: 1.0 standard drink of alcohol    Types: 1 Glasses of wine per week   Drug use: Never    Family Medical History: Family History  Problem Relation Age of Onset   Hypertension Maternal Grandmother    Kidney disease Maternal Grandmother    Breast cancer Maternal Grandmother    Heart disease Maternal Grandfather    Breast cancer Other        MGGGM   Breast cancer Other        Mgreataunt   Breast cancer Other        m 2nd cousin    Physical Examination: Vitals:   07/01/23 0958  BP: 128/86    General: Patient is in no apparent distress. Attention to examination is appropriate.  Neck:   Supple.  Full range of motion.  Respiratory: Patient is breathing without any difficulty.   NEUROLOGICAL:     Awake, alert, oriented to person, place, and time.  Speech is clear and fluent.   Cranial Nerves: Pupils equal round and reactive to light.  Facial tone is symmetric. Shoulder shrug is symmetric. Tongue protrusion is midline.  There is no pronator drift.  Motor Exam:  On physical examination she does appear to have some slight decrease in bulk in her hamstrings musculature, otherwise no major motor/muscle losses.  On volitional examination she does show incomplete range of motion in dorsiflexion, however her toe extension and EHL appear to be mostly intact.  She does have some weakness or with plantarflexion, her eversion is more impacted than her inversion.  She also has hamstrings and quadriceps weakness noted.  On the right side her hamstrings, patellar, and Achilles reflexes are intact.  On the left she shows a decreased but present left femoral reflex, she has a loss of both her medial and lateral hamstrings reflex, light has a loss of Achilles reflex,  Decree sensation in the posterior thigh posterior calf, and heel.  Some decrease in the peroneal distribution as well.   Medical Decision  Making  Imaging: I reviewed her lumbar imaging which was an MRI on August 06/2023, no evidence of severe stenosis that would be causing left-sided lower extremity weakness in these distributions.  Given the extent of involvement would need to be a multilevel polyradiculopathy for which there are no physical findings on her MRI to explain this.  No evidence of epidural hematoma.  Electrodiagnostics: Review of her EMG from 06/21/2023 demonstrates a recovering left-sided femoral neuropathy with a coinciding left-sided sciatic/tibial lesion.  She had some nerve muscle changes in her sciatic nerve distribution as well as hamstrings, tibial.  She also showed decreased sural responses as well.  This was thought to be a sciatic versus tibial predominant change  I have personally reviewed the images and electrodiagnostics and agree with the above interpretation.  Assessment  and Plan: Melanie Valdez is a pleasant 32 y.o. female with history of left lower extremity weakness and numbness after a birth of a recent child.  She was in the dorsolithotomy position for approximately 3+ hours.  She had an epidural anesthesia placed and was able to notice a decrease in strength and sensation in her left lower extremity after the medicine had worn off.  She states that her left lower extremity was initially flaccid but thankfully rapidly regained function over the course of a month.  She does not feel like she is having considerable improvement since that time but does state that her physical therapy team is encouraging that she is improving.  On physical examination she has slight weakness in knee extension, slight weakness in hamstrings flexion, she is at least 4 out of 5 in toe extension including the EHL distribution.  Notably when trying forced dorsiflexion she has minimal activation of her tibialis anterior noted but does have activation of her extrinsic toe extensors as palpated at the ankle.  At this point she is near  antigravity at least in all the tested muscle groups, for the femoral nerve there would be no intervention other than time and pain management if that becomes an issue.  On the sciatic side she does have significant weakness in dorsiflexion with limited range of motion and developing contracture.  Given her developing contracture we asked that she be fitted for a custom AFO as her previously provided AFOs given her limited support and does not fit in her shoes.  We also like to continue to follow her over the next 3 months to note for any improvement in her motor function.  There are some patients with high sciatic nerve injuries that continue to lag in improvement in the peroneal distribution, if this is found we would like to obtain a ultrasound of her peroneal nerve at the fibular neck to evaluate for any secondary entrapment or compression.   . The symptoms are causing a significant impact on the patient's life. I have utilized the care everywhere function in epic to review the outside records available from external health systems, and have personally reviewed relevant imaging and electrodiagnostic workup.    Thank you for involving me in the care of this patient.    Lovenia Kim MD/MSCR Neurosurgery - Peripheral Nerve Surgery

## 2023-06-28 LAB — T4, FREE: Free T4: 1.04 ng/dL (ref 0.82–1.77)

## 2023-06-28 LAB — SPECIMEN STATUS REPORT

## 2023-07-01 ENCOUNTER — Encounter: Payer: Self-pay | Admitting: Neurosurgery

## 2023-07-01 ENCOUNTER — Ambulatory Visit (INDEPENDENT_AMBULATORY_CARE_PROVIDER_SITE_OTHER): Payer: 59 | Admitting: Neurosurgery

## 2023-07-01 VITALS — BP 128/86 | Ht 62.0 in | Wt 172.0 lb

## 2023-07-01 DIAGNOSIS — R29898 Other symptoms and signs involving the musculoskeletal system: Secondary | ICD-10-CM | POA: Diagnosis not present

## 2023-07-01 DIAGNOSIS — G5702 Lesion of sciatic nerve, left lower limb: Secondary | ICD-10-CM | POA: Diagnosis not present

## 2023-07-01 DIAGNOSIS — G5722 Lesion of femoral nerve, left lower limb: Secondary | ICD-10-CM | POA: Diagnosis not present

## 2023-07-04 ENCOUNTER — Telehealth: Payer: 59 | Admitting: Emergency Medicine

## 2023-07-04 DIAGNOSIS — J019 Acute sinusitis, unspecified: Secondary | ICD-10-CM | POA: Diagnosis not present

## 2023-07-04 MED ORDER — AMOXICILLIN-POT CLAVULANATE 875-125 MG PO TABS: 1.0000 | ORAL_TABLET | Freq: Two times a day (BID) | ORAL | 0 refills | Status: DC

## 2023-07-04 MED ORDER — BENZONATATE 100 MG PO CAPS
100.0000 mg | ORAL_CAPSULE | Freq: Two times a day (BID) | ORAL | 0 refills | Status: DC | PRN
Start: 2023-07-04 — End: 2023-07-18

## 2023-07-04 NOTE — Progress Notes (Signed)
E-Visit for Sinus Problems  We are sorry that you are not feeling well.  Here is how we plan to help!  Based on what you have shared with me it looks like you have sinusitis.  Sinusitis is inflammation and infection in the sinus cavities of the head.  Based on your presentation I believe you most likely have Acute Bacterial Sinusitis.  This is an infection caused by bacteria and is treated with antibiotics. I have prescribed Augmentin 875mg /125mg  one tablet twice daily with food, for 7 days. You may use an oral decongestant such as Mucinex D or if you have glaucoma or high blood pressure use plain Mucinex. Saline nasal spray help and can safely be used as often as needed for congestion.  If you develop worsening sinus pain, fever or notice severe headache and vision changes, or if symptoms are not better after completion of antibiotic, please schedule an appointment with a health care provider.    I've sent an RX for some cough medicine as well.   Sinus infections are not as easily transmitted as other respiratory infection, however we still recommend that you avoid close contact with loved ones, especially the very young and elderly.  Remember to wash your hands thoroughly throughout the day as this is the number one way to prevent the spread of infection!  Home Care: Only take medications as instructed by your medical team. Complete the entire course of an antibiotic. Do not take these medications with alcohol. A steam or ultrasonic humidifier can help congestion.  You can place a towel over your head and breathe in the steam from hot water coming from a faucet. Avoid close contacts especially the very young and the elderly. Cover your mouth when you cough or sneeze. Always remember to wash your hands.  Get Help Right Away If: You develop worsening fever or sinus pain. You develop a severe head ache or visual changes. Your symptoms persist after you have completed your treatment plan.  Make  sure you Understand these instructions. Will watch your condition. Will get help right away if you are not doing well or get worse.  Thank you for choosing an e-visit.  Your e-visit answers were reviewed by a board certified advanced clinical practitioner to complete your personal care plan. Depending upon the condition, your plan could have included both over the counter or prescription medications.  Please review your pharmacy choice. Make sure the pharmacy is open so you can pick up prescription now. If there is a problem, you may contact your provider through [employer] and have the prescription routed to another pharmacy.  Your safety is important to [place]. If you have drug allergies check your prescription carefully.   For the next 24 hours you can use MyChart to ask questions about today's visit, request a non-urgent call back, or ask for a work or school excuse. You will get an email in the next two days asking about your experience. I hope that your e-visit has been valuable and will speed your recovery.  Approximately 5 minutes was used in reviewing the patient's chart, questionnaire, prescribing medications, and documentation.

## 2023-07-16 ENCOUNTER — Encounter: Payer: Self-pay | Admitting: Neurosurgery

## 2023-07-16 NOTE — Telephone Encounter (Signed)
Per Dr Michaelle Copas office note on 07/01/23, I have sent an order to [facility] for a custom AFO.

## 2023-07-18 ENCOUNTER — Ambulatory Visit: Payer: 59 | Admitting: Family Medicine

## 2023-07-18 ENCOUNTER — Ambulatory Visit: Payer: Self-pay | Admitting: Surgery

## 2023-07-18 ENCOUNTER — Encounter: Payer: Self-pay | Admitting: Family Medicine

## 2023-07-18 ENCOUNTER — Telehealth: Payer: Self-pay | Admitting: Surgery

## 2023-07-18 ENCOUNTER — Encounter: Payer: Self-pay | Admitting: Surgery

## 2023-07-18 ENCOUNTER — Ambulatory Visit: Payer: 59 | Admitting: Surgery

## 2023-07-18 VITALS — BP 122/83 | HR 87 | Temp 98.5°F | Wt 169.0 lb

## 2023-07-18 VITALS — BP 132/83 | HR 88 | Temp 98.0°F | Ht 62.0 in | Wt 168.0 lb

## 2023-07-18 DIAGNOSIS — K611 Rectal abscess: Secondary | ICD-10-CM | POA: Insufficient documentation

## 2023-07-18 DIAGNOSIS — N9089 Other specified noninflammatory disorders of vulva and perineum: Secondary | ICD-10-CM | POA: Diagnosis not present

## 2023-07-18 DIAGNOSIS — N360 Urethral fistula: Secondary | ICD-10-CM

## 2023-07-18 MED ORDER — CIPROFLOXACIN HCL 500 MG PO TABS
500.0000 mg | ORAL_TABLET | Freq: Two times a day (BID) | ORAL | 0 refills | Status: AC
Start: 2023-07-18 — End: 2023-07-23

## 2023-07-18 MED ORDER — CHLORHEXIDINE GLUCONATE CLOTH 2 % EX PADS
6.0000 | MEDICATED_PAD | Freq: Once | CUTANEOUS | Status: DC
Start: 2023-07-18 — End: 2023-07-19

## 2023-07-18 MED ORDER — METRONIDAZOLE 500 MG/100ML IV SOLN
500.0000 mg | INTRAVENOUS | Status: AC
  Administered 2023-07-19: 500 mg via INTRAVENOUS
  Filled 2023-07-18: qty 100

## 2023-07-18 MED ORDER — FLEET ENEMA RE ENEM: 1.0000 | ENEMA | Freq: Once | RECTAL | Status: DC

## 2023-07-18 MED ORDER — FLEET ENEMA RE ENEM
1.0000 | ENEMA | Freq: Once | RECTAL | Status: DC
Start: 2023-07-18 — End: 2023-07-19

## 2023-07-18 MED ORDER — DOXYCYCLINE HYCLATE 100 MG PO TABS: 100.0000 mg | ORAL_TABLET | Freq: Two times a day (BID) | ORAL | 0 refills | Status: DC

## 2023-07-18 MED ORDER — SODIUM CHLORIDE 0.9 % IV SOLN
2.0000 g | INTRAVENOUS | Status: AC
  Administered 2023-07-19: 2 g via INTRAVENOUS
  Filled 2023-07-18: qty 20

## 2023-07-18 MED ORDER — GABAPENTIN 300 MG PO CAPS
300.0000 mg | ORAL_CAPSULE | ORAL | Status: AC
  Administered 2023-07-19: 300 mg via ORAL

## 2023-07-18 MED ORDER — ACETAMINOPHEN 500 MG PO TABS
1000.0000 mg | ORAL_TABLET | ORAL | Status: AC
  Administered 2023-07-19: 1000 mg via ORAL

## 2023-07-18 MED ORDER — METRONIDAZOLE 500 MG PO TABS
500.0000 mg | ORAL_TABLET | Freq: Three times a day (TID) | ORAL | 0 refills | Status: AC
Start: 2023-07-18 — End: 2023-07-28

## 2023-07-18 MED ORDER — BUPIVACAINE LIPOSOME 1.3 % IJ SUSP: 20.0000 mL | Freq: Once | INTRAMUSCULAR | Status: DC

## 2023-07-18 NOTE — Assessment & Plan Note (Signed)
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 Which she had I&D of 2 years ago.  Based on HPI and physical, appears to be recurrence. Given the area that is affected and the scar tissue present, provider referred pt to [facility] for assessment and surgical management.   Placed order for oral antibiotic to pt to protect for any underlying infection within the tissue.  Provider called [facility] with referral they were able to see her in afternoon on 07/18/23

## 2023-07-18 NOTE — Telephone Encounter (Signed)
Patient has been advised of Pre-Admission date/time, and Surgery date at [facility]  Surgery Date: 07/19/23 Preadmission Testing Date: 07/19/23 work in for following day, patient instructed to arrive @ 10:00 am at [facility], 2nd floor to surgery.  Patient given surgery instructions while in clinic today and verbalized understanding.

## 2023-07-18 NOTE — H&P (View-Only) (Signed)
 Patient ID: Melanie Valdez, female   DOB: Feb 22, 1991, 32 y.o.   MRN: [id number]  Chief Complaint: Recurrent perirectal abscess  History of Present Illness Melanie Valdez is a 32 y.o. female with prior I&D approximately 2 years ago of perineal abscess.  Located anteriorly on the patient's left, confirmed with ultrasound and underwent I&D, followed by 6 weeks of packing to subsequent healing.  Had GI evaluation and felt to have IBS with primarily diarrhea.  She currently has 6-7 bowel movements per day, taking Imodium twice in the morning and then holding Imodium as to avoid cramping throughout the remainder of the day.  Prior I&D cultures grew out Bacteroides fragilis and E. coli. Monday began having swelling and pain/tenderness in the same area groin progressively tender over the last 3 days.  She has no fevers or chills.  She has had no new fistula drainage or output.  She reports her bowel habits have had a slight change with more mucousy and slightly more foul odor.  Past Medical History Past Medical History:  Diagnosis Date   Asthma    Complication of anesthesia    PVC's (premature ventricular contractions)       Past Surgical History:  Procedure Laterality Date   BREAST REDUCTION SURGERY     BREAST SURGERY     CESAREAN SECTION  2018   COLONOSCOPY WITH PROPOFOL N/A 06/16/2019   Procedure: COLONOSCOPY WITH PROPOFOL;  Surgeon: Pasty Spillers, MD;  Location: [facility] ENDOSCOPY;  Service: Endoscopy;  Laterality: N/A;   LEEP  2015   REDUCTION MAMMAPLASTY     TONSILLECTOMY     TONSILLECTOMY AND ADENOIDECTOMY  1999   WISDOM TOOTH EXTRACTION  2009    Allergies  Allergen Reactions   Sulfa Antibiotics Anaphylaxis and Itching   Codeine Nausea And Vomiting   Hydrocodone-Acetaminophen Itching    Other reaction(s): mild rash/itching    Current Outpatient Medications  Medication Sig Dispense Refill   albuterol (VENTOLIN HFA) 108 (90 Base) MCG/ACT inhaler Inhale 1-2 puffs into the lungs  every 6 (six) hours as needed. 1 each 1   amphetamine-dextroamphetamine (ADDERALL XR) 10 MG 24 hr capsule Take 1 capsule (10 mg total) by mouth daily. 30 capsule 0   amphetamine-dextroamphetamine (ADDERALL XR) 10 MG 24 hr capsule Take 1 capsule (10 mg total) by mouth daily. 30 capsule 0   amphetamine-dextroamphetamine (ADDERALL XR) 10 MG 24 hr capsule Take 1 capsule (10 mg total) by mouth daily. 30 capsule 0   Cetirizine HCl (ZYRTEC ALLERGY) 10 MG CAPS Take 5 mg by mouth daily.      ciprofloxacin (CIPRO) 500 MG tablet Take 1 tablet (500 mg total) by mouth 2 (two) times daily for 5 days. 10 tablet 0   clobetasol ointment (TEMOVATE) 0.05 % Apply 1 Application topically 2 (two) times daily. 60 g 2   fexofenadine (ALLEGRA) 180 MG tablet Take 180 mg by mouth daily.     fluticasone (FLONASE) 50 MCG/ACT nasal spray Place 2 sprays into both nostrils daily.     gabapentin (NEURONTIN) 300 MG capsule Take 300 mg by mouth 3 (three) times daily.     Loperamide HCl (IMODIUM PO) Take by mouth as needed.     metroNIDAZOLE (FLAGYL) 500 MG tablet Take 1 tablet (500 mg total) by mouth 3 (three) times daily for 10 days. 30 tablet 0   montelukast (SINGULAIR) 10 MG tablet TAKE 1 TABLET BY MOUTH EVERYDAY AT BEDTIME 90 tablet 0   norethindrone (MICRONOR) 0.35 MG tablet Take 1 tablet by  mouth daily.     triamcinolone (KENALOG) 0.025 % cream Apply 1 Application topically.     Ferrous Sulfate (IRON PO) Take 1 tablet by mouth daily. (Patient not taking: Reported on 07/18/2023)     No current facility-administered medications for this visit.    Family History Family History  Problem Relation Age of Onset   Hypertension Maternal Grandmother    Kidney disease Maternal Grandmother    Breast cancer Maternal Grandmother    Heart disease Maternal Grandfather    Breast cancer Other        MGGGM   Breast cancer Other        Mgreataunt   Breast cancer Other        m 2nd cousin      Social History Social History    Tobacco Use   Smoking status: Never    Passive exposure: Never   Smokeless tobacco: Never  Vaping Use   Vaping status: Never Used  Substance Use Topics   Alcohol use: Yes    Alcohol/week: 1.0 standard drink of alcohol    Types: 1 Glasses of wine per week   Drug use: Never        Review of Systems  Constitutional: Negative.   HENT:  Positive for hearing loss.   Eyes: Negative.   Respiratory: Negative.    Cardiovascular: Negative.   Gastrointestinal:  Positive for abdominal pain and diarrhea. Negative for blood in stool, constipation, heartburn, nausea and vomiting.  Genitourinary: Negative.   Skin: Negative.   Neurological: Negative.   Psychiatric/Behavioral: Negative.       Physical Exam Blood pressure 132/83, pulse 88, temperature 98 F (36.7 C), height 5\' 2"  (1.575 m), weight 168 lb (76.2 kg), last menstrual period 07/03/2023, SpO2 99%. Last Weight  Most recent update: 07/18/2023  2:43 PM    Weight  76.2 kg (168 lb)             CONSTITUTIONAL: Well developed, and nourished, appropriately responsive and aware without distress.   EYES: Sclera non-icteric.   EARS, NOSE, MOUTH AND THROAT:  The oropharynx is clear. Oral mucosa is pink and moist.    Hearing is intact to voice.  NECK: Trachea is midline, and there is no jugular venous distension.  LYMPH NODES:  Lymph nodes in the neck are not appreciated. RESPIRATORY:  Lungs are clear, and breath sounds are equal bilaterally.  Normal respiratory effort without pathologic use of accessory muscles. CARDIOVASCULAR: Heart is regular in rate and rhythm.   Well perfused.  GI: The abdomen is  soft, nontender, and nondistended. There were no palpable masses.  I did not appreciate hepatosplenomegaly. GU: Caryl Lyn present as chaperone.  There is a scar present on the patient's left just lateral to the perineum, centered over the perineal body, just posterior to this closer to the anal verge there is a more palpable area of  tenderness which on ultrasound appears to be consistent with abscess.  The overlying skin does not appear hyperemic or indurated.  There is correlating tenderness with digital rectal exam.  But no fluctuance on DRE.  So anticipate this abscess to be about 2 cm, not much more than that. MUSCULOSKELETAL:  Symmetrical muscle tone appreciated in all four extremities.    SKIN: Skin turgor is normal. No pathologic skin lesions appreciated.  NEUROLOGIC:  Motor and sensation appear grossly normal.  Cranial nerves are grossly without defect. PSYCH:  Alert and oriented to person, place and time. Affect is appropriate for situation.  Data Reviewed I have personally reviewed what is currently available of the patient's imaging, recent labs and medical records.   Labs:     Latest Ref Rng & Units 05/28/2023   11:17 AM 05/30/2022    9:23 AM 12/28/2020    9:16 AM  CBC  WBC 3.4 - 10.8 x10E3/uL 6.0  5.5  8.2   Hemoglobin 11.1 - 15.9 g/dL 16.1  09.6  04.5   Hematocrit 34.0 - 46.6 % 40.0  38.1  41.3   Platelets 150 - 450 x10E3/uL 204  200  211       Latest Ref Rng & Units 05/28/2023   11:17 AM 05/30/2022    9:23 AM 12/28/2020    9:16 AM  CMP  Glucose 70 - 99 mg/dL 92  95  85   BUN 6 - 20 mg/dL 19  18  10    Creatinine 0.57 - 1.00 mg/dL 4.09  8.11  9.14   Sodium 134 - 144 mmol/L 142  142  140   Potassium 3.5 - 5.2 mmol/L 4.7  4.5  4.4   Chloride 96 - 106 mmol/L 102  106  101   CO2 20 - 29 mmol/L 24  23  24    Calcium 8.7 - 10.2 mg/dL 9.9  9.4  9.7   Total Protein 6.0 - 8.5 g/dL 7.4  7.1  7.7   Total Bilirubin 0.0 - 1.2 mg/dL 0.8  1.1  1.0   Alkaline Phos 44 - 121 IU/L 122  75  77   AST 0 - 40 IU/L 39  11  10   ALT 0 - 32 IU/L 74  13  15       Imaging: Radiological images reviewed:   Within last 24 hrs: No results found.  Assessment    Recurrent perirectal abscess. 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  Will schedule MRI to evaluate further for potential fistula. Refer for repeat GI consultation/evaluation, and consideration of possible ID evaluation.  We discussed at length this patient's ongoing GI issues, I believe it is well worthwhile revisiting their specialty for additional consultation.  Consider ID consult. Risks of general anesthesia, bleeding, infection, recurrence discussed in detail with patient.  We discussed the possibility of seton placement should an obvious fistula be noted.  Otherwise we will do all we can to avoid any additional harm. Face-to-face time spent with the patient and accompanying care providers(if present) was 40 minutes, with more than 50% of the time spent counseling, educating, and coordinating care of the patient.    These notes generated with voice recognition software. I apologize for typographical errors.  Campbell Lerner M.D., FACS 07/18/2023, 3:31 PM

## 2023-07-18 NOTE — Patient Instructions (Addendum)
We will get you scheduled for a pelvic MRI to better assess this area and check for a fistula tract. We will call you about imaging.   We will send in a prescription for Flagyl & Cipro for you to start this medication today.    We have spoken today about having and I&D in the OR. This will be done by Dr. Claudine Mouton at [facility].  You will most likely be able to leave the hospital several hours after your surgery.   Plan to tentatively be off work for 3-4 days following the surgery. Please see your Blue surgery sheet for more information. Our surgery scheduler will call you to look at surgery dates and to go over information.   If you have FMLA or Disability paperwork that needs to be filled out, please have your company fax your paperwork to [phone] or you may drop this by either office. This paperwork will be filled out within 3 days after your surgery has been completed.

## 2023-07-18 NOTE — Progress Notes (Signed)
Patient ID: Melanie Valdez, female   DOB: Feb 22, 1991, 32 y.o.   MRN: [id number]  Chief Complaint: Recurrent perirectal abscess  History of Present Illness Melanie Valdez is a 32 y.o. female with prior I&D approximately 2 years ago of perineal abscess.  Located anteriorly on the patient's left, confirmed with ultrasound and underwent I&D, followed by 6 weeks of packing to subsequent healing.  Had GI evaluation and felt to have IBS with primarily diarrhea.  She currently has 6-7 bowel movements per day, taking Imodium twice in the morning and then holding Imodium as to avoid cramping throughout the remainder of the day.  Prior I&D cultures grew out Bacteroides fragilis and E. coli. Monday began having swelling and pain/tenderness in the same area groin progressively tender over the last 3 days.  She has no fevers or chills.  She has had no new fistula drainage or output.  She reports her bowel habits have had a slight change with more mucousy and slightly more foul odor.  Past Medical History Past Medical History:  Diagnosis Date   Asthma    Complication of anesthesia    PVC's (premature ventricular contractions)       Past Surgical History:  Procedure Laterality Date   BREAST REDUCTION SURGERY     BREAST SURGERY     CESAREAN SECTION  2018   COLONOSCOPY WITH PROPOFOL N/A 06/16/2019   Procedure: COLONOSCOPY WITH PROPOFOL;  Surgeon: Pasty Spillers, MD;  Location: [facility] ENDOSCOPY;  Service: Endoscopy;  Laterality: N/A;   LEEP  2015   REDUCTION MAMMAPLASTY     TONSILLECTOMY     TONSILLECTOMY AND ADENOIDECTOMY  1999   WISDOM TOOTH EXTRACTION  2009    Allergies  Allergen Reactions   Sulfa Antibiotics Anaphylaxis and Itching   Codeine Nausea And Vomiting   Hydrocodone-Acetaminophen Itching    Other reaction(s): mild rash/itching    Current Outpatient Medications  Medication Sig Dispense Refill   albuterol (VENTOLIN HFA) 108 (90 Base) MCG/ACT inhaler Inhale 1-2 puffs into the lungs  every 6 (six) hours as needed. 1 each 1   amphetamine-dextroamphetamine (ADDERALL XR) 10 MG 24 hr capsule Take 1 capsule (10 mg total) by mouth daily. 30 capsule 0   amphetamine-dextroamphetamine (ADDERALL XR) 10 MG 24 hr capsule Take 1 capsule (10 mg total) by mouth daily. 30 capsule 0   amphetamine-dextroamphetamine (ADDERALL XR) 10 MG 24 hr capsule Take 1 capsule (10 mg total) by mouth daily. 30 capsule 0   Cetirizine HCl (ZYRTEC ALLERGY) 10 MG CAPS Take 5 mg by mouth daily.      ciprofloxacin (CIPRO) 500 MG tablet Take 1 tablet (500 mg total) by mouth 2 (two) times daily for 5 days. 10 tablet 0   clobetasol ointment (TEMOVATE) 0.05 % Apply 1 Application topically 2 (two) times daily. 60 g 2   fexofenadine (ALLEGRA) 180 MG tablet Take 180 mg by mouth daily.     fluticasone (FLONASE) 50 MCG/ACT nasal spray Place 2 sprays into both nostrils daily.     gabapentin (NEURONTIN) 300 MG capsule Take 300 mg by mouth 3 (three) times daily.     Loperamide HCl (IMODIUM PO) Take by mouth as needed.     metroNIDAZOLE (FLAGYL) 500 MG tablet Take 1 tablet (500 mg total) by mouth 3 (three) times daily for 10 days. 30 tablet 0   montelukast (SINGULAIR) 10 MG tablet TAKE 1 TABLET BY MOUTH EVERYDAY AT BEDTIME 90 tablet 0   norethindrone (MICRONOR) 0.35 MG tablet Take 1 tablet by  mouth daily.     triamcinolone (KENALOG) 0.025 % cream Apply 1 Application topically.     Ferrous Sulfate (IRON PO) Take 1 tablet by mouth daily. (Patient not taking: Reported on 07/18/2023)     No current facility-administered medications for this visit.    Family History Family History  Problem Relation Age of Onset   Hypertension Maternal Grandmother    Kidney disease Maternal Grandmother    Breast cancer Maternal Grandmother    Heart disease Maternal Grandfather    Breast cancer Other        MGGGM   Breast cancer Other        Mgreataunt   Breast cancer Other        m 2nd cousin      Social History Social History    Tobacco Use   Smoking status: Never    Passive exposure: Never   Smokeless tobacco: Never  Vaping Use   Vaping status: Never Used  Substance Use Topics   Alcohol use: Yes    Alcohol/week: 1.0 standard drink of alcohol    Types: 1 Glasses of wine per week   Drug use: Never        Review of Systems  Constitutional: Negative.   HENT:  Positive for hearing loss.   Eyes: Negative.   Respiratory: Negative.    Cardiovascular: Negative.   Gastrointestinal:  Positive for abdominal pain and diarrhea. Negative for blood in stool, constipation, heartburn, nausea and vomiting.  Genitourinary: Negative.   Skin: Negative.   Neurological: Negative.   Psychiatric/Behavioral: Negative.       Physical Exam Blood pressure 132/83, pulse 88, temperature 98 F (36.7 C), height 5\' 2"  (1.575 m), weight 168 lb (76.2 kg), last menstrual period 07/03/2023, SpO2 99%. Last Weight  Most recent update: 07/18/2023  2:43 PM    Weight  76.2 kg (168 lb)             CONSTITUTIONAL: Well developed, and nourished, appropriately responsive and aware without distress.   EYES: Sclera non-icteric.   EARS, NOSE, MOUTH AND THROAT:  The oropharynx is clear. Oral mucosa is pink and moist.    Hearing is intact to voice.  NECK: Trachea is midline, and there is no jugular venous distension.  LYMPH NODES:  Lymph nodes in the neck are not appreciated. RESPIRATORY:  Lungs are clear, and breath sounds are equal bilaterally.  Normal respiratory effort without pathologic use of accessory muscles. CARDIOVASCULAR: Heart is regular in rate and rhythm.   Well perfused.  GI: The abdomen is  soft, nontender, and nondistended. There were no palpable masses.  I did not appreciate hepatosplenomegaly. GU: Caryl Lyn present as chaperone.  There is a scar present on the patient's left just lateral to the perineum, centered over the perineal body, just posterior to this closer to the anal verge there is a more palpable area of  tenderness which on ultrasound appears to be consistent with abscess.  The overlying skin does not appear hyperemic or indurated.  There is correlating tenderness with digital rectal exam.  But no fluctuance on DRE.  So anticipate this abscess to be about 2 cm, not much more than that. MUSCULOSKELETAL:  Symmetrical muscle tone appreciated in all four extremities.    SKIN: Skin turgor is normal. No pathologic skin lesions appreciated.  NEUROLOGIC:  Motor and sensation appear grossly normal.  Cranial nerves are grossly without defect. PSYCH:  Alert and oriented to person, place and time. Affect is appropriate for situation.  Data Reviewed I have personally reviewed what is currently available of the patient's imaging, recent labs and medical records.   Labs:     Latest Ref Rng & Units 05/28/2023   11:17 AM 05/30/2022    9:23 AM 12/28/2020    9:16 AM  CBC  WBC 3.4 - 10.8 x10E3/uL 6.0  5.5  8.2   Hemoglobin 11.1 - 15.9 g/dL 16.1  09.6  04.5   Hematocrit 34.0 - 46.6 % 40.0  38.1  41.3   Platelets 150 - 450 x10E3/uL 204  200  211       Latest Ref Rng & Units 05/28/2023   11:17 AM 05/30/2022    9:23 AM 12/28/2020    9:16 AM  CMP  Glucose 70 - 99 mg/dL 92  95  85   BUN 6 - 20 mg/dL 19  18  10    Creatinine 0.57 - 1.00 mg/dL 4.09  8.11  9.14   Sodium 134 - 144 mmol/L 142  142  140   Potassium 3.5 - 5.2 mmol/L 4.7  4.5  4.4   Chloride 96 - 106 mmol/L 102  106  101   CO2 20 - 29 mmol/L 24  23  24    Calcium 8.7 - 10.2 mg/dL 9.9  9.4  9.7   Total Protein 6.0 - 8.5 g/dL 7.4  7.1  7.7   Total Bilirubin 0.0 - 1.2 mg/dL 0.8  1.1  1.0   Alkaline Phos 44 - 121 IU/L 122  75  77   AST 0 - 40 IU/L 39  11  10   ALT 0 - 32 IU/L 74  13  15       Imaging: Radiological images reviewed:   Within last 24 hrs: No results found.  Assessment    Recurrent perirectal abscess. 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  Will schedule MRI to evaluate further for potential fistula. Refer for repeat GI consultation/evaluation, and consideration of possible ID evaluation.  We discussed at length this patient's ongoing GI issues, I believe it is well worthwhile revisiting their specialty for additional consultation.  Consider ID consult. Risks of general anesthesia, bleeding, infection, recurrence discussed in detail with patient.  We discussed the possibility of seton placement should an obvious fistula be noted.  Otherwise we will do all we can to avoid any additional harm. Face-to-face time spent with the patient and accompanying care providers(if present) was 40 minutes, with more than 50% of the time spent counseling, educating, and coordinating care of the patient.    These notes generated with voice recognition software. I apologize for typographical errors.  Campbell Lerner M.D., FACS 07/18/2023, 3:31 PM

## 2023-07-18 NOTE — Progress Notes (Signed)
Acute Office Visit  Introduced to nurse practitioner role and practice setting.  All questions answered.  Discussed provider/patient relationship and expectations.  Subjective:     Patient ID: Melanie Valdez, female    DOB: November 02, 1990, 32 y.o.   MRN: [id number]  Chief Complaint  Patient presents with   Pelvic Pain    January 2023 she had a pocket of infected fluid above her pelvic area. She is now having the same symptoms as before with pain and she can feel a knot in that area.    HPI  Pt presents today with concern for recurrence of deep abscess in perineum and gluteal rectal region. States two years ago she had the same episode, which lead to an emergency visit and I&D of abscess and weeks of self packing of abscess for healing. She is concerned she is developing the same abscess in the exact same area, and wants its taken care of before its leads to worsening infection or an ED visit. She can feel a "knot" developing.   Denies any drainage or bleeding from affected area.   She is 12 weeks postpartum, on birth control, as had two periods. Denies dysuria, vaginal discharge, odor, or itching. Denies nausea, vomiting, constipation, bloody stool, or melena, but has had increased diarrhea past couple days. States has pain when sitting and bearing down with defecation.   Denies systemic symptoms - no fevers, chills, rigors, palpitations, shortness of breath or difficulty breathing.   Review of Systems  All other systems reviewed and are negative.     Objective:    BP 122/83 (BP Location: Left Arm, Patient Position: Sitting, Cuff Size: Normal)   Pulse 87   Temp 98.5 F (36.9 C)   Wt 169 lb (76.7 kg)   SpO2 100%   BMI 30.91 kg/m    Physical Exam Exam conducted with a chaperone present.  Constitutional:      Appearance: Normal appearance. She is normal weight.  HENT:     Head: Normocephalic.  Eyes:     Pupils: Pupils are equal, round, and reactive to light.   Cardiovascular:     Rate and Rhythm: Normal rate and regular rhythm.     Pulses: Normal pulses.     Heart sounds: Normal heart sounds.  Pulmonary:     Effort: Pulmonary effort is normal.     Breath sounds: Normal breath sounds.  Abdominal:     Hernia: There is no hernia in the left inguinal area or right inguinal area.  Genitourinary:    General: Normal vulva.     Exam position: Lithotomy position.     Pubic Area: No rash.      Labia:        Right: No rash, tenderness, lesion or injury.        Left: Tenderness present. No rash, lesion or injury.      Urethra: No prolapse, urethral pain, urethral swelling or urethral lesion.       Comments: At 5'oclock postion, Left region between labia and gluteal cleft, pt has tenderness to light and deep palpitation. No obvious signs of infection or wound visually, no drainage or lesion present, but to palpitation about 2cm deep in affected area and is about the size of a large grape, 2X2cm. Lymphadenopathy:     Lower Body: No right inguinal adenopathy. No left inguinal adenopathy.  Neurological:     Mental Status: She is alert.    No results found for any visits on 07/18/23.  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 Which she had I&D of 2 years ago.  Based on HPI and physical, appears to be recurrence. Given the area that is affected and the scar tissue present, provider referred pt to [facility] for assessment and surgical management.   Placed order for oral antibiotic to pt to protect for any underlying infection within the tissue.  Provider called [facility] with referral they were able to see her in afternoon on 07/18/23      Relevant Orders   Ambulatory referral to General Surgery    Meds ordered this encounter  Medications   DISCONTD:  doxycycline (VIBRA-TABS) 100 MG tablet    Sig: Take 1 tablet (100 mg total) by mouth 2 (two) times daily.    Dispense:  10 tablet    Refill:  0    Return if symptoms worsen or fail to improve. Referred to [facility].  F/u as needed with pcp.    I, Sallee Provencal, FNP, have reviewed all documentation for this visit. The documentation on 07/18/23 for the exam, diagnosis, procedures, and orders are all accurate and complete.    Sallee Provencal, FNP

## 2023-07-19 ENCOUNTER — Other Ambulatory Visit: Payer: Self-pay

## 2023-07-19 ENCOUNTER — Encounter
Admission: RE | Disposition: A | Discharge status: Home / Self Care | Payer: Self-pay | Source: Home / Self Care | Attending: Surgery

## 2023-07-19 ENCOUNTER — Encounter: Payer: Self-pay | Admitting: Surgery

## 2023-07-19 ENCOUNTER — Ambulatory Visit: Payer: 59 | Admitting: Certified Registered"

## 2023-07-19 ENCOUNTER — Ambulatory Visit
Admission: RE | Admit: 2023-07-19 | Discharge: 2023-07-19 | Disposition: A | Discharge status: Home / Self Care | Payer: 59 | Attending: Surgery | Admitting: Surgery

## 2023-07-19 DIAGNOSIS — K611 Rectal abscess: Secondary | ICD-10-CM | POA: Insufficient documentation

## 2023-07-19 DIAGNOSIS — J45909 Unspecified asthma, uncomplicated: Secondary | ICD-10-CM | POA: Insufficient documentation

## 2023-07-19 DIAGNOSIS — L02215 Cutaneous abscess of perineum: Secondary | ICD-10-CM | POA: Diagnosis not present

## 2023-07-19 DIAGNOSIS — Z01812 Encounter for preprocedural laboratory examination: Secondary | ICD-10-CM

## 2023-07-19 DIAGNOSIS — K589 Irritable bowel syndrome without diarrhea: Secondary | ICD-10-CM | POA: Insufficient documentation

## 2023-07-19 LAB — CBC WITH DIFFERENTIAL/PLATELET
Abs Immature Granulocytes: 0.01 10*3/uL (ref 0.00–0.07)
Basophils Absolute: 0 10*3/uL (ref 0.0–0.1)
Basophils Relative: 1 %
Eosinophils Absolute: 0.1 10*3/uL (ref 0.0–0.5)
Eosinophils Relative: 2 %
HCT: 38 % (ref 36.0–46.0)
Hemoglobin: 12.7 g/dL (ref 12.0–15.0)
Immature Granulocytes: 0 %
Lymphocytes Relative: 26 %
Lymphs Abs: 1.5 10*3/uL (ref 0.7–4.0)
MCH: 28.6 pg (ref 26.0–34.0)
MCHC: 33.4 g/dL (ref 30.0–36.0)
MCV: 85.6 fL (ref 80.0–100.0)
Monocytes Absolute: 0.4 10*3/uL (ref 0.1–1.0)
Monocytes Relative: 6 %
Neutro Abs: 3.8 10*3/uL (ref 1.7–7.7)
Neutrophils Relative %: 65 %
Platelets: 167 10*3/uL (ref 150–400)
RBC: 4.44 MIL/uL (ref 3.87–5.11)
RDW: 13.6 % (ref 11.5–15.5)
WBC: 5.9 10*3/uL (ref 4.0–10.5)
nRBC: 0 % (ref 0.0–0.2)

## 2023-07-19 LAB — BASIC METABOLIC PANEL
Anion gap: 9 (ref 5–15)
BUN: 12 mg/dL (ref 6–20)
CO2: 24 mmol/L (ref 22–32)
Calcium: 8.9 mg/dL (ref 8.9–10.3)
Chloride: 104 mmol/L (ref 98–111)
Creatinine, Ser: 0.98 mg/dL (ref 0.44–1.00)
GFR, Estimated: >60 mL/min (ref >60)
Glucose, Bld: 96 mg/dL (ref 70–99)
Potassium: 3.9 mmol/L (ref 3.5–5.1)
Sodium: 137 mmol/L (ref 135–145)

## 2023-07-19 LAB — POCT PREGNANCY, URINE: Preg Test, Ur: NEGATIVE

## 2023-07-19 SURGERY — EXAM UNDER ANESTHESIA
Anesthesia: General

## 2023-07-19 MED ORDER — BUPIVACAINE-EPINEPHRINE (PF) 0.25% -1:200000 IJ SOLN
INTRAMUSCULAR | Status: AC
  Filled 2023-07-19: qty 30

## 2023-07-19 MED ORDER — DROPERIDOL 2.5 MG/ML IJ SOLN: 0.6250 mg | Freq: Once | INTRAMUSCULAR | Status: DC

## 2023-07-19 MED ORDER — LIDOCAINE HCL (PF) 2 % IJ SOLN
INTRAMUSCULAR | Status: DC | PRN
  Administered 2023-07-19: 100 mg via INTRADERMAL

## 2023-07-19 MED ORDER — ONDANSETRON HCL 4 MG/2ML IJ SOLN
INTRAMUSCULAR | Status: DC | PRN
  Administered 2023-07-19: 4 mg via INTRAVENOUS

## 2023-07-19 MED ORDER — 0.9 % SODIUM CHLORIDE (POUR BTL) OPTIME
TOPICAL | Status: DC | PRN
  Administered 2023-07-19: 500 mL

## 2023-07-19 MED ORDER — CHLORHEXIDINE GLUCONATE 0.12 % MT SOLN
OROMUCOSAL | Status: AC
  Filled 2023-07-19: qty 15

## 2023-07-19 MED ORDER — OXYCODONE HCL 5 MG PO TABS: 5.0000 mg | ORAL_TABLET | Freq: Four times a day (QID) | ORAL | 0 refills | Status: DC | PRN

## 2023-07-19 MED ORDER — OXYCODONE HCL 5 MG PO TABS: 5.0000 mg | ORAL_TABLET | Freq: Once | ORAL | Status: DC | PRN

## 2023-07-19 MED ORDER — BUPIVACAINE-EPINEPHRINE (PF) 0.25% -1:200000 IJ SOLN
INTRAMUSCULAR | Status: DC | PRN
  Administered 2023-07-19: 30 mL via INTRAMUSCULAR

## 2023-07-19 MED ORDER — DEXAMETHASONE SODIUM PHOSPHATE 10 MG/ML IJ SOLN
INTRAMUSCULAR | Status: DC | PRN
  Administered 2023-07-19: 10 mg via INTRAVENOUS

## 2023-07-19 MED ORDER — PROPOFOL 500 MG/50ML IV EMUL
INTRAVENOUS | Status: DC | PRN
  Administered 2023-07-19: 150 ug/kg/min via INTRAVENOUS
  Administered 2023-07-19: 50 mg via INTRAVENOUS

## 2023-07-19 MED ORDER — ORAL CARE MOUTH RINSE: 15.0000 mL | Freq: Once | OROMUCOSAL | Status: AC

## 2023-07-19 MED ORDER — CHLORHEXIDINE GLUCONATE 0.12 % MT SOLN
15.0000 mL | Freq: Once | OROMUCOSAL | Status: AC
  Administered 2023-07-19: 15 mL via OROMUCOSAL

## 2023-07-19 MED ORDER — FENTANYL CITRATE (PF) 100 MCG/2ML IJ SOLN
INTRAMUSCULAR | Status: AC
  Filled 2023-07-19: qty 2

## 2023-07-19 MED ORDER — FENTANYL CITRATE (PF) 100 MCG/2ML IJ SOLN
25.0000 ug | INTRAMUSCULAR | Status: DC | PRN
Start: 2023-07-19 — End: 2023-07-19

## 2023-07-19 MED ORDER — IBUPROFEN 800 MG PO TABS: 800.0000 mg | ORAL_TABLET | Freq: Three times a day (TID) | ORAL | 0 refills | Status: DC | PRN

## 2023-07-19 MED ORDER — BUPIVACAINE LIPOSOME 1.3 % IJ SUSP
INTRAMUSCULAR | Status: AC
  Filled 2023-07-19: qty 20

## 2023-07-19 MED ORDER — MIDAZOLAM HCL 2 MG/2ML IJ SOLN
INTRAMUSCULAR | Status: DC | PRN
  Administered 2023-07-19: 2 mg via INTRAVENOUS

## 2023-07-19 MED ORDER — DEXMEDETOMIDINE HCL IN NACL 80 MCG/20ML IV SOLN
INTRAVENOUS | Status: DC | PRN
  Administered 2023-07-19: 8 ug via INTRAVENOUS

## 2023-07-19 MED ORDER — GABAPENTIN 300 MG PO CAPS
ORAL_CAPSULE | ORAL | Status: AC
  Filled 2023-07-19: qty 1

## 2023-07-19 MED ORDER — OXYCODONE HCL 5 MG/5ML PO SOLN: 5.0000 mg | Freq: Once | ORAL | Status: DC | PRN

## 2023-07-19 MED ORDER — FENTANYL CITRATE (PF) 100 MCG/2ML IJ SOLN
INTRAMUSCULAR | Status: DC | PRN
  Administered 2023-07-19: 25 ug via INTRAVENOUS
  Administered 2023-07-19: 50 ug via INTRAVENOUS

## 2023-07-19 MED ORDER — LACTATED RINGERS IV SOLN: INTRAVENOUS | Status: DC

## 2023-07-19 MED ORDER — ACETAMINOPHEN 500 MG PO TABS
ORAL_TABLET | ORAL | Status: AC
  Filled 2023-07-19: qty 2

## 2023-07-19 MED ORDER — MIDAZOLAM HCL 2 MG/2ML IJ SOLN
INTRAMUSCULAR | Status: AC
  Filled 2023-07-19: qty 2

## 2023-07-19 SURGICAL SUPPLY — 27 items
BLADE SURG 15 STRL LF DISP TIS (BLADE) ×1 IMPLANT
DRAIN PENROSE 12X.25 LTX STRL (MISCELLANEOUS) IMPLANT
DRAIN PENROSE 5/8X18 LTX STRL (DRAIN) IMPLANT
DRAPE LAPAROTOMY 77X122 PED (DRAPES) ×1 IMPLANT
DRAPE PERI LITHO V/GYN (MISCELLANEOUS) IMPLANT
ELECT CAUTERY BLADE 6.4 (BLADE) ×1 IMPLANT
ELECT REM PT RETURN 9FT ADLT (ELECTROSURGICAL) ×1
ELECTRODE REM PT RTRN 9FT ADLT (ELECTROSURGICAL) ×1 IMPLANT
GAUZE SPONGE 4X4 12PLY STRL (GAUZE/BANDAGES/DRESSINGS) IMPLANT
GLOVE ORTHO TXT STRL SZ7.5 (GLOVE) ×1 IMPLANT
GOWN STRL REUS W/ TWL LRG LVL3 (GOWN DISPOSABLE) ×1 IMPLANT
GOWN STRL REUS W/ TWL XL LVL3 (GOWN DISPOSABLE) ×1 IMPLANT
KIT TURNOVER KIT A (KITS) ×1 IMPLANT
MANIFOLD NEPTUNE II (INSTRUMENTS) ×1 IMPLANT
NDL HYPO 22X1.5 SAFETY MO (MISCELLANEOUS) ×1 IMPLANT
NEEDLE HYPO 22X1.5 SAFETY MO (MISCELLANEOUS) ×1 IMPLANT
NS IRRIG 1000ML POUR BTL (IV SOLUTION) ×1 IMPLANT
PACK BASIN MINOR ARMC (MISCELLANEOUS) ×1 IMPLANT
PAD ABD DERMACEA PRESS 5X9 (GAUZE/BANDAGES/DRESSINGS) IMPLANT
SOL PREP PVP 2OZ (MISCELLANEOUS) ×1
SOLUTION PREP PVP 2OZ (MISCELLANEOUS) ×1 IMPLANT
SPONGE T-LAP 18X18 ~~LOC~~+RFID (SPONGE) IMPLANT
SUT ETHILON 3-0 FS-10 30 BLK (SUTURE)
SUTURE EHLN 3-0 FS-10 30 BLK (SUTURE) IMPLANT
SWAB CULTURE AMIES ANAERIB BLU (MISCELLANEOUS) IMPLANT
SYR 10ML LL (SYRINGE) ×1 IMPLANT
TRAP FLUID SMOKE EVACUATOR (MISCELLANEOUS) ×1 IMPLANT

## 2023-07-19 NOTE — Interval H&P Note (Signed)
History and Physical Interval Note:  07/19/2023 10:35 AM  Melanie Valdez  has presented today for surgery, with the diagnosis of Fistula of perineum.  The various methods of treatment have been discussed with the patient and family. After consideration of risks, benefits and other options for treatment, the patient has consented to  Procedure(s): EXAM UNDER ANESTHESIA (N/A) IRRIGATION AND DEBRIDEMENT PERIRECTAL ABSCESS (N/A) as a surgical intervention.  The patient's history has been reviewed, patient examined, no change in status, stable for surgery.  I have reviewed the patient's chart and labs.  Questions were answered to the patient's satisfaction.     Campbell Lerner

## 2023-07-19 NOTE — Op Note (Signed)
Anorectal examination under anesthesia  Pre-operative Diagnosis: Recurrent perineal abscess/perirectal abscess.  Post-operative Diagnosis: Possible early recurrent perineal abscess.   Surgeon: Campbell Lerner, M.D., FACS  Anesthesia: General  Findings: Dense scar, at area of prior I&D, most posterior aspect of left sided perineal scar.  Bandlike scar connecting to perineal body midline.  Markedly tender on exam, without fluctuance.  Aspiration attempt of lesion with a few drops of blood uncertain if this is from the mass itself or adjacent soft tissue.  Estimated Blood Loss: 2 mL         Specimens: Aspirate, and attempt to localize cystic lesion/recurrent abscess.  Sent for culture and sensitivity.          Complications: none              Procedure Details  The patient was seen again in the Holding Room. The benefits, complications, treatment options, and expected outcomes were discussed with the patient. The risks of bleeding, infection, recurrence of symptoms, failure to resolve symptoms, unanticipated injury, prosthetic placement, prosthetic infection, any of which could require further surgery were reviewed with the patient. The likelihood of improving the patient's symptoms with return to their baseline status is expected.  The patient and/or family concurred with the proposed plan, giving informed consent.  The patient was taken to Operating Room, identified and the procedure verified.    Prior to the induction of general anesthesia, antibiotic prophylaxis was administered. VTE prophylaxis was in place.  General anesthesia was then administered and tolerated well. After the induction, the patient was positioned in the lithotomy position and the perineum/perianal area was prepped with Betadine and draped in the sterile fashion.  A Time Out was held and the above information confirmed.  Digital examination was then performed, confirming the previously appreciated density of  approximately 1 to 1.5 cm in size on the patient's left deep to the posterior most aspect of the left perineal scar.  I then attempted with an 18-gauge to aspirate, contents if any, of this lesion.  It was not fluctuant, although I am certain I penetrated the lesion I did not obtain any significant return in the syringe.  There were a few drops of blood on the attempt, and this was sent as specimen for culture and sensitivity.  I was also able to identify what seem like a bandlike scar connecting toward the perineal body in the midline.  There is no clear evidence of any communication with either the vestibule anteriorly or the rectum posteriorly.  I placed the endoscope within the distal rectum and did not appreciate any evidence of any communication, inflammation, induration, scar etc.  There is no appreciable abscess to my exam. I then infiltrated the area of pain/tenderness with a mixture of quarter percent Marcaine with epinephrine and Exparel, a total of 30 mL was utilized to block the region.  This completed the procedure.  Plan is to follow-up with MRI evaluation of the lower pelvic structures.  Continue p.o. antibiotics, with presumption this is an early recurrent abscess.        Campbell Lerner M.D., [facility] 07/19/2023 2:24 PM

## 2023-07-19 NOTE — Anesthesia Preprocedure Evaluation (Signed)
Anesthesia Evaluation  Patient identified by MRN, date of birth, ID band Patient awake    Reviewed: Allergy & Precautions, NPO status , Patient's Chart, lab work & pertinent test results  History of Anesthesia Complications (+) PONV and history of anesthetic complications  Airway Mallampati: II  TM Distance: >3 FB Neck ROM: full    Dental  (+) Chipped, Dental Advidsory Given   Pulmonary asthma    Pulmonary exam normal        Cardiovascular negative cardio ROS Normal cardiovascular exam     Neuro/Psych  PSYCHIATRIC DISORDERS      negative neurological ROS     GI/Hepatic negative GI ROS, Neg liver ROS,,,  Endo/Other  negative endocrine ROS    Renal/GU      Musculoskeletal   Abdominal   Peds  Hematology negative hematology ROS (+)   Anesthesia Other Findings Past Medical History: No date: Asthma No date: Complication of anesthesia     Comment:  [place] and Vomiting No date: PVC's (premature ventricular contractions)  Past Surgical History: No date: BREAST REDUCTION SURGERY No date: BREAST SURGERY 2018: CESAREAN SECTION 06/16/2019: COLONOSCOPY WITH PROPOFOL; N/A     Comment:  Procedure: COLONOSCOPY WITH PROPOFOL;  Surgeon:               Pasty Spillers, MD;  Location: [facility] ENDOSCOPY;                Service: Endoscopy;  Laterality: N/A; 2015: LEEP No date: REDUCTION MAMMAPLASTY No date: TONSILLECTOMY 1999: TONSILLECTOMY AND ADENOIDECTOMY 2009: WISDOM TOOTH EXTRACTION  BMI    Body Mass Index: 30.73 kg/m      Reproductive/Obstetrics negative OB ROS                             Anesthesia Physical Anesthesia Plan  ASA: 2  Anesthesia Plan: General   Post-op Pain Management:    Induction: Intravenous  PONV Risk Score and Plan: 4 or greater and Propofol infusion, TIVA, Midazolam, Ondansetron and Dexamethasone  Airway Management Planned: LMA  Additional Equipment:    Intra-op Plan:   Post-operative Plan: Extubation in OR  Informed Consent: I have reviewed the patients History and Physical, chart, labs and discussed the procedure including the risks, benefits and alternatives for the proposed anesthesia with the patient or authorized representative who has indicated his/her understanding and acceptance.     Dental Advisory Given  Plan Discussed with: Anesthesiologist, CRNA and Surgeon  Anesthesia Plan Comments: (Patient consented for risks of anesthesia including but not limited to:  - adverse reactions to medications - damage to eyes, teeth, lips or other oral mucosa - nerve damage due to positioning  - sore throat or hoarseness - Damage to heart, brain, nerves, lungs, other parts of body or loss of life  Patient voiced understanding and assent.)       Anesthesia Quick Evaluation

## 2023-07-19 NOTE — Anesthesia Procedure Notes (Signed)
Procedure Name: LMA Insertion Date/Time: 07/19/2023 1:41 PM  Performed by: Maryla Morrow., CRNAPre-anesthesia Checklist: Patient identified, Patient being monitored, Timeout performed, Emergency Drugs available and Suction available Patient Re-evaluated:Patient Re-evaluated prior to induction Oxygen Delivery Method: Circle system utilized Preoxygenation: Pre-oxygenation with 100% oxygen Induction Type: IV induction Ventilation: Mask ventilation without difficulty LMA: LMA inserted LMA Size: 4.0 Tube type: Oral Number of attempts: 1 Placement Confirmation: positive ETCO2 and breath sounds checked- equal and bilateral Tube secured with: Tape Dental Injury: Teeth and Oropharynx as per pre-operative assessment

## 2023-07-19 NOTE — Transfer of Care (Signed)
Immediate Anesthesia Transfer of Care Note  Patient: Melanie Valdez  Procedure(s) Performed: EXAM UNDER ANESTHESIA  Patient Location: PACU  Anesthesia Type:General  Level of Consciousness: drowsy and patient cooperative  Airway & Oxygen Therapy: Patient Spontanous Breathing  Post-op Assessment: Report given to RN and Post -op Vital signs reviewed and stable  Post vital signs: stable  Last Vitals:  Vitals Value Taken Time  BP 96/68 07/19/23 1418  Temp    Pulse 80 07/19/23 1419  Resp 13 07/19/23 1419  SpO2 96 % 07/19/23 1419  Vitals shown include unfiled device data.  Last Pain:  Vitals:   07/19/23 1023  TempSrc: Oral  PainSc: 3          Complications: No notable events documented.

## 2023-07-22 ENCOUNTER — Telehealth: Payer: Self-pay

## 2023-07-22 NOTE — Telephone Encounter (Signed)
Called patient and notified of MRI. The patient is scheduled for an MRI of the pelvis at [facility] on 07/29/23. [facility] at 10:30 am.

## 2023-07-23 ENCOUNTER — Encounter: Payer: Self-pay | Admitting: Family Medicine

## 2023-07-24 LAB — AEROBIC/ANAEROBIC CULTURE W GRAM STAIN (SURGICAL/DEEP WOUND)

## 2023-07-25 ENCOUNTER — Encounter: Payer: Self-pay | Admitting: Neurosurgery

## 2023-07-25 ENCOUNTER — Encounter: Payer: Self-pay | Admitting: Surgery

## 2023-07-26 ENCOUNTER — Encounter: Payer: Self-pay | Admitting: Family Medicine

## 2023-07-29 ENCOUNTER — Ambulatory Visit
Admission: RE | Admit: 2023-07-29 | Discharge: 2023-07-29 | Disposition: A | Discharge status: Home / Self Care | Payer: 59 | Source: Ambulatory Visit | Attending: Surgery | Admitting: Surgery

## 2023-07-29 DIAGNOSIS — N360 Urethral fistula: Secondary | ICD-10-CM | POA: Diagnosis present

## 2023-07-29 MED ORDER — GADOBUTROL 1 MMOL/ML IV SOLN
7.0000 mL | Freq: Once | INTRAVENOUS | Status: AC | PRN
  Administered 2023-07-29: 7 mL via INTRAVENOUS

## 2023-07-31 ENCOUNTER — Other Ambulatory Visit: Payer: Self-pay | Admitting: Family Medicine

## 2023-08-01 MED ORDER — AMPHETAMINE-DEXTROAMPHET ER 10 MG PO CP24: 10.0000 mg | ORAL_CAPSULE | Freq: Every day | ORAL | 0 refills | Status: DC

## 2023-08-01 NOTE — Anesthesia Postprocedure Evaluation (Signed)
Anesthesia Post Note  Patient: Melanie Valdez  Procedure(s) Performed: EXAM UNDER ANESTHESIA  Patient location during evaluation: PACU Anesthesia Type: General Level of consciousness: awake and alert Pain management: pain level controlled Vital Signs Assessment: post-procedure vital signs reviewed and stable Respiratory status: spontaneous breathing, nonlabored ventilation, respiratory function stable and patient connected to nasal cannula oxygen Cardiovascular status: blood pressure returned to baseline and stable Postop Assessment: no apparent nausea or vomiting Anesthetic complications: no   There were no known notable events for this encounter.   Last Vitals:  Vitals:   07/19/23 1500 07/19/23 1517  BP: 118/79 129/86  Pulse: 80 73  Resp: 16 16  Temp: (!) 36.2 C 36.8 C  SpO2: 100% 100%    Last Pain:  Vitals:   07/19/23 1517  TempSrc: Temporal  PainSc: 0-No pain                 Lenard Simmer

## 2023-08-13 ENCOUNTER — Other Ambulatory Visit: Payer: Self-pay

## 2023-08-13 ENCOUNTER — Other Ambulatory Visit: Payer: Self-pay | Admitting: Family Medicine

## 2023-08-13 DIAGNOSIS — Z8632 Personal history of gestational diabetes: Secondary | ICD-10-CM

## 2023-08-14 LAB — HEPATIC FUNCTION PANEL
ALT: 20 [IU]/L (ref 0–32)
AST: 17 [IU]/L (ref 0–40)
Albumin: 4.5 g/dL (ref 3.9–4.9)
Alkaline Phosphatase: 96 [IU]/L (ref 44–121)
Bilirubin Total: 1.4 mg/dL — ABNORMAL HIGH (ref 0.0–1.2)
Bilirubin, Direct: 0.31 mg/dL (ref 0.00–0.40)
Total Protein: 7.2 g/dL (ref 6.0–8.5)

## 2023-08-14 LAB — HEMOGLOBIN A1C
Est. average glucose Bld gHb Est-mCnc: 105 mg/dL
Hgb A1c MFr Bld: 5.3 % (ref 4.8–5.6)

## 2023-08-27 ENCOUNTER — Encounter: Payer: Self-pay | Admitting: Neurosurgery

## 2023-08-29 ENCOUNTER — Encounter: Payer: Self-pay | Admitting: Family Medicine

## 2023-08-29 ENCOUNTER — Telehealth: Payer: 59 | Admitting: Family Medicine

## 2023-08-29 DIAGNOSIS — F909 Attention-deficit hyperactivity disorder, unspecified type: Secondary | ICD-10-CM | POA: Diagnosis not present

## 2023-08-29 MED ORDER — AMPHETAMINE-DEXTROAMPHET ER 15 MG PO CP24: 15.0000 mg | ORAL_CAPSULE | Freq: Every day | ORAL | 0 refills | Status: DC

## 2023-08-29 MED ORDER — AMPHETAMINE-DEXTROAMPHET ER 15 MG PO CP24: 15.0000 mg | ORAL_CAPSULE | ORAL | 0 refills | Status: DC

## 2023-08-29 NOTE — Assessment & Plan Note (Signed)
Attention-Deficit/Hyperactivity Disorder (ADHD) ADHD follow-up. Currently on Adderall XR 10 mg daily with variable effectiveness. No side effects such as palpitations or anxiety. Previously on Strattera at maximum dose but discontinued due to pregnancy and insurance issues. Discussed increasing Adderall XR to 15 mg for improved coverage. Patient agreed to trial the higher dose for three months. - Increase Adderall XR to 15 mg daily - Send three prescriptions for Adderall XR 15 mg to [facility] - Follow up in three months to assess effectiveness and side effects

## 2023-08-29 NOTE — Progress Notes (Signed)
MyChart Video Visit    Virtual Visit via Video Note   This format is felt to be most appropriate for this patient at this time. Physical exam was limited by quality of the video and audio technology used for the visit.    Patient location: home Provider location: [facility] Persons involved in the visit: patient, provider  I discussed the limitations of evaluation and management by telemedicine and the availability of in person appointments. The patient expressed understanding and agreed to proceed.  Patient: Melanie Valdez   DOB: [birth year]   33 y.o. Female  MRN: [id number] Visit Date: 08/29/2023  Today's healthcare provider: Shirlee Latch, MD   Chief Complaint  Patient presents with   ADHD   Subjective    HPI   Discussed the use of AI scribe software for clinical note transcription with the patient, who gave verbal consent to proceed.  History of Present Illness   A 33 year old patient with a history of ADHD presents for a follow-up visit. The patient is currently on Adderall XR 10mg  daily, having previously been on Strattera and Adderall XR. The patient had discontinued medications but restarted due to feeling overwhelmed. The patient reports variable effectiveness of the Adderall XR, noting that the timing of the dose significantly impacts its efficacy. If taken early in the morning, the patient reports a noticeable decrease in effectiveness by mid-afternoon to early evening. However, if taken later in the morning, the patient reports sustained effectiveness until bedtime. The patient had previously been on the maximum dose of Strattera but had to discontinue due to pregnancy. The patient also mentions being worked up for [employer] disease.       Review of Systems      Objective    There were no vitals taken for this visit.      Physical Exam Constitutional:      General: She is not in acute distress.    Appearance: Normal appearance.   HENT:     Head: Normocephalic.  Pulmonary:     Effort: Pulmonary effort is normal. No respiratory distress.  Neurological:     Mental Status: She is alert and oriented to person, place, and time. Mental status is at baseline.        Assessment & Plan     Problem List Items Addressed This Visit       Other   ADHD - Primary   Attention-Deficit/Hyperactivity Disorder (ADHD) ADHD follow-up. Currently on Adderall XR 10 mg daily with variable effectiveness. No side effects such as palpitations or anxiety. Previously on Strattera at maximum dose but discontinued due to pregnancy and insurance issues. Discussed increasing Adderall XR to 15 mg for improved coverage. Patient agreed to trial the higher dose for three months. - Increase Adderall XR to 15 mg daily - Send three prescriptions for Adderall XR 15 mg to [facility] - Follow up in three months to assess effectiveness and side effects          Crohn's Disease (under evaluation) Undergoing evaluation for Crohn's disease.  - Proceed with scheduled colonoscopy - Monitor results and follow up with GI specialist.         Meds ordered this encounter  Medications   amphetamine-dextroamphetamine (ADDERALL XR) 15 MG 24 hr capsule    Sig: Take 1 capsule by mouth daily.    Dispense:  30 capsule    Refill:  0    Do not fill <30 days from last refill   amphetamine-dextroamphetamine (ADDERALL  XR) 15 MG 24 hr capsule    Sig: Take 1 capsule by mouth every morning.    Dispense:  30 capsule    Refill:  0    Do not fill <30 days from last refill   amphetamine-dextroamphetamine (ADDERALL XR) 15 MG 24 hr capsule    Sig: Take 1 capsule by mouth every morning.    Dispense:  30 capsule    Refill:  0    Do not fill <30 days from last refill     Return in about 3 months (around 11/27/2023) for ADD f/u, virtual ok.     I discussed the assessment and treatment plan with the patient. The patient was provided an opportunity to ask  questions and all were answered. The patient agreed with the plan and demonstrated an understanding of the instructions.   The patient was advised to call back or seek an in-person evaluation if the symptoms worsen or if the condition fails to improve as anticipated.   Shirlee Latch, MD [facility] [phone] (phone) [phone] (fax)  [facility]

## 2023-09-03 ENCOUNTER — Encounter
Admission: RE | Disposition: A | Discharge status: Home / Self Care | Payer: Self-pay | Source: Home / Self Care | Attending: Gastroenterology

## 2023-09-03 ENCOUNTER — Ambulatory Visit
Admission: RE | Admit: 2023-09-03 | Discharge: 2023-09-03 | Disposition: A | Discharge status: Home / Self Care | Payer: 59 | Attending: Gastroenterology | Admitting: Gastroenterology

## 2023-09-03 ENCOUNTER — Ambulatory Visit: Payer: 59 | Admitting: Anesthesiology

## 2023-09-03 ENCOUNTER — Encounter: Payer: Self-pay | Admitting: *Deleted

## 2023-09-03 ENCOUNTER — Other Ambulatory Visit: Payer: Self-pay

## 2023-09-03 DIAGNOSIS — K605 Anorectal fistula, unspecified: Secondary | ICD-10-CM | POA: Diagnosis present

## 2023-09-03 DIAGNOSIS — K5730 Diverticulosis of large intestine without perforation or abscess without bleeding: Secondary | ICD-10-CM | POA: Insufficient documentation

## 2023-09-03 DIAGNOSIS — J45909 Unspecified asthma, uncomplicated: Secondary | ICD-10-CM | POA: Insufficient documentation

## 2023-09-03 DIAGNOSIS — K648 Other hemorrhoids: Secondary | ICD-10-CM | POA: Insufficient documentation

## 2023-09-03 DIAGNOSIS — K640 First degree hemorrhoids: Secondary | ICD-10-CM | POA: Diagnosis not present

## 2023-09-03 HISTORY — PX: COLONOSCOPY WITH PROPOFOL: SHX5780

## 2023-09-03 HISTORY — PX: BIOPSY: SHX5522

## 2023-09-03 SURGERY — COLONOSCOPY WITH PROPOFOL
Anesthesia: General

## 2023-09-03 MED ORDER — LIDOCAINE HCL (CARDIAC) PF 100 MG/5ML IV SOSY
PREFILLED_SYRINGE | INTRAVENOUS | Status: DC | PRN
  Administered 2023-09-03: 60 mg via INTRAVENOUS

## 2023-09-03 MED ORDER — SODIUM CHLORIDE 0.9 % IV SOLN: INTRAVENOUS | Status: DC

## 2023-09-03 MED ORDER — DEXMEDETOMIDINE HCL IN NACL 80 MCG/20ML IV SOLN
INTRAVENOUS | Status: DC | PRN
  Administered 2023-09-03: 20 ug via INTRAVENOUS

## 2023-09-03 MED ORDER — PROPOFOL 500 MG/50ML IV EMUL
INTRAVENOUS | Status: DC | PRN
  Administered 2023-09-03: 125 ug/kg/min via INTRAVENOUS

## 2023-09-03 MED ORDER — LIDOCAINE HCL (PF) 2 % IJ SOLN
INTRAMUSCULAR | Status: AC
  Filled 2023-09-03: qty 5

## 2023-09-03 MED ORDER — PROPOFOL 1000 MG/100ML IV EMUL
INTRAVENOUS | Status: AC
  Filled 2023-09-03: qty 100

## 2023-09-03 MED ORDER — PROPOFOL 10 MG/ML IV BOLUS
INTRAVENOUS | Status: DC | PRN
  Administered 2023-09-03 (×2): 50 mg via INTRAVENOUS

## 2023-09-03 NOTE — H&P (Signed)
Outpatient short stay form Pre-procedure 09/03/2023  Regis Bill, MD  Primary Physician: Erasmo Downer, MD  Reason for visit:  Rectal Fistula  History of present illness:    33 y/o lady with history of asthma here for colonoscopy due to history of perianal fistula and concern for crohns. Last colonoscopy in 2020 that was unremarkable. No blood thinners. History of c-section. No family history of IBD.    Current Facility-Administered Medications:    0.9 %  sodium chloride infusion, , Intravenous, Continuous, Laquanda Bick, Rossie Muskrat, MD, Last Rate: 20 mL/hr at 09/03/23 1200, New Bag at 09/03/23 1200  Medications Prior to Admission  Medication Sig Dispense Refill Last Dose/Taking   amphetamine-dextroamphetamine (ADDERALL XR) 15 MG 24 hr capsule Take 1 capsule by mouth daily. 30 capsule 0 09/02/2023   amphetamine-dextroamphetamine (ADDERALL XR) 15 MG 24 hr capsule Take 1 capsule by mouth every morning. 30 capsule 0 09/02/2023   amphetamine-dextroamphetamine (ADDERALL XR) 15 MG 24 hr capsule Take 1 capsule by mouth every morning. 30 capsule 0 09/02/2023   cetirizine (ZYRTEC) 10 MG tablet Take 10 mg by mouth at bedtime.   09/02/2023   fexofenadine (ALLEGRA) 180 MG tablet Take 180 mg by mouth in the morning.   09/02/2023   gabapentin (NEURONTIN) 300 MG capsule Take 300 mg by mouth at bedtime.   09/02/2023   montelukast (SINGULAIR) 10 MG tablet TAKE 1 TABLET BY MOUTH EVERYDAY AT BEDTIME 30 tablet 2 09/02/2023   norethindrone (MICRONOR) 0.35 MG tablet Take 1 tablet by mouth every evening.   09/02/2023   ibuprofen (ADVIL) 800 MG tablet Take 1 tablet (800 mg total) by mouth every 8 (eight) hours as needed. 30 tablet 0    loperamide (IMODIUM) 2 MG capsule Take 2-4 mg by mouth 4 (four) times daily as needed for diarrhea or loose stools.      oxyCODONE (OXY IR/ROXICODONE) 5 MG immediate release tablet Take 1 tablet (5 mg total) by mouth every 6 (six) hours as needed for severe pain (pain score 7-10).  (Patient not taking: Reported on 09/03/2023) 15 tablet 0 Completed Course   triamcinolone (KENALOG) 0.025 % cream Apply 1 Application topically.        Allergies  Allergen Reactions   Sulfa Antibiotics Anaphylaxis and Itching   Codeine Nausea And Vomiting    (PATIENT DENIES ALLERGY)   Hydrocodone-Acetaminophen Itching    mild rash/itching (PATIENT DENIES ALLERGY)     Past Medical History:  Diagnosis Date   Asthma    Complication of anesthesia    [place] and Vomiting   PVC's (premature ventricular contractions)     Review of systems:  Otherwise negative.    Physical Exam  Gen: Alert, oriented. Appears stated age.  HEENT: PERRLA. Lungs: No respiratory distress CV: RRR Abd: soft, benign, no masses Ext: No edema    Planned procedures: Proceed with colonoscopy. The patient understands the nature of the planned procedure, indications, risks, alternatives and potential complications including but not limited to bleeding, infection, perforation, damage to internal organs and possible oversedation/side effects from anesthesia. The patient agrees and gives consent to proceed.  Please refer to procedure notes for findings, recommendations and patient disposition/instructions.     Regis Bill, MD [facility]

## 2023-09-03 NOTE — Anesthesia Preprocedure Evaluation (Signed)
Anesthesia Evaluation  Patient identified by MRN, date of birth, ID band Patient awake    Reviewed: Allergy & Precautions, NPO status , Patient's Chart, lab work & pertinent test results  History of Anesthesia Complications (+) history of anesthetic complications  Airway Mallampati: II  TM Distance: >3 FB Neck ROM: full    Dental no notable dental hx.    Pulmonary asthma    Pulmonary exam normal        Cardiovascular negative cardio ROS Normal cardiovascular exam     Neuro/Psych  Headaches  Neuromuscular disease  negative psych ROS   GI/Hepatic negative GI ROS, Neg liver ROS,,,  Endo/Other  negative endocrine ROS    Renal/GU      Musculoskeletal   Abdominal   Peds  Hematology negative hematology ROS (+)   Anesthesia Other Findings Past Medical History: No date: Asthma No date: Complication of anesthesia     Comment:  [place] and Vomiting No date: PVC's (premature ventricular contractions)  Past Surgical History: No date: BREAST REDUCTION SURGERY No date: BREAST SURGERY 2018: CESAREAN SECTION 06/16/2019: COLONOSCOPY WITH PROPOFOL; N/A     Comment:  Procedure: COLONOSCOPY WITH PROPOFOL;  Surgeon:               Pasty Spillers, MD;  Location: [facility] ENDOSCOPY;                Service: Endoscopy;  Laterality: N/A; 2015: LEEP No date: REDUCTION MAMMAPLASTY No date: TONSILLECTOMY 1999: TONSILLECTOMY AND ADENOIDECTOMY 2009: WISDOM TOOTH EXTRACTION  BMI    Body Mass Index: 30.00 kg/m      Reproductive/Obstetrics negative OB ROS                              Anesthesia Physical Anesthesia Plan  ASA: 2  Anesthesia Plan: General ETT   Post-op Pain Management: Minimal or no pain anticipated   Induction: Intravenous  PONV Risk Score and Plan: 2 and Treatment may vary due to age or medical condition, Propofol infusion and TIVA  Airway Management Planned: Natural Airway and  Nasal Cannula  Additional Equipment:   Intra-op Plan:   Post-operative Plan:   Informed Consent: I have reviewed the patients History and Physical, chart, labs and discussed the procedure including the risks, benefits and alternatives for the proposed anesthesia with the patient or authorized representative who has indicated his/her understanding and acceptance.     Dental Advisory Given  Plan Discussed with: Anesthesiologist, CRNA and Surgeon  Anesthesia Plan Comments: (Patient consented for risks of anesthesia including but not limited to:  - adverse reactions to medications - risk of airway placement if required - damage to eyes, teeth, lips or other oral mucosa - nerve damage due to positioning  - sore throat or hoarseness - Damage to heart, brain, nerves, lungs, other parts of body or loss of life  Patient voiced understanding and assent.)         Anesthesia Quick Evaluation

## 2023-09-03 NOTE — Op Note (Signed)
[facility] Patient Name: Melanie Valdez Procedure Date: 09/03/2023 12:48 PM MRN: [id number] Account #: [id number] Date of Birth: [birth year] Admit Type: Outpatient Age: 33 Room: [facility] ENDO ROOM 1 Gender: Female Note Status: Finalized Instrument Name: Colonoscope [id number] Procedure:             Colonoscopy Indications:           Anorectal fistula Providers:             Eather Colas MD, MD Referring MD:          Marzella Schlein. Bacigalupo (Referring MD) Medicines:             Monitored Anesthesia Care Complications:         No immediate complications. Estimated blood loss:                         Minimal. Procedure:             Pre-Anesthesia Assessment:                        - Prior to the procedure, a History and Physical was                         performed, and patient medications and allergies were                         reviewed. The patient is competent. The risks and                         benefits of the procedure and the sedation options and                         risks were discussed with the patient. All questions                         were answered and informed consent was obtained.                         Patient identification and proposed procedure were                         verified by the physician, the nurse, the                         anesthesiologist, the anesthetist and the technician                         in the endoscopy suite. Mental Status Examination:                         alert and oriented. Airway Examination: normal                         oropharyngeal airway and neck mobility. Respiratory                         Examination: clear to auscultation. CV Examination:  normal. Prophylactic Antibiotics: The patient does not                         require prophylactic antibiotics. Prior                         Anticoagulants: The patient has taken no anticoagulant                         or  antiplatelet agents. ASA Grade Assessment: II - A                         patient with mild systemic disease. After reviewing                         the risks and benefits, the patient was deemed in                         satisfactory condition to undergo the procedure. The                         anesthesia plan was to use monitored anesthesia care                         (MAC). Immediately prior to administration of                         medications, the patient was re-assessed for adequacy                         to receive sedatives. The heart rate, respiratory                         rate, oxygen saturations, blood pressure, adequacy of                         pulmonary ventilation, and response to care were                         monitored throughout the procedure. The physical                         status of the patient was re-assessed after the                         procedure.                        After obtaining informed consent, the colonoscope was                         passed under direct vision. Throughout the procedure,                         the patient's blood pressure, pulse, and oxygen                         saturations were monitored continuously. The  Colonoscope was introduced through the anus and                         advanced to the the terminal ileum, with                         identification of the appendiceal orifice and IC                         valve. The colonoscopy was performed without                         difficulty. The patient tolerated the procedure well.                         The quality of the bowel preparation was good. The                         terminal ileum, ileocecal valve, appendiceal orifice,                         and rectum were photographed. Findings:      The perianal and digital rectal examinations were normal.      The terminal ileum appeared normal. Biopsies were taken with a cold        forceps for histology. Estimated blood loss was minimal.      Normal mucosa was found in the entire colon. Biopsies were taken with a       cold forceps for histology. Estimated blood loss was minimal.      A single small-mouthed diverticulum was found in the sigmoid colon.      Internal hemorrhoids were found during retroflexion. The hemorrhoids       were Grade I (internal hemorrhoids that do not prolapse).      Anal papilla(e) were hypertrophied.      The exam was otherwise without abnormality on direct and retroflexion       views. Impression:            - The examined portion of the ileum was normal.                         Biopsied.                        - Normal mucosa in the entire examined colon. Biopsied.                        - Diverticulosis in the sigmoid colon.                        - Internal hemorrhoids.                        - Anal papilla(e) were hypertrophied.                        - The examination was otherwise normal on direct and                         retroflexion views. Recommendation:        -  Discharge patient to home.                        - Resume previous diet.                        - Continue present medications.                        - Await pathology results.                        - Repeat colonoscopy at age 4 for screening purposes.                        - Return to referring physician as previously                         scheduled. Procedure Code(s):     --- Professional ---                        [phone], Colonoscopy, flexible; with biopsy, single or                         multiple Diagnosis Code(s):     --- Professional ---                        K64.0, First degree hemorrhoids                        K62.89, Other specified diseases of anus and rectum                        K60.5, Anorectal fistula                        K57.30, Diverticulosis of large intestine without                         perforation or abscess without  bleeding CPT copyright 2022 American Medical Association. All rights reserved. The codes documented in this report are preliminary and upon coder review may  be revised to meet current compliance requirements. Eather Colas MD, MD 09/03/2023 1:14:23 PM Number of Addenda: 0 Note Initiated On: 09/03/2023 12:48 PM Scope Withdrawal Time: 0 hours 11 minutes 19 seconds  Total Procedure Duration: 0 hours 15 minutes 56 seconds  Estimated Blood Loss:  Estimated blood loss was minimal.      [facility]

## 2023-09-03 NOTE — OR Nursing (Signed)
Urine pregnancy test negative

## 2023-09-03 NOTE — Transfer of Care (Signed)
Immediate Anesthesia Transfer of Care Note  Patient: Melanie Valdez  Procedure(s) Performed: COLONOSCOPY WITH PROPOFOL BIOPSY  Patient Location: PACU  Anesthesia Type:General  Level of Consciousness: sedated  Airway & Oxygen Therapy: Patient Spontanous Breathing  Post-op Assessment: Report given to RN and Post -op Vital signs reviewed and stable  Post vital signs: Reviewed and stable  Last Vitals:  Vitals Value Taken Time  BP 97/60 09/03/23 1316  Temp 36.3 C 09/03/23 1314  Pulse 73 09/03/23 1316  Resp 18 09/03/23 1316  SpO2 97 % 09/03/23 1316  Vitals shown include unfiled device data.  Last Pain:  Vitals:   09/03/23 1314  TempSrc: Tympanic  PainSc: Asleep         Complications: No notable events documented.

## 2023-09-03 NOTE — Interval H&P Note (Signed)
History and Physical Interval Note:  09/03/2023 12:40 PM  Melanie Valdez  has presented today for surgery, with the diagnosis of perianal fistula.  The various methods of treatment have been discussed with the patient and family. After consideration of risks, benefits and other options for treatment, the patient has consented to  Procedure(s): COLONOSCOPY WITH PROPOFOL (N/A) as a surgical intervention.  The patient's history has been reviewed, patient examined, no change in status, stable for surgery.  I have reviewed the patient's chart and labs.  Questions were answered to the patient's satisfaction.     Regis Bill  Ok to proceed with colonoscopy

## 2023-09-03 NOTE — Anesthesia Postprocedure Evaluation (Signed)
Anesthesia Post Note  Patient: Melanie Valdez  Procedure(s) Performed: COLONOSCOPY WITH PROPOFOL BIOPSY  Patient location during evaluation: PACU Anesthesia Type: General Level of consciousness: awake and alert, oriented and patient cooperative Pain management: pain level controlled Vital Signs Assessment: post-procedure vital signs reviewed and stable Respiratory status: spontaneous breathing, nonlabored ventilation and respiratory function stable Cardiovascular status: blood pressure returned to baseline and stable Postop Assessment: adequate PO intake Anesthetic complications: no   No notable events documented.   Last Vitals:  Vitals:   09/03/23 1324 09/03/23 1334  BP: (!) 91/56 101/69  Pulse: 78 76  Resp: 19 15  Temp:    SpO2: 100% 100%    Last Pain:  Vitals:   09/03/23 1334  TempSrc:   PainSc: 0-No pain                 Reed Breech

## 2023-09-04 ENCOUNTER — Other Ambulatory Visit: Payer: Self-pay | Admitting: Gastroenterology

## 2023-09-04 ENCOUNTER — Encounter: Payer: Self-pay | Admitting: Gastroenterology

## 2023-09-04 DIAGNOSIS — K603 Anal fistula, unspecified: Secondary | ICD-10-CM

## 2023-09-04 DIAGNOSIS — R935 Abnormal findings on diagnostic imaging of other abdominal regions, including retroperitoneum: Secondary | ICD-10-CM

## 2023-09-04 DIAGNOSIS — K529 Noninfective gastroenteritis and colitis, unspecified: Secondary | ICD-10-CM

## 2023-09-04 LAB — SURGICAL PATHOLOGY

## 2023-09-05 ENCOUNTER — Telehealth: Payer: Self-pay | Admitting: Family Medicine

## 2023-09-05 MED ORDER — MONTELUKAST SODIUM 10 MG PO TABS: ORAL_TABLET | ORAL | 2 refills | Status: DC

## 2023-09-05 NOTE — Telephone Encounter (Signed)
[facility] faxed refill request for the following medications:  montelukast (SINGULAIR) 10 MG tablet  Please advise. 

## 2023-09-06 ENCOUNTER — Ambulatory Visit: Payer: 59 | Admitting: Neurosurgery

## 2023-09-06 VITALS — BP 124/74 | Ht 62.0 in | Wt 164.0 lb

## 2023-09-06 DIAGNOSIS — G5722 Lesion of femoral nerve, left lower limb: Secondary | ICD-10-CM

## 2023-09-06 DIAGNOSIS — G5702 Lesion of sciatic nerve, left lower limb: Secondary | ICD-10-CM

## 2023-09-06 DIAGNOSIS — R29898 Other symptoms and signs involving the musculoskeletal system: Secondary | ICD-10-CM

## 2023-09-06 NOTE — Progress Notes (Unsigned)
[facility] PT -> Eval PMR Follow up

## 2023-09-07 ENCOUNTER — Ambulatory Visit
Admission: RE | Admit: 2023-09-07 | Discharge: 2023-09-07 | Disposition: A | Discharge status: Home / Self Care | Payer: 59 | Source: Ambulatory Visit | Attending: Gastroenterology

## 2023-09-07 ENCOUNTER — Ambulatory Visit
Admission: RE | Admit: 2023-09-07 | Discharge: 2023-09-07 | Disposition: A | Discharge status: Home / Self Care | Payer: 59 | Source: Ambulatory Visit | Attending: Gastroenterology | Admitting: Gastroenterology

## 2023-09-07 DIAGNOSIS — R935 Abnormal findings on diagnostic imaging of other abdominal regions, including retroperitoneum: Secondary | ICD-10-CM | POA: Insufficient documentation

## 2023-09-07 DIAGNOSIS — K603 Anal fistula, unspecified: Secondary | ICD-10-CM | POA: Diagnosis present

## 2023-09-07 DIAGNOSIS — K529 Noninfective gastroenteritis and colitis, unspecified: Secondary | ICD-10-CM

## 2023-09-07 MED ORDER — GADOBUTROL 1 MMOL/ML IV SOLN
7.0000 mL | Freq: Once | INTRAVENOUS | Status: AC | PRN
  Administered 2023-09-07: 7 mL via INTRAVENOUS

## 2023-09-10 ENCOUNTER — Ambulatory Visit: Payer: Self-pay | Admitting: Surgery

## 2023-09-10 NOTE — H&P (Signed)
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 female who was referred by Self for evaluation of above. First noted several months ago.  Recurrent lump on left perineal aspect, recent drainage as well.  Having mult episode of diarrhea with initial workup negative by [facility]       Past Medical History:  has a past medical history of Allergic state, Anemia, Anxiety (2020), Asthma without status asthmaticus (HHS-HCC), Depression (2020), History of abnormal cervical Pap smear (05/2013), and Hypertension.   Past Surgical History:  has a past surgical history that includes Tonsillectomy; tubes in ears; Combined reduction mammaplasty w/ abdominoplasty; Cervical biopsy w/ loop electrode excision; Adenoidectomy; Breast surgery (2009); Examination Under Anesthesia Ear/Nose/Throat (tubes); Cesarean section (06/28/2017); and Cystectomy.   Family History: family history includes Anesthesia problems in her mother; Asthma in her maternal uncle; Breast cancer in her maternal grandmother; Cancer in her maternal grandmother; Clotting disorder in her sister; Deep vein thrombosis (DVT or abnormal blood clot formation) in her maternal grandmother; Diabetes in her maternal uncle; Heart disease in her maternal grandmother; High blood pressure (Hypertension) in her maternal grandmother and mother; Osteoporosis (Thinning of bones) in her maternal grandmother; Stroke in her maternal grandmother.   Social History:  reports that she has never smoked. She has never used smokeless tobacco. She reports that she does not currently use alcohol after a past usage of about 2.0 standard drinks of alcohol per week. She reports that she does not use drugs.   Current Medications: has a current medication list which includes the following prescription(s): albuterol mdi (proventil, ventolin, proair) hfa, cetirizine, dextroamphetamine-amphetamine, fexofenadine, fluticasone propionate,  gabapentin, ibuprofen, loperamide, montelukast, norethindrone, and doxycycline.   Allergies:  Allergies       Allergies  Allergen Reactions   Codeine Hives and Nausea And Vomiting      (PATIENT DENIES ALLERGY)   Hydrocodone-Acetaminophen Itching      mild rash/itching (PATIENT DENIES ALLERGY)   Sulfa (Sulfonamide Antibiotics) Nausea And Vomiting        ROS:  A 15 point review of systems was performed and pertinent positives and negatives noted in HPI   Objective:      Objective BP (!) 141/91   Pulse 93   Ht 157.5 cm (5' 2.01")   Wt 78.5 kg (173 lb 1 oz)   LMP 07/27/2023   BMI 31.65 kg/m    Constitutional :  No distress, cooperative, alert  Lymphatics/Throat:  Supple with no lymphadenopathy  Respiratory:  Clear to auscultation bilaterally  Cardiovascular:  Regular rate and rhythm  Gastrointestinal: Soft, non-tender, non-distended, no organomegaly.  Musculoskeletal: Steady gait and movement  Skin: Cool and moist  Psychiatric: Normal affect, non-agitated, not confused  Rectal: Chaperone present for exam. Perineal area with abscess like skin nodule on left perineal/thigh area as noted on MRI below.  No active drainage or erythema, but tender to palpation.      LABS:  N/a    RADS: CLINICAL DATA:  History of perianal abscess with I and D, recent  drainage of new abscess, evaluate for anal fissure or fistula   EXAM:  MRI PELVIS WITHOUT AND WITH CONTRAST   TECHNIQUE:  Multiplanar multisequence MR imaging of the pelvis was performed  both before and after administration of intravenous contrast.   CONTRAST:  7mL GADAVIST GADOBUTROL 1 MMOL/ML IV SOLN   COMPARISON:  None Available.   FINDINGS:  Urinary Tract:  No abnormality visualized.  Bowel: Mild mucosal thickening and hyperenhancement of the sigmoid  colon and rectum (series 9, image 16).   Vascular/Lymphatic: No pathologically enlarged lymph nodes. No  significant vascular abnormality seen.    Reproductive:  No mass or other significant abnormality   Other: Contrast enhancing intersphincteric fistula tract, which  appears to arise at 1 o'clock face in lithotomy position (series 6,  image 27) and insert to the skin of the left aspect of the perineum,  just posterior to the left labia (series 6, image 33, series 7,  image 22). Tract length approximately 4 cm. No associated abscess.   Musculoskeletal: No suspicious bone lesions identified.   IMPRESSION:  1. Intersphincteric anal fistula tract, which appears to arise at 1  o'clock face in lithotomy position and insert to the skin of the  left aspect of the perineum, just posterior to the left labia. Tract  length approximately 4 cm. No associated abscess.  2. Mild mucosal thickening and hyperenhancement of the sigmoid colon  and rectum, suggesting inflammatory bowel disease.    Electronically Signed    By: Jearld Lesch M.D.    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  Extent of fistula as well as the colitis noted on imaging is concerning that IBD as potential cause.  Will discuss surgical options once GI workup complete.  Referral to [facility] sent     labs/images/medications/previous chart entries reviewed personally and relevant changes/updates noted above.  Update: Called and discussed further options regarding her fistula after GI workup completed and diagnosed with IBS-D.  Will proceed with seton placement.  Discussed risks/benefits/alternatives to surgery.  Alternatives include the options of observation, medical management of her IBS-D.  Benefits include symptomatic relief.  I discussed  in detail and the complications related to the operation including poor/delayed wound healing, failure, and additional procedures to address said risks.  We also  discussed typical post operative recovery w The patient understands the risks, any and all questions were answered to the patient's satisfaction.

## 2023-09-10 NOTE — H&P (View-Only) (Signed)
 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 female who was referred by Self for evaluation of above. First noted several months ago.  Recurrent lump on left perineal aspect, recent drainage as well.  Having mult episode of diarrhea with initial workup negative by [facility]       Past Medical History:  has a past medical history of Allergic state, Anemia, Anxiety (2020), Asthma without status asthmaticus (HHS-HCC), Depression (2020), History of abnormal cervical Pap smear (05/2013), and Hypertension.   Past Surgical History:  has a past surgical history that includes Tonsillectomy; tubes in ears; Combined reduction mammaplasty w/ abdominoplasty; Cervical biopsy w/ loop electrode excision; Adenoidectomy; Breast surgery (2009); Examination Under Anesthesia Ear/Nose/Throat (tubes); Cesarean section (06/28/2017); and Cystectomy.   Family History: family history includes Anesthesia problems in her mother; Asthma in her maternal uncle; Breast cancer in her maternal grandmother; Cancer in her maternal grandmother; Clotting disorder in her sister; Deep vein thrombosis (DVT or abnormal blood clot formation) in her maternal grandmother; Diabetes in her maternal uncle; Heart disease in her maternal grandmother; High blood pressure (Hypertension) in her maternal grandmother and mother; Osteoporosis (Thinning of bones) in her maternal grandmother; Stroke in her maternal grandmother.   Social History:  reports that she has never smoked. She has never used smokeless tobacco. She reports that she does not currently use alcohol after a past usage of about 2.0 standard drinks of alcohol per week. She reports that she does not use drugs.   Current Medications: has a current medication list which includes the following prescription(s): albuterol mdi (proventil, ventolin, proair) hfa, cetirizine, dextroamphetamine-amphetamine, fexofenadine, fluticasone propionate,  gabapentin, ibuprofen, loperamide, montelukast, norethindrone, and doxycycline.   Allergies:  Allergies       Allergies  Allergen Reactions   Codeine Hives and Nausea And Vomiting      (PATIENT DENIES ALLERGY)   Hydrocodone-Acetaminophen Itching      mild rash/itching (PATIENT DENIES ALLERGY)   Sulfa (Sulfonamide Antibiotics) Nausea And Vomiting        ROS:  A 15 point review of systems was performed and pertinent positives and negatives noted in HPI   Objective:      Objective BP (!) 141/91   Pulse 93   Ht 157.5 cm (5' 2.01")   Wt 78.5 kg (173 lb 1 oz)   LMP 07/27/2023   BMI 31.65 kg/m    Constitutional :  No distress, cooperative, alert  Lymphatics/Throat:  Supple with no lymphadenopathy  Respiratory:  Clear to auscultation bilaterally  Cardiovascular:  Regular rate and rhythm  Gastrointestinal: Soft, non-tender, non-distended, no organomegaly.  Musculoskeletal: Steady gait and movement  Skin: Cool and moist  Psychiatric: Normal affect, non-agitated, not confused  Rectal: Chaperone present for exam. Perineal area with abscess like skin nodule on left perineal/thigh area as noted on MRI below.  No active drainage or erythema, but tender to palpation.      LABS:  N/a    RADS: CLINICAL DATA:  History of perianal abscess with I and D, recent  drainage of new abscess, evaluate for anal fissure or fistula   EXAM:  MRI PELVIS WITHOUT AND WITH CONTRAST   TECHNIQUE:  Multiplanar multisequence MR imaging of the pelvis was performed  both before and after administration of intravenous contrast.   CONTRAST:  7mL GADAVIST GADOBUTROL 1 MMOL/ML IV SOLN   COMPARISON:  None Available.   FINDINGS:  Urinary Tract:  No abnormality visualized.  Bowel: Mild mucosal thickening and hyperenhancement of the sigmoid  colon and rectum (series 9, image 16).   Vascular/Lymphatic: No pathologically enlarged lymph nodes. No  significant vascular abnormality seen.    Reproductive:  No mass or other significant abnormality   Other: Contrast enhancing intersphincteric fistula tract, which  appears to arise at 1 o'clock face in lithotomy position (series 6,  image 27) and insert to the skin of the left aspect of the perineum,  just posterior to the left labia (series 6, image 33, series 7,  image 22). Tract length approximately 4 cm. No associated abscess.   Musculoskeletal: No suspicious bone lesions identified.   IMPRESSION:  1. Intersphincteric anal fistula tract, which appears to arise at 1  o'clock face in lithotomy position and insert to the skin of the  left aspect of the perineum, just posterior to the left labia. Tract  length approximately 4 cm. No associated abscess.  2. Mild mucosal thickening and hyperenhancement of the sigmoid colon  and rectum, suggesting inflammatory bowel disease.    Electronically Signed    By: Jearld Lesch M.D.    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  Extent of fistula as well as the colitis noted on imaging is concerning that IBD as potential cause.  Will discuss surgical options once GI workup complete.  Referral to [facility] sent     labs/images/medications/previous chart entries reviewed personally and relevant changes/updates noted above.  Update: Called and discussed further options regarding her fistula after GI workup completed and diagnosed with IBS-D.  Will proceed with seton placement.  Discussed risks/benefits/alternatives to surgery.  Alternatives include the options of observation, medical management of her IBS-D.  Benefits include symptomatic relief.  I discussed  in detail and the complications related to the operation including poor/delayed wound healing, failure, and additional procedures to address said risks.  We also  discussed typical post operative recovery w The patient understands the risks, any and all questions were answered to the patient's satisfaction.

## 2023-09-13 ENCOUNTER — Other Ambulatory Visit: Payer: Self-pay | Admitting: Family Medicine

## 2023-09-17 ENCOUNTER — Encounter: Payer: Self-pay | Admitting: Neurosurgery

## 2023-09-17 ENCOUNTER — Other Ambulatory Visit (HOSPITAL_COMMUNITY): Payer: Self-pay

## 2023-09-17 DIAGNOSIS — G5702 Lesion of sciatic nerve, left lower limb: Secondary | ICD-10-CM

## 2023-09-17 MED ORDER — NORETHINDRONE 0.35 MG PO TABS
1.0000 | ORAL_TABLET | Freq: Every evening | ORAL | 11 refills | Status: DC
  Filled 2023-09-17: qty 28, 28d supply, fill #0

## 2023-09-19 ENCOUNTER — Other Ambulatory Visit (HOSPITAL_COMMUNITY): Payer: Self-pay

## 2023-09-19 NOTE — Telephone Encounter (Signed)
Patient is calling to follow up on her referral

## 2023-09-20 ENCOUNTER — Other Ambulatory Visit: Payer: Self-pay

## 2023-09-20 ENCOUNTER — Encounter: Payer: Self-pay | Admitting: Neurology

## 2023-09-20 DIAGNOSIS — R202 Paresthesia of skin: Secondary | ICD-10-CM

## 2023-09-20 DIAGNOSIS — R29898 Other symptoms and signs involving the musculoskeletal system: Secondary | ICD-10-CM

## 2023-09-20 DIAGNOSIS — G5722 Lesion of femoral nerve, left lower limb: Secondary | ICD-10-CM

## 2023-09-26 ENCOUNTER — Other Ambulatory Visit: Payer: Self-pay

## 2023-09-26 ENCOUNTER — Encounter
Admission: RE | Admit: 2023-09-26 | Discharge: 2023-09-26 | Disposition: A | Discharge status: Home / Self Care | Payer: 59 | Source: Ambulatory Visit | Attending: Surgery | Admitting: Surgery

## 2023-09-26 VITALS — Ht 62.0 in | Wt 160.0 lb

## 2023-09-26 DIAGNOSIS — Z01812 Encounter for preprocedural laboratory examination: Secondary | ICD-10-CM

## 2023-09-26 HISTORY — DX: Irritable bowel syndrome with diarrhea: K58.0

## 2023-09-26 HISTORY — DX: Family history of other specified conditions: Z84.89

## 2023-09-26 HISTORY — DX: Nausea with vomiting, unspecified: Z98.890

## 2023-09-26 HISTORY — DX: Anal fistula, unspecified: K60.30

## 2023-09-26 HISTORY — DX: Nausea with vomiting, unspecified: R11.2

## 2023-09-26 HISTORY — DX: Attention-deficit hyperactivity disorder, unspecified type: F90.9

## 2023-09-26 NOTE — Patient Instructions (Addendum)
Your procedure is scheduled on: Friday, February 21 Report to the Registration Desk on the 1st floor of the [facility] To find out your arrival time, please call [phone] between 1PM - 3PM on: Thursday, February 20 If your arrival time is 6:00 am, do not arrive before that time as the [facility] entrance doors do not open until 6:00 am.  REMEMBER: Instructions that are not followed completely may result in serious medical risk, up to and including death; or upon the discretion of your surgeon and anesthesiologist your surgery may need to be rescheduled.  Do not eat food after midnight the night before surgery.  No gum chewing or hard candies.  You may however, drink CLEAR liquids up to 2 hours before you are scheduled to arrive for your surgery. Do not drink anything within 2 hours of your scheduled arrival time.  Clear liquids include: - water  - apple juice without pulp - gatorade (not RED colors) - black coffee or tea (Do NOT add milk or creamers to the coffee or tea) Do NOT drink anything that is not on this list.  One week prior to surgery: starting February 14 Stop Anti-inflammatories (NSAIDS) such as Advil, Aleve, Ibuprofen, Motrin, Naproxen, Naprosyn and Aspirin based products such as Excedrin, Goody's Powder, BC Powder. Stop ANY OVER THE COUNTER supplements until after surgery.  You may however, continue to take Tylenol if needed for pain up until the day of surgery.  Continue taking all of your other prescription medications up until the day of surgery.  ON THE DAY OF SURGERY ONLY TAKE THESE MEDICATIONS:  Albuterol inhaler - bring to the hospital  No Alcohol for 24 hours before or after surgery.  No Smoking including e-cigarettes for 24 hours before surgery.  No chewable tobacco products for at least 6 hours before surgery.  No nicotine patches on the day of surgery.  Do not use any "recreational" drugs for at least a week (preferably 2 weeks) before your  surgery.  Please be advised that the combination of cocaine and anesthesia may have negative outcomes, up to and including death. If you test positive for cocaine, your surgery will be cancelled.  On the morning of surgery brush your teeth with toothpaste and water, you may rinse your mouth with mouthwash if you wish. Do not swallow any toothpaste or mouthwash.  Do not wear jewelry, make-up, hairpins, clips or nail polish.  For welded (permanent) jewelry: bracelets, anklets, waist bands, etc.  Please have this removed prior to surgery.  If it is not removed, there is a chance that hospital personnel will need to cut it off on the day of surgery.  Do not wear lotions, powders, or perfumes.   Do not shave body hair from the neck down 48 hours before surgery.  Contact lenses, hearing aids and dentures may not be worn into surgery.  Do not bring valuables to the hospital. [facility] is not responsible for any missing/lost belongings or valuables.   Notify your doctor if there is any change in your medical condition (cold, fever, infection).  Wear comfortable clothing (specific to your surgery type) to the hospital.  After surgery, you can help prevent lung complications by doing breathing exercises.  Take deep breaths and cough every 1-2 hours.   If you are being discharged the day of surgery, you will not be allowed to drive home. You will need a responsible individual to drive you home and stay with you for 24 hours after  surgery.   If you are taking public transportation, you will need to have a responsible individual with you.  Please call the Pre-admissions Testing Dept. at [phone] if you have any questions about these instructions.  Surgery Visitation Policy:  Patients having surgery or a procedure may have two visitors.  Children under the age of 48 must have an adult with them who is not the patient.  Temporary Visitor Restrictions Due to increasing cases of flu,  RSV and COVID-19: Children ages 12 and under will not be able to visit patients in [facility] hospitals under most circumstances.

## 2023-10-02 ENCOUNTER — Ambulatory Visit: Payer: 59 | Admitting: Neurosurgery

## 2023-10-04 ENCOUNTER — Encounter: Payer: Self-pay | Admitting: Surgery

## 2023-10-04 ENCOUNTER — Encounter
Admission: RE | Disposition: A | Discharge status: Home / Self Care | Payer: Self-pay | Source: Home / Self Care | Attending: Surgery

## 2023-10-04 ENCOUNTER — Other Ambulatory Visit: Payer: Self-pay

## 2023-10-04 ENCOUNTER — Ambulatory Visit
Admission: RE | Admit: 2023-10-04 | Discharge: 2023-10-04 | Disposition: A | Discharge status: Home / Self Care | Payer: 59 | Attending: Surgery | Admitting: Surgery

## 2023-10-04 ENCOUNTER — Ambulatory Visit: Payer: 59 | Admitting: Anesthesiology

## 2023-10-04 DIAGNOSIS — Z01812 Encounter for preprocedural laboratory examination: Secondary | ICD-10-CM

## 2023-10-04 DIAGNOSIS — J45909 Unspecified asthma, uncomplicated: Secondary | ICD-10-CM | POA: Diagnosis not present

## 2023-10-04 DIAGNOSIS — G709 Myoneural disorder, unspecified: Secondary | ICD-10-CM | POA: Diagnosis not present

## 2023-10-04 DIAGNOSIS — Z833 Family history of diabetes mellitus: Secondary | ICD-10-CM | POA: Insufficient documentation

## 2023-10-04 DIAGNOSIS — E119 Type 2 diabetes mellitus without complications: Secondary | ICD-10-CM | POA: Insufficient documentation

## 2023-10-04 DIAGNOSIS — K603 Anal fistula, unspecified: Secondary | ICD-10-CM | POA: Diagnosis present

## 2023-10-04 LAB — POCT PREGNANCY, URINE: Preg Test, Ur: NEGATIVE

## 2023-10-04 SURGERY — EXAM UNDER ANESTHESIA
Anesthesia: General | Site: Rectum

## 2023-10-04 MED ORDER — FENTANYL CITRATE (PF) 100 MCG/2ML IJ SOLN
INTRAMUSCULAR | Status: AC
  Filled 2023-10-04: qty 2

## 2023-10-04 MED ORDER — DOCUSATE SODIUM 100 MG PO CAPS: 100.0000 mg | ORAL_CAPSULE | Freq: Two times a day (BID) | ORAL | 0 refills | Status: AC | PRN

## 2023-10-04 MED ORDER — KETOROLAC TROMETHAMINE 30 MG/ML IJ SOLN
INTRAMUSCULAR | Status: DC | PRN
  Administered 2023-10-04: 15 mg via INTRAVENOUS

## 2023-10-04 MED ORDER — ONDANSETRON HCL 4 MG/2ML IJ SOLN
INTRAMUSCULAR | Status: AC
  Filled 2023-10-04: qty 2

## 2023-10-04 MED ORDER — DEXAMETHASONE SODIUM PHOSPHATE 10 MG/ML IJ SOLN
INTRAMUSCULAR | Status: DC | PRN
  Administered 2023-10-04: 5 mg via INTRAVENOUS

## 2023-10-04 MED ORDER — PROPOFOL 1000 MG/100ML IV EMUL
INTRAVENOUS | Status: AC
  Filled 2023-10-04: qty 100

## 2023-10-04 MED ORDER — GELATIN ABSORBABLE 12-7 MM EX MISC
CUTANEOUS | Status: AC
  Filled 2023-10-04: qty 1

## 2023-10-04 MED ORDER — FENTANYL CITRATE (PF) 100 MCG/2ML IJ SOLN: 25.0000 ug | INTRAMUSCULAR | Status: DC | PRN

## 2023-10-04 MED ORDER — CEFAZOLIN SODIUM-DEXTROSE 2-3 GM-%(50ML) IV SOLR
INTRAVENOUS | Status: DC | PRN
  Administered 2023-10-04: 2 g via INTRAVENOUS

## 2023-10-04 MED ORDER — CHLORHEXIDINE GLUCONATE 0.12 % MT SOLN
15.0000 mL | Freq: Once | OROMUCOSAL | Status: AC
  Administered 2023-10-04: 15 mL via OROMUCOSAL

## 2023-10-04 MED ORDER — ONDANSETRON HCL 4 MG/2ML IJ SOLN
INTRAMUSCULAR | Status: DC | PRN
  Administered 2023-10-04: 4 mg via INTRAVENOUS

## 2023-10-04 MED ORDER — OXYCODONE-ACETAMINOPHEN 5-325 MG PO TABS
1.0000 | ORAL_TABLET | Freq: Three times a day (TID) | ORAL | 0 refills | Status: DC | PRN
Start: 2023-10-04 — End: 2023-10-13

## 2023-10-04 MED ORDER — HYDROGEN PEROXIDE 3 % EX SOLN
CUTANEOUS | Status: DC | PRN
  Administered 2023-10-04: 1

## 2023-10-04 MED ORDER — OXYCODONE HCL 5 MG/5ML PO SOLN: 5.0000 mg | Freq: Once | ORAL | Status: AC | PRN

## 2023-10-04 MED ORDER — CHLORHEXIDINE GLUCONATE 0.12 % MT SOLN
OROMUCOSAL | Status: AC
  Filled 2023-10-04: qty 15

## 2023-10-04 MED ORDER — LACTATED RINGERS IV SOLN
INTRAVENOUS | Status: DC
Start: 2023-10-04 — End: 2023-10-04

## 2023-10-04 MED ORDER — BUPIVACAINE-EPINEPHRINE (PF) 0.5% -1:200000 IJ SOLN
INTRAMUSCULAR | Status: AC
  Filled 2023-10-04: qty 30

## 2023-10-04 MED ORDER — ACETAMINOPHEN 325 MG PO TABS: 650.0000 mg | ORAL_TABLET | Freq: Three times a day (TID) | ORAL | 0 refills | Status: AC | PRN

## 2023-10-04 MED ORDER — PROPOFOL 500 MG/50ML IV EMUL
INTRAVENOUS | Status: DC | PRN
  Administered 2023-10-04: 125 ug/kg/min via INTRAVENOUS

## 2023-10-04 MED ORDER — PROPOFOL 10 MG/ML IV BOLUS
INTRAVENOUS | Status: AC
  Filled 2023-10-04: qty 40

## 2023-10-04 MED ORDER — LIDOCAINE HCL (CARDIAC) PF 100 MG/5ML IV SOSY
PREFILLED_SYRINGE | INTRAVENOUS | Status: DC | PRN
  Administered 2023-10-04: 60 mg via INTRAVENOUS

## 2023-10-04 MED ORDER — ACETAMINOPHEN 10 MG/ML IV SOLN
INTRAVENOUS | Status: DC | PRN
  Administered 2023-10-04: 1000 mg via INTRAVENOUS

## 2023-10-04 MED ORDER — PROPOFOL 10 MG/ML IV BOLUS
INTRAVENOUS | Status: DC | PRN
  Administered 2023-10-04: 200 mg via INTRAVENOUS

## 2023-10-04 MED ORDER — DEXAMETHASONE SODIUM PHOSPHATE 10 MG/ML IJ SOLN
INTRAMUSCULAR | Status: AC
  Filled 2023-10-04: qty 1

## 2023-10-04 MED ORDER — MIDAZOLAM HCL 2 MG/2ML IJ SOLN
INTRAMUSCULAR | Status: DC | PRN
  Administered 2023-10-04: 2 mg via INTRAVENOUS

## 2023-10-04 MED ORDER — OXYCODONE HCL 5 MG PO TABS
ORAL_TABLET | ORAL | Status: AC
  Filled 2023-10-04: qty 1

## 2023-10-04 MED ORDER — MIDAZOLAM HCL 2 MG/2ML IJ SOLN
INTRAMUSCULAR | Status: AC
  Filled 2023-10-04: qty 2

## 2023-10-04 MED ORDER — ACETAMINOPHEN 10 MG/ML IV SOLN
INTRAVENOUS | Status: AC
  Filled 2023-10-04: qty 100

## 2023-10-04 MED ORDER — FENTANYL CITRATE (PF) 100 MCG/2ML IJ SOLN
INTRAMUSCULAR | Status: DC | PRN
  Administered 2023-10-04 (×2): 50 ug via INTRAVENOUS

## 2023-10-04 MED ORDER — OXYCODONE HCL 5 MG PO TABS
5.0000 mg | ORAL_TABLET | Freq: Once | ORAL | Status: AC | PRN
  Administered 2023-10-04: 5 mg via ORAL

## 2023-10-04 MED ORDER — LIDOCAINE HCL (PF) 2 % IJ SOLN
INTRAMUSCULAR | Status: AC
  Filled 2023-10-04: qty 5

## 2023-10-04 MED ORDER — ORAL CARE MOUTH RINSE: 15.0000 mL | Freq: Once | OROMUCOSAL | Status: AC

## 2023-10-04 MED ORDER — BUPIVACAINE LIPOSOME 1.3 % IJ SUSP
INTRAMUSCULAR | Status: AC
  Filled 2023-10-04: qty 20

## 2023-10-04 MED ORDER — CEFAZOLIN SODIUM-DEXTROSE 2-4 GM/100ML-% IV SOLN
INTRAVENOUS | Status: AC
  Filled 2023-10-04: qty 100

## 2023-10-04 MED ORDER — 0.9 % SODIUM CHLORIDE (POUR BTL) OPTIME
TOPICAL | Status: DC | PRN
  Administered 2023-10-04: 50 mL

## 2023-10-04 MED ORDER — BUPIVACAINE-EPINEPHRINE (PF) 0.5% -1:200000 IJ SOLN
INTRAMUSCULAR | Status: DC | PRN
  Administered 2023-10-04: 13 mL

## 2023-10-04 MED ORDER — IBUPROFEN 800 MG PO TABS: 800.0000 mg | ORAL_TABLET | Freq: Three times a day (TID) | ORAL | 0 refills | Status: DC | PRN

## 2023-10-04 MED ORDER — CHLORHEXIDINE GLUCONATE CLOTH 2 % EX PADS
6.0000 | MEDICATED_PAD | Freq: Once | CUTANEOUS | Status: AC
  Administered 2023-10-04: 6 via TOPICAL

## 2023-10-04 MED ORDER — KETOROLAC TROMETHAMINE 30 MG/ML IJ SOLN
INTRAMUSCULAR | Status: AC
  Filled 2023-10-04: qty 1

## 2023-10-04 SURGICAL SUPPLY — 33 items
BLADE SURG 15 STRL LF DISP TIS (BLADE) ×1 IMPLANT
BRIEF MESH DISP 2XL (UNDERPADS AND DIAPERS) ×1 IMPLANT
CATH IV 18X1.25 SAFETY (CATHETERS) IMPLANT
DRAPE PERI LITHO V/GYN (MISCELLANEOUS) ×1 IMPLANT
DRAPE UNDER BUTTOCK W/FLU (DRAPES) ×1 IMPLANT
DRSG GAUZE FLUFF 36X18 (GAUZE/BANDAGES/DRESSINGS) ×1 IMPLANT
ELECT REM PT RETURN 9FT ADLT (ELECTROSURGICAL) ×1 IMPLANT
ELECTRODE REM PT RTRN 9FT ADLT (ELECTROSURGICAL) ×1 IMPLANT
GAUZE 4X4 16PLY ~~LOC~~+RFID DBL (SPONGE) IMPLANT
GLOVE BIOGEL PI IND STRL 7.0 (GLOVE) ×1 IMPLANT
GLOVE SURG SYN 6.5 ES PF (GLOVE) ×3 IMPLANT
GLOVE SURG SYN 6.5 PF PI (GLOVE) ×1 IMPLANT
GOWN STRL REUS W/ TWL LRG LVL3 (GOWN DISPOSABLE) ×2 IMPLANT
GRAFT MYRIAD 3 LAYER 5X5 (Graft) IMPLANT
IV CATH 18X1.25 SAFETY (CATHETERS) ×2 IMPLANT
KIT TURNOVER CYSTO (KITS) ×1 IMPLANT
LABEL OR SOLS (LABEL) ×1 IMPLANT
MANIFOLD NEPTUNE II (INSTRUMENTS) ×1 IMPLANT
NDL HYPO 22X1.5 SAFETY MO (MISCELLANEOUS) ×1 IMPLANT
NEEDLE HYPO 22X1.5 SAFETY MO (MISCELLANEOUS) ×1 IMPLANT
NS IRRIG 500ML POUR BTL (IV SOLUTION) ×1 IMPLANT
PACK BASIN MINOR ARMC (MISCELLANEOUS) ×1 IMPLANT
PAD PREP OB/GYN DISP 24X41 (PERSONAL CARE ITEMS) ×1 IMPLANT
SHEARS HARMONIC 9CM CVD (BLADE) IMPLANT
SOL PREP PVP 2OZ (MISCELLANEOUS) ×1 IMPLANT
SOLUTION PREP PVP 2OZ (MISCELLANEOUS) ×1 IMPLANT
SURGILUBE 2OZ TUBE FLIPTOP (MISCELLANEOUS) ×1 IMPLANT
SUT SILK 3 0 SH 30 (SUTURE) IMPLANT
SUT VIC AB 3-0 SH 27X BRD (SUTURE) ×1 IMPLANT
SWAB CULTURE AMIES ANAERIB BLU (MISCELLANEOUS) IMPLANT
SYR 10ML LL (SYRINGE) ×1 IMPLANT
SYR 20ML LL LF (SYRINGE) ×1 IMPLANT
TRAP FLUID SMOKE EVACUATOR (MISCELLANEOUS) ×1 IMPLANT

## 2023-10-04 NOTE — Anesthesia Procedure Notes (Signed)
Procedure Name: LMA Insertion Date/Time: 10/04/2023 7:53 PM  Performed by: Jannet Mantis, CRNAPre-anesthesia Checklist: Patient identified, Emergency Drugs available, Suction available, Patient being monitored and Timeout performed Patient Re-evaluated:Patient Re-evaluated prior to induction Oxygen Delivery Method: Circle system utilized Preoxygenation: Pre-oxygenation with 100% oxygen Induction Type: IV induction Ventilation: Mask ventilation without difficulty LMA: LMA inserted LMA Size: 4.0 Tube type: Oral Number of attempts: 1 Placement Confirmation: positive ETCO2 and breath sounds checked- equal and bilateral Tube secured with: Tape Dental Injury: Teeth and Oropharynx as per pre-operative assessment

## 2023-10-04 NOTE — Transfer of Care (Signed)
Immediate Anesthesia Transfer of Care Note  Patient: Melanie Valdez  Procedure(s) Performed: EXAM UNDER ANESTHESIA possible seton (Rectum)  Patient Location: PACU  Anesthesia Type:General  Level of Consciousness: awake  Airway & Oxygen Therapy: Patient Spontanous Breathing  Post-op Assessment: Report given to RN  Post vital signs: stable  Last Vitals:  Vitals Value Taken Time  BP 111/70 10/04/23 1045  Temp 36.3 C 10/04/23 1042  Pulse 80 10/04/23 1047  Resp 14 10/04/23 1047  SpO2 99 % 10/04/23 1047  Vitals shown include unfiled device data.  Last Pain:  Vitals:   10/04/23 1042  TempSrc:   PainSc: Asleep        Past Medical History:  Diagnosis Date   ADHD (attention deficit hyperactivity disorder)    Anal fistula    Asthma    Complication of anesthesia    [place] and Vomiting   Family history of adverse reaction to anesthesia    mother has pseudocholinesterase deficiency   Gestational diabetes 2024   Gestational hypertension 2018   Irritable bowel syndrome with diarrhea    PONV (postoperative nausea and vomiting)    PVC's (premature ventricular contractions) 2020   Past Surgical History:  Procedure Laterality Date   BIOPSY  09/03/2023   Procedure: BIOPSY;  Surgeon: Regis Bill, MD;  Location: [facility] ENDOSCOPY;  Service: Endoscopy;;   BREAST REDUCTION SURGERY  2009   CESAREAN SECTION  06/28/2017   COLONOSCOPY WITH PROPOFOL N/A 06/16/2019   Procedure: COLONOSCOPY WITH PROPOFOL;  Surgeon: Pasty Spillers, MD;  Location: [facility] ENDOSCOPY;  Service: Endoscopy;  Laterality: N/A;   COLONOSCOPY WITH PROPOFOL N/A 09/03/2023   Procedure: COLONOSCOPY WITH PROPOFOL;  Surgeon: Regis Bill, MD;  Location: [facility] ENDOSCOPY;  Service: Endoscopy;  Laterality: N/A;   INCISION AND DRAINAGE PERIRECTAL ABSCESS  2023   LEEP  2015   TONSILLECTOMY AND ADENOIDECTOMY  1994   TYMPANOPLASTY     3 times   WISDOM TOOTH EXTRACTION  2009   Scheduled Meds: Continuous  Infusions:  lactated ringers     PRN Meds:.fentaNYL (SUBLIMAZE) injection, oxyCODONE **OR** oxyCODONE   Complications: No notable events documented.

## 2023-10-04 NOTE — Anesthesia Procedure Notes (Signed)
Procedure Name: LMA Insertion Date/Time: 10/04/2023 9:53 AM  Performed by: Jannet Mantis, CRNAPre-anesthesia Checklist: Patient identified, Patient being monitored, Timeout performed, Emergency Drugs available and Suction available Patient Re-evaluated:Patient Re-evaluated prior to induction Oxygen Delivery Method: Circle system utilized Preoxygenation: Pre-oxygenation with 100% oxygen Induction Type: IV induction Ventilation: Mask ventilation without difficulty LMA: LMA inserted LMA Size: 4.0 Tube type: Oral Number of attempts: 1 Placement Confirmation: positive ETCO2 and breath sounds checked- equal and bilateral Tube secured with: Tape Dental Injury: Teeth and Oropharynx as per pre-operative assessment

## 2023-10-04 NOTE — Discharge Instructions (Signed)
Fistula, Care After This sheet gives you information about how to care for yourself after your procedure. Your health care provider may also give you more specific instructions. If you have problems or questions, contact your health care provider. What can I expect after the procedure? After the procedure, it is common to have: Soreness. Bruising. Itching. Follow these instructions at home: site care Follow instructions from your health care provider about how to take care of your site. Make sure you: Special packing was placed within the fistula tract today.  There will be visible suture on the outside along with the packing.  Please leave everything in place as long as possible.  If anything falls out, okay to leave out and let the area continue to heal.  Expect some discharge from the area so can use sanitary pads as needed for the drainage. Check your site every day for signs of infection. Check for: Redness, swelling, or pain. Fluid or blood. Warmth. Pus or a bad smell.  General instructions Rest and then return to your normal activities as told by your health care provider.  tylenol and advil as needed for discomfort.  Please alternate between the two every four hours as needed for pain.    Use narcotics, if prescribed, only when tylenol and motrin is not enough to control pain.  325-650mg  every 8hrs to max of 3000mg /24hrs (including the 325mg  in every norco dose) for the tylenol.    Advil up to 800mg  per dose every 8hrs as needed for pain.   Keep all follow-up visits as told by your health care provider. This is important. Contact a health care provider if: You have redness, swelling, or pain around your site. You have fluid or blood coming from your site. Your site feels warm to the touch. You have pus or a bad smell coming from your site. You have a fever. Your sutures, skin glue, or adhesive strips loosen or come off sooner than expected. Get help right away if: You have  bleeding that does not stop with pressure or a dressing. Summary After the procedure, it is common to have some soreness, bruising, and itching at the site. Follow instructions from your health care provider about how to take care of your site. Check your site every day for signs of infection. Contact a health care provider if you have redness, swelling, or pain around your site, or your site feels warm to the touch. Keep all follow-up visits as told by your health care provider. This is important. This information is not intended to replace advice given to you by your health care provider. Make sure you discuss any questions you have with your health care provider. Document Released: 08/26/2015 Document Revised: 01/27/2018 Document Reviewed: 01/27/2018 Elsevier Interactive Patient Education  [employer].

## 2023-10-04 NOTE — Interval H&P Note (Signed)
 No change. OK to proceed.

## 2023-10-04 NOTE — Anesthesia Postprocedure Evaluation (Signed)
Anesthesia Post Note  Patient: Keyshia Orwick  Procedure(s) Performed: EXAM UNDER ANESTHESIA possible seton (Rectum)  Patient location during evaluation: PACU Anesthesia Type: General Level of consciousness: awake and alert Pain management: pain level controlled Vital Signs Assessment: post-procedure vital signs reviewed and stable Respiratory status: spontaneous breathing, nonlabored ventilation, respiratory function stable and patient connected to nasal cannula oxygen Cardiovascular status: blood pressure returned to baseline and stable Postop Assessment: no apparent nausea or vomiting Anesthetic complications: no   No notable events documented.   Last Vitals:  Vitals:   10/04/23 1130 10/04/23 1145  BP: 118/76 114/71  Pulse: 90 65  Resp: 18 17  Temp:    SpO2: 99% 98%    Last Pain:  Vitals:   10/04/23 1130  TempSrc:   PainSc: Asleep                 Louie Boston

## 2023-10-04 NOTE — Op Note (Signed)
Preoperative diagnosis: Anal fistula Postoperative diagnosis: Same  Procedure: Exam under anesthesia, myriad patch plug placement into anal fistula  Anesthesia: LMA  Surgeon: Tonna Boehringer  Wound Classification: Clean Contaminated  Specimen: None  Complications: None  EBL: 3 mL  Indications:  Patient is a 33 y.o. female with clinical exam concerning for an anal fistula.  Here today for formal exam under anesthesia.  See H&P for further details.  Description of procedure: The patient was taken to the operating room and placed in high lithotomy position.  LMA anesthesia was induced without any difficulty. A time-out was completed verifying correct patient, procedure, site, positioning, and implant(s) and/or special equipment prior to beginning this procedure.  Digital rectal exam noted no obvious indurated tissue or palpable fistulous tracts within the rectum.   Visualization within the lumen shortly afterwards confirmed the above findings.    Chronic wound located on the left posterior labial aspect approximately 4 to 5 cm away from the anal verge had minimal induration today with no obvious discharge.  Wound itself did not have any obvious openings either except for very mild chronic tissue measuring 7 mm x 5 mm.  After infusing local anesthesia surrounding the abscess site a 15 blade was used to create a small opening within the chronic tissue.  Probing through this area noted a tract easily all the way down to the rectum, but no evidence of actually penetrating the rectal mucosa.  Hydrogen peroxide was infused through this fistulous tract and scant bubbles noted within the rectal vault, however reinspection of the area of bubbling could still not visualize an obvious opening, despite multiple attempts at gentle probing both from within the rectal vault as well as through the skin wound.  To prevent further injury or creation of another fistulous tract, decision made at this point to pack the skin  opening and not place a seton.  After confirming hemostasis, 3 mm x 2 mm opening was packed with myriad matrix and the matrix secured to the wound using one 3-0 Vicryl, essentially acting as a fistula plug.  The patient tolerated the procedure well and  taken to the postoperative care unit in stable condition.  Sponge and instrument count was correct at the end of the procedure.

## 2023-10-04 NOTE — Anesthesia Preprocedure Evaluation (Signed)
Anesthesia Evaluation  Patient identified by MRN, date of birth, ID band Patient awake    Reviewed: Allergy & Precautions, NPO status , Patient's Chart, lab work & pertinent test results  History of Anesthesia Complications (+) PONV and history of anesthetic complications  Airway Mallampati: II  TM Distance: >3 FB Neck ROM: full    Dental no notable dental hx.    Pulmonary asthma    Pulmonary exam normal        Cardiovascular negative cardio ROS Normal cardiovascular exam     Neuro/Psych  Headaches  Neuromuscular disease  negative psych ROS   GI/Hepatic negative GI ROS, Neg liver ROS,,,  Endo/Other  negative endocrine ROSdiabetes    Renal/GU      Musculoskeletal   Abdominal   Peds  Hematology negative hematology ROS (+)   Anesthesia Other Findings Past Medical History: No date: ADHD (attention deficit hyperactivity disorder) No date: Anal fistula No date: Asthma No date: Complication of anesthesia     Comment:  [place] and Vomiting No date: Family history of adverse reaction to anesthesia     Comment:  mother has pseudocholinesterase deficiency 2024: Gestational diabetes 2018: Gestational hypertension No date: Irritable bowel syndrome with diarrhea No date: PONV (postoperative nausea and vomiting) 2020: PVC's (premature ventricular contractions)  Past Surgical History: 09/03/2023: BIOPSY     Comment:  Procedure: BIOPSY;  Surgeon: Regis Bill, MD;                Location: [facility];  Service: Endoscopy;; 2009: BREAST REDUCTION SURGERY 06/28/2017: CESAREAN SECTION 06/16/2019: COLONOSCOPY WITH PROPOFOL; N/A     Comment:  Procedure: COLONOSCOPY WITH PROPOFOL;  Surgeon:               Pasty Spillers, MD;  Location: [facility];                Service: Endoscopy;  Laterality: N/A; 09/03/2023: COLONOSCOPY WITH PROPOFOL; N/A     Comment:  Procedure: COLONOSCOPY WITH PROPOFOL;  Surgeon:                Regis Bill, MD;  Location: [facility];                Service: Endoscopy;  Laterality: N/A; 2023: INCISION AND DRAINAGE PERIRECTAL ABSCESS 2015: LEEP 1994: TONSILLECTOMY AND ADENOIDECTOMY No date: TYMPANOPLASTY     Comment:  3 times 2009: WISDOM TOOTH EXTRACTION  BMI    Body Mass Index: 29.26 kg/m      Reproductive/Obstetrics negative OB ROS                              Anesthesia Physical Anesthesia Plan  ASA: 2  Anesthesia Plan: General LMA   Post-op Pain Management: Toradol IV (intra-op)* and Ofirmev IV (intra-op)*   Induction: Intravenous  PONV Risk Score and Plan: 4 or greater and Dexamethasone, Ondansetron, Midazolam, Treatment may vary due to age or medical condition, Propofol infusion and TIVA  Airway Management Planned: LMA  Additional Equipment:   Intra-op Plan:   Post-operative Plan: Extubation in OR  Informed Consent: I have reviewed the patients History and Physical, chart, labs and discussed the procedure including the risks, benefits and alternatives for the proposed anesthesia with the patient or authorized representative who has indicated his/her understanding and acceptance.     Dental Advisory Given  Plan Discussed with: Anesthesiologist, CRNA and Surgeon  Anesthesia Plan Comments: (Patient consented for risks of anesthesia  including but not limited to:  - adverse reactions to medications - damage to eyes, teeth, lips or other oral mucosa - nerve damage due to positioning  - sore throat or hoarseness - Damage to heart, brain, nerves, lungs, other parts of body or loss of life  Patient voiced understanding and assent.)         Anesthesia Quick Evaluation

## 2023-10-10 ENCOUNTER — Encounter: Payer: Self-pay | Admitting: Physical Medicine and Rehabilitation

## 2023-10-13 ENCOUNTER — Encounter: Payer: Self-pay | Admitting: Emergency Medicine

## 2023-10-13 ENCOUNTER — Ambulatory Visit
Admission: EM | Admit: 2023-10-13 | Discharge: 2023-10-13 | Disposition: A | Discharge status: Home / Self Care | Attending: Emergency Medicine | Admitting: Emergency Medicine

## 2023-10-13 DIAGNOSIS — Z20828 Contact with and (suspected) exposure to other viral communicable diseases: Secondary | ICD-10-CM

## 2023-10-13 DIAGNOSIS — J111 Influenza due to unidentified influenza virus with other respiratory manifestations: Secondary | ICD-10-CM | POA: Diagnosis not present

## 2023-10-13 DIAGNOSIS — J4521 Mild intermittent asthma with (acute) exacerbation: Secondary | ICD-10-CM

## 2023-10-13 LAB — RESP PANEL BY RT-PCR (FLU A&B, COVID) ARPGX2
Influenza A by PCR: NEGATIVE
Influenza B by PCR: NEGATIVE
SARS Coronavirus 2 by RT PCR: NEGATIVE

## 2023-10-13 MED ORDER — PROMETHAZINE-DM 6.25-15 MG/5ML PO SYRP: 5.0000 mL | ORAL_SOLUTION | Freq: Four times a day (QID) | ORAL | 0 refills | Status: DC | PRN

## 2023-10-13 MED ORDER — PREDNISONE 20 MG PO TABS: 40.0000 mg | ORAL_TABLET | Freq: Every day | ORAL | 0 refills | Status: AC

## 2023-10-13 MED ORDER — IBUPROFEN 600 MG PO TABS: 600.0000 mg | ORAL_TABLET | Freq: Three times a day (TID) | ORAL | 0 refills | Status: AC | PRN

## 2023-10-13 MED ORDER — OSELTAMIVIR PHOSPHATE 75 MG PO CAPS: 75.0000 mg | ORAL_CAPSULE | Freq: Two times a day (BID) | ORAL | 0 refills | Status: DC

## 2023-10-13 MED ORDER — FLUTICASONE PROPIONATE 50 MCG/ACT NA SUSP: 2.0000 | Freq: Every day | NASAL | 0 refills | Status: DC

## 2023-10-13 MED ORDER — ALBUTEROL SULFATE HFA 108 (90 BASE) MCG/ACT IN AERS: 1.0000 | INHALATION_SPRAY | RESPIRATORY_TRACT | 0 refills | Status: AC | PRN

## 2023-10-13 NOTE — ED Triage Notes (Signed)
 Patient reports cough and chest congestion that started today.  Patient unsure of fevers.  Patient has history of asthma.

## 2023-10-13 NOTE — Discharge Instructions (Signed)
 Take two puffs from your albuterol inhaler with your spacer every 4 hours for 2 days, then every 6 hours for 2 days, then as needed. You can back off if you start to improve  sooner. Take tylenol 1 gram combined with  600 mg of motrin up to 3-4 times a day as needed for pain. Make sure you drink extra fluids.  Flonase, Mucinex D, saline nasal irrigation with a NeilMed rinse and distilled water as often as you want to prevent bacterial sinus infection.  Prednisone in case your asthma starts to get worse.  Promethazine DM for cough.   Go to www.goodrx.com  or www.costplusdrugs.com to look up your medications. This will give you a list of where you can find your prescriptions at the most affordable prices. Or ask the pharmacist what the cash price is, or if they have any other discount programs available to help make your medication more affordable. This can be less expensive than what you would pay with insurance.

## 2023-10-13 NOTE — ED Provider Notes (Signed)
 HPI  SUBJECTIVE:  Melanie Valdez is a 33 y.o. female who presents with cough productive of mucus, chest and back tightness, malaise, nasal congestion postnasal drip, shortness of breath starting last night.  Her daughter is here today and was diagnosed with influenza A.  Patient states that she has felt feverish, but did not measure her temperature.  No body aches, headaches, rhinorrhea, wheezing, dyspnea on exertion, nausea, vomiting, diarrhea, abdominal pain.  No antipyretic in the past 6 hours.  She has been using her inhaler with her spacer with improvement in her shortness of breath.  No aggravating factors.  Patient has a past medical history of asthma, last admission many years ago.  No intubations or recent steroid use.  LMP: 2/10.  Denies possibility being pregnant.  PCP:  family practice    Past Medical History:  Diagnosis Date   ADHD (attention deficit hyperactivity disorder)    Anal fistula    Asthma    Complication of anesthesia    [place] and Vomiting   Family history of adverse reaction to anesthesia    mother has pseudocholinesterase deficiency   Gestational diabetes 2024   Gestational hypertension 2018   Irritable bowel syndrome with diarrhea    PONV (postoperative nausea and vomiting)    PVC's (premature ventricular contractions) 2020    Past Surgical History:  Procedure Laterality Date   BIOPSY  09/03/2023   Procedure: BIOPSY;  Surgeon: Regis Bill, MD;  Location: [facility];  Service: Endoscopy;;   BREAST REDUCTION SURGERY  2009   CESAREAN SECTION  06/28/2017   COLONOSCOPY WITH PROPOFOL N/A 06/16/2019   Procedure: COLONOSCOPY WITH PROPOFOL;  Surgeon: Pasty Spillers, MD;  Location: [facility];  Service: Endoscopy;  Laterality: N/A;   COLONOSCOPY WITH PROPOFOL N/A 09/03/2023   Procedure: COLONOSCOPY WITH PROPOFOL;  Surgeon: Regis Bill, MD;  Location: [facility];  Service: Endoscopy;  Laterality: N/A;   INCISION AND  DRAINAGE PERIRECTAL ABSCESS  2023   LEEP  2015   TONSILLECTOMY AND ADENOIDECTOMY  1994   TYMPANOPLASTY     3 times   WISDOM TOOTH EXTRACTION  2009    Family History  Problem Relation Age of Onset   Hypertension Maternal Grandmother    Kidney disease Maternal Grandmother    Breast cancer Maternal Grandmother    Heart disease Maternal Grandfather    Breast cancer Other        MGGGM   Breast cancer Other        Mgreataunt   Breast cancer Other        m 2nd cousin    Social History   Tobacco Use   Smoking status: Never    Passive exposure: Never   Smokeless tobacco: Never  Vaping Use   Vaping status: Never Used  Substance Use Topics   Alcohol use: Yes    Alcohol/week: 1.0 standard drink of alcohol    Types: 1 Glasses of wine per week    Comment: occassional   Drug use: Never    No current facility-administered medications for this encounter.  Current Outpatient Medications:    albuterol (VENTOLIN HFA) 108 (90 Base) MCG/ACT inhaler, Inhale 1-2 puffs into the lungs every 4 (four) hours as needed for wheezing or shortness of breath., Disp: 1 each, Rfl: 0   fluticasone (FLONASE) 50 MCG/ACT nasal spray, Place 2 sprays into both nostrils daily., Disp: 16 g, Rfl: 0   ibuprofen (ADVIL) 600 MG tablet, Take 1 tablet (600 mg total) by mouth every 8 (  eight) hours as needed., Disp: 30 tablet, Rfl: 0   oseltamivir (TAMIFLU) 75 MG capsule, Take 1 capsule (75 mg total) by mouth 2 (two) times daily. X 5 days, Disp: 10 capsule, Rfl: 0   predniSONE (DELTASONE) 20 MG tablet, Take 2 tablets (40 mg total) by mouth daily with breakfast for 5 days., Disp: 10 tablet, Rfl: 0   promethazine-dextromethorphan (PROMETHAZINE-DM) 6.25-15 MG/5ML syrup, Take 5 mLs by mouth 4 (four) times daily as needed for cough., Disp: 118 mL, Rfl: 0   acetaminophen (TYLENOL) 325 MG tablet, Take 2 tablets (650 mg total) by mouth every 8 (eight) hours as needed for mild pain (pain score 1-3)., Disp: 40 tablet, Rfl: 0    amphetamine-dextroamphetamine (ADDERALL XR) 15 MG 24 hr capsule, Take 1 capsule by mouth every morning., Disp: 30 capsule, Rfl: 0   cetirizine (ZYRTEC) 10 MG tablet, Take 10 mg by mouth at bedtime., Disp: , Rfl:    clobetasol cream (TEMOVATE) 0.05 %, Apply 1 Application topically 2 (two) times daily as needed (eczema)., Disp: , Rfl:    docusate sodium (COLACE) 100 MG capsule, Take 1 capsule (100 mg total) by mouth 2 (two) times daily as needed for up to 10 days for mild constipation., Disp: 20 capsule, Rfl: 0   fexofenadine (ALLEGRA) 180 MG tablet, Take 180 mg by mouth in the morning., Disp: , Rfl:    gabapentin (NEURONTIN) 100 MG capsule, Take 100-300 mg by mouth as needed., Disp: , Rfl:    norethindrone (MICRONOR) 0.35 MG tablet, Take 1 tablet (0.35 mg total) by mouth every evening., Disp: 28 tablet, Rfl: 11   triamcinolone (KENALOG) 0.025 % cream, Apply 1 Application topically daily as needed (left ear irritation)., Disp: , Rfl:    VIBERZI 75 MG TABS, Take 75 mg by mouth 2 (two) times daily., Disp: , Rfl:   Allergies  Allergen Reactions   Sulfa Antibiotics Anaphylaxis and Itching   Codeine Itching   Hydrocodone-Acetaminophen Itching    mild rash/itching (PATIENT DENIES ALLERGY)     ROS  As noted in HPI.   Physical Exam  BP 128/82 (BP Location: Left Arm)   Pulse (!) 107   Temp 98.6 F (37 C) (Oral)   Resp 14   Ht 5\' 2"  (1.575 m)   Wt 72.6 kg   LMP 09/23/2023 (Exact Date)   SpO2 97%   BMI 29.26 kg/m   Constitutional: Well developed, well nourished, no acute distress Eyes: PERRL, EOMI, conjunctiva normal bilaterally HENT: Normocephalic, atraumatic,mucus membranes moist.  Positive maxillary sinus tenderness.  Tonsils surgically absent.  No postnasal drip. Neck: Positive cervical lymphadenopathy Respiratory: Clear to auscultation bilaterally, no rales, no wheezing, no rhonchi.  Positive anterior chest wall tenderness Cardiovascular: Regular tachycardia, no murmurs, no gallops,  no rubs GI: nondistended skin: No rash, skin intact Musculoskeletal: no deformities Neurologic: Alert & oriented x 3, CN III-XII grossly intact, no motor deficits, sensation grossly intact Psychiatric: Speech and behavior appropriate   ED Course   Medications - No data to display  Orders Placed This Encounter  Procedures   Resp Panel by RT-PCR (Flu A&B, Covid) Anterior Nasal Swab    Standing Status:   Standing    Number of Occurrences:   1   Results for orders placed or performed during the hospital encounter of 10/13/23 (from the past 24 hours)  Resp Panel by RT-PCR (Flu A&B, Covid) Anterior Nasal Swab     Status: None   Collection Time: 10/13/23 10:36 AM   Specimen: Anterior  Nasal Swab  Result Value Ref Range   SARS Coronavirus 2 by RT PCR NEGATIVE NEGATIVE   Influenza A by PCR NEGATIVE NEGATIVE   Influenza B by PCR NEGATIVE NEGATIVE   No results found.  ED Clinical Impression  1. Exposure to influenza   2. Mild intermittent asthma with exacerbation   3. Influenza-like illness      ED Assessment/Plan     Patient presents with acute illness with systemic symptoms of tachycardia.  Influenza A, B, COVID-negative.  Discussed this with patient while in department however, given her close exposure to influenza and her history of asthma, will send home with Tamiflu.  Regularly scheduled albuterol inhaler with a spacer for 4 days, then as needed thereafter.  Refilling albuterol.  Tamiflu, prednisone 40 mg for 5 days, Flonase, Mucinex D, Promethazine DM, saline nasal irrigation, Tylenol/ibuprofen together 3-4 times a day as needed.  Follow-up with PCP as needed.  ER return precautions given.  Work note.  Discussed labs,  MDM, treatment plan, and plan for follow-up with patient Discussed sn/sx that should prompt return to the ED. patient agrees with plan.   Meds ordered this encounter  Medications   oseltamivir (TAMIFLU) 75 MG capsule    Sig: Take 1 capsule (75 mg total) by  mouth 2 (two) times daily. X 5 days    Dispense:  10 capsule    Refill:  0   fluticasone (FLONASE) 50 MCG/ACT nasal spray    Sig: Place 2 sprays into both nostrils daily.    Dispense:  16 g    Refill:  0   albuterol (VENTOLIN HFA) 108 (90 Base) MCG/ACT inhaler    Sig: Inhale 1-2 puffs into the lungs every 4 (four) hours as needed for wheezing or shortness of breath.    Dispense:  1 each    Refill:  0   predniSONE (DELTASONE) 20 MG tablet    Sig: Take 2 tablets (40 mg total) by mouth daily with breakfast for 5 days.    Dispense:  10 tablet    Refill:  0   promethazine-dextromethorphan (PROMETHAZINE-DM) 6.25-15 MG/5ML syrup    Sig: Take 5 mLs by mouth 4 (four) times daily as needed for cough.    Dispense:  118 mL    Refill:  0   ibuprofen (ADVIL) 600 MG tablet    Sig: Take 1 tablet (600 mg total) by mouth every 8 (eight) hours as needed.    Dispense:  30 tablet    Refill:  0      *This clinic note was created using Scientist, clinical (histocompatibility and immunogenetics). Therefore, there may be occasional mistakes despite careful proofreading. ?    Domenick Gong, MD 10/14/23 1036

## 2023-10-16 ENCOUNTER — Encounter: Payer: Self-pay | Admitting: Family Medicine

## 2023-10-18 ENCOUNTER — Encounter: Payer: 59 | Admitting: Neurology

## 2023-10-23 ENCOUNTER — Ambulatory Visit: Payer: 59 | Admitting: Neurosurgery

## 2023-10-23 VITALS — BP 118/78 | Ht 62.0 in | Wt 160.0 lb

## 2023-10-23 DIAGNOSIS — R29898 Other symptoms and signs involving the musculoskeletal system: Secondary | ICD-10-CM | POA: Diagnosis not present

## 2023-10-23 DIAGNOSIS — G5722 Lesion of femoral nerve, left lower limb: Secondary | ICD-10-CM | POA: Diagnosis not present

## 2023-10-23 DIAGNOSIS — G5702 Lesion of sciatic nerve, left lower limb: Secondary | ICD-10-CM | POA: Diagnosis not present

## 2023-10-23 NOTE — Progress Notes (Signed)
 Referring Physician:  Erasmo Downer, MD [address] [place],  [place] [address]  Primary Physician:  Erasmo Downer, MD  History of Present Illness: 10/23/2023 Ms. Melanie Valdez is here today with a chief complaint of left lower extremity weakness since recent delivery.  She had an epidural during this delivery and was then stirrup/dorsal lithotomy for an extended period, she states at least 3 hours.  Once the epidural wore off she noticed that her left lower extremity was incredibly weak and numb.  We have been following her closely over the past few months to see about any recovery in her femoral and sciatic function.  She has had some return of function however continues to have deficits in both her sensory function and her motor function.  Review of Systems:  A 10 point review of systems is negative, except for the pertinent positives and negatives detailed in the HPI.  Past Medical History: Past Medical History:  Diagnosis Date   ADHD (attention deficit hyperactivity disorder)    Anal fistula    Asthma    Complication of anesthesia    [place] and Vomiting   Family history of adverse reaction to anesthesia    mother has pseudocholinesterase deficiency   Gestational diabetes 2024   Gestational hypertension 2018   Irritable bowel syndrome with diarrhea    PONV (postoperative nausea and vomiting)    PVC's (premature ventricular contractions) 2020    Past Surgical History: Past Surgical History:  Procedure Laterality Date   BIOPSY  09/03/2023   Procedure: BIOPSY;  Surgeon: Regis Bill, MD;  Location: [facility];  Service: Endoscopy;;   BREAST REDUCTION SURGERY  2009   CESAREAN SECTION  06/28/2017   COLONOSCOPY WITH PROPOFOL N/A 06/16/2019   Procedure: COLONOSCOPY WITH PROPOFOL;  Surgeon: Pasty Spillers, MD;  Location: [facility];  Service: Endoscopy;  Laterality: N/A;   COLONOSCOPY WITH PROPOFOL N/A 09/03/2023    Procedure: COLONOSCOPY WITH PROPOFOL;  Surgeon: Regis Bill, MD;  Location: [facility];  Service: Endoscopy;  Laterality: N/A;   INCISION AND DRAINAGE PERIRECTAL ABSCESS  2023   LEEP  2015   TONSILLECTOMY AND ADENOIDECTOMY  1994   TYMPANOPLASTY     3 times   WISDOM TOOTH EXTRACTION  2009    Allergies: Allergies as of 10/23/2023 - Review Complete 10/23/2023  Allergen Reaction Noted   Sulfa antibiotics Anaphylaxis and Itching 07/23/2011   Codeine Itching 07/23/2011   Hydrocodone-acetaminophen Itching 06/08/2009    Medications:  Current Outpatient Medications:    acetaminophen (TYLENOL) 325 MG tablet, Take 2 tablets (650 mg total) by mouth every 8 (eight) hours as needed for mild pain (pain score 1-3)., Disp: 40 tablet, Rfl: 0   albuterol (VENTOLIN HFA) 108 (90 Base) MCG/ACT inhaler, Inhale 1-2 puffs into the lungs every 4 (four) hours as needed for wheezing or shortness of breath., Disp: 1 each, Rfl: 0   ALPHA LIPOIC ACID PO, Take 1 tablet by mouth daily., Disp: , Rfl:    amphetamine-dextroamphetamine (ADDERALL XR) 15 MG 24 hr capsule, Take 1 capsule by mouth every morning., Disp: 30 capsule, Rfl: 0   cetirizine (ZYRTEC) 10 MG tablet, Take 10 mg by mouth at bedtime., Disp: , Rfl:    clobetasol cream (TEMOVATE) 0.05 %, Apply 1 Application topically 2 (two) times daily as needed (eczema)., Disp: , Rfl:    fexofenadine (ALLEGRA) 180 MG tablet, Take 180 mg by mouth in the morning., Disp: , Rfl:    fluticasone (  FLONASE) 50 MCG/ACT nasal spray, Place 2 sprays into both nostrils daily., Disp: 16 g, Rfl: 0   gabapentin (NEURONTIN) 100 MG capsule, Take 100-300 mg by mouth as needed., Disp: , Rfl:    ibuprofen (ADVIL) 600 MG tablet, Take 1 tablet (600 mg total) by mouth every 8 (eight) hours as needed., Disp: 30 tablet, Rfl: 0   norethindrone (MICRONOR) 0.35 MG tablet, Take 1 tablet (0.35 mg total) by mouth every evening., Disp: 28 tablet, Rfl: 11   triamcinolone (KENALOG) 0.025 %  cream, Apply 1 Application topically daily as needed (left ear irritation)., Disp: , Rfl:    VIBERZI 75 MG TABS, Take 75 mg by mouth 2 (two) times daily., Disp: , Rfl:   Social History: Social History   Tobacco Use   Smoking status: Never    Passive exposure: Never   Smokeless tobacco: Never  Vaping Use   Vaping status: Never Used  Substance Use Topics   Alcohol use: Yes    Alcohol/week: 1.0 standard drink of alcohol    Types: 1 Glasses of wine per week    Comment: occassional   Drug use: Never    Family Medical History: Family History  Problem Relation Age of Onset   Hypertension Maternal Grandmother    Kidney disease Maternal Grandmother    Breast cancer Maternal Grandmother    Heart disease Maternal Grandfather    Breast cancer Other        MGGGM   Breast cancer Other        Mgreataunt   Breast cancer Other        m 2nd cousin    Physical Examination: Vitals:   10/23/23 1157  BP: 118/78    General: Patient is in no apparent distress. Attention to examination is appropriate.  Neck:   Supple.  Full range of motion.  Respiratory: Patient is breathing without any difficulty.   NEUROLOGICAL:     Awake, alert, oriented to person, place, and time.  Speech is clear and fluent.   Cranial Nerves: Pupils equal round and reactive to light.  Facial tone is symmetric. Shoulder shrug is symmetric. Tongue protrusion is midline.  There is no pronator drift.  Motor Exam:  On physical examination she does appear to have some slight decrease in bulk in her hamstrings musculature as well as vastus medialis, otherwise no major motor/muscle losses.  She does have knee extension with weakness in dorsiflexion plantarflexion with weakness however she does show an improved range of motion since her first visit.   Medical Decision Making  Electrodiagnostics: Follow-up EMG pending  I have personally reviewed the images and electrodiagnostics and agree with the above  interpretation.  Assessment and Plan: Melanie Valdez is a pleasant 33 y.o. female with history of left lower extremity weakness and numbness after a birth of a recent child.  She was in the dorsolithotomy position for approximately 3+ hours.  She had an epidural anesthesia placed and was able to notice a decrease in strength and sensation in her left lower extremity after the medicine had worn off.  She states that her left lower extremity was initially flaccid but thankfully rapidly regained function over the course of a month.   We continue to follow her for motor improvement.  At this point she exceeds recovery for necessitating a nerve transfer or nerve reconstruction type of surgery.  She continues to have reinnervation issues and symptoms including cramping and soreness.  This has since migrated down to the bottom of her  foot.  She has a functional capacity evaluation which is pending.  At this point continue to work on light duty as it is unclear how much she can lift with her lower extremity weakness.  Thank you for involving me in the care of this patient.    Lovenia Kim MD/MSCR Neurosurgery - Peripheral Nerve Surgery    Spent a total of 30 minutes with the patient reviewing her chart, inpatient evaluation, results review, coordination of her care going forward, and documentation.

## 2023-11-04 ENCOUNTER — Ambulatory Visit: Payer: 59 | Admitting: Neurology

## 2023-11-08 ENCOUNTER — Other Ambulatory Visit: Payer: Self-pay | Admitting: Family Medicine

## 2023-11-11 MED ORDER — AMPHETAMINE-DEXTROAMPHET ER 15 MG PO CP24
15.0000 mg | ORAL_CAPSULE | ORAL | 0 refills | Status: DC
Start: 2023-11-11 — End: 2024-01-23

## 2023-12-01 ENCOUNTER — Telehealth: Admitting: Physician Assistant

## 2023-12-01 DIAGNOSIS — J069 Acute upper respiratory infection, unspecified: Secondary | ICD-10-CM

## 2023-12-01 NOTE — Progress Notes (Signed)

## 2023-12-10 ENCOUNTER — Encounter: Payer: Self-pay | Admitting: Family Medicine

## 2023-12-10 ENCOUNTER — Encounter: Payer: Self-pay | Admitting: Neurosurgery

## 2023-12-11 NOTE — Telephone Encounter (Signed)
 Please help find her an appointment. Virtual ok

## 2023-12-12 NOTE — Telephone Encounter (Signed)
 Left vm for pt to return call to schedule virtual appt.

## 2024-01-10 ENCOUNTER — Encounter: Payer: Self-pay | Admitting: Physical Medicine and Rehabilitation

## 2024-01-10 ENCOUNTER — Encounter: Payer: 59 | Attending: Physical Medicine and Rehabilitation | Admitting: Physical Medicine and Rehabilitation

## 2024-01-10 VITALS — BP 132/84 | HR 85 | Ht 62.0 in | Wt 167.0 lb

## 2024-01-10 DIAGNOSIS — M792 Neuralgia and neuritis, unspecified: Secondary | ICD-10-CM | POA: Diagnosis present

## 2024-01-10 DIAGNOSIS — G5722 Lesion of femoral nerve, left lower limb: Secondary | ICD-10-CM | POA: Diagnosis present

## 2024-01-10 DIAGNOSIS — R269 Unspecified abnormalities of gait and mobility: Secondary | ICD-10-CM

## 2024-01-10 DIAGNOSIS — M21372 Foot drop, left foot: Secondary | ICD-10-CM | POA: Diagnosis present

## 2024-01-10 DIAGNOSIS — G5702 Lesion of sciatic nerve, left lower limb: Secondary | ICD-10-CM

## 2024-01-10 MED ORDER — PREGABALIN 50 MG PO CAPS: 50.0000 mg | ORAL_CAPSULE | Freq: Two times a day (BID) | ORAL | 5 refills | Status: DC

## 2024-01-10 NOTE — Patient Instructions (Signed)
 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   For nerve pain- Lyrica/pregabalin- -  50 mg 2x/day-  x 1-2 weeks- 100 mg 2x/day-   2. Suggest shoes with an upended toe- along with wide toe box.   3. If Lyrica doesn't work, don't want to go to Duloxetine due to side effects in past with SSRI's.    4. Will try Tramadol- or Trileptal /Keppra if these meds don't work.   5. Call me in 2-4 weks to let me know how things going- call earlier if side effects.    6. F/U in 3months-

## 2024-01-10 NOTE — Progress Notes (Signed)
 Subjective:    Patient ID: Melanie Valdez, female    DOB: October 24, 1990, 33 y.o.   MRN: [id number]  HPI Pt is a 33 yr old female with hx  asthma; IBS; ADHD, facial nerve tumor- and sent here due to  L Femoral, sciatic and tibial  neuropathy-   Here for evaluation of neuropathic pain.     Had a baby and developed L femoral.sciaitci/tibial neuropathy from  Placenta crumbled -came out in tiny pieces- cleaned out "in room- used stirrups- and epidural - couldn't move L leg-  even after- everything-   Didn't start PT until cleared by OB- started at 10 weeks- then got EMG- PT did nothing-  Still cannot always know where foot is due to severe sensory neuropathy. Had another EMG 1 month ago- femoral nerve is back- tibial is firing, but not 50% yet.  And nothing through foot at all .   Has an usual AFO- on L foot- needed help with PF- so has frontal AFO- not helping her "feel her foot".   Broke 2 toes- ran into something on floor and didn't know it.   Isn't barefoot because doesn't feel it  When went home, couldn't stand at all and couldn't move toes.   Having a lot of nerve pain-   Neurology put her on Gabapentin - on 300-900 mg TID  Has sometimes gone up to 900 mg TID- if has been working.   Just makes sleepy-   Has heard of Duloxetine but never taken.  Has had issues with delayed orgasm with Lexapro -  Coming off it "slow form of hell".  Never got sex drive back fully-   Nerves "coming back"- and cannot even tolerate putting on socks- burning /on fire- and also has some charlie horses but not muscle cramps-  Cannot wear certain types of shoes anymore- cannot get feet in.   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   Pain Inventory Average Pain 3 Pain Right Now 3 My pain is intermittent, aching, and numb  In the last 24 hours, has pain interfered with the following? General activity 3 Relation with others 3 Enjoyment of life  3 What TIME of day is your pain at its worst? varies Sleep (in general) Good  Pain is worse with: walking and standing Pain improves with: time Relief from Meds: 0  how many minutes can you walk? unlimited ability to climb steps?  yes do you drive?  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  weakness numbness tingling spasms  New pt  New pt    Family History  Problem Relation Age of Onset   Hypertension Maternal Grandmother    Kidney disease Maternal Grandmother    Breast cancer Maternal Grandmother    Heart disease Maternal Grandfather    Breast cancer Other        MGGGM   Breast cancer Other        Mgreataunt   Breast cancer Other        m 2nd cousin   Social History   Socioeconomic History   Marital status: Married    Spouse name: Zoila Hines   Number of children: 3   Years of education: Not on file   Highest education level: Professional school degree (e.g., MD, DDS, DVM, JD)  Occupational History   Not on file  Tobacco Use   Smoking status: Never  Passive exposure: Never   Smokeless tobacco: Never  Vaping Use   Vaping status: Never Used  Substance and Sexual Activity   Alcohol use: Yes    Alcohol/week: 1.0 standard drink of alcohol    Types: 1 Glasses of wine per week    Comment: occassional   Drug use: Never   Sexual activity: Not on file  Other Topics Concern   Not on file  Social History Narrative   Not on file   Social Drivers of Health   Financial Resource Strain: Low Risk  (08/19/2023)   Received from [facility]   Overall Financial Resource Strain (CARDIA)    Difficulty of Paying Living Expenses: Not hard at all  Food Insecurity: No Food Insecurity (08/19/2023)   Received from [facility]   Hunger Vital Sign    Worried About Running Out of Food in the Last Year: Never true    Ran Out of Food in the Last Year: Never true  Transportation Needs: No Transportation Needs (08/19/2023)    Received from [facility] - Transportation    In the past 12 months, has lack of transportation kept you from medical appointments or from getting medications?: No    Lack of Transportation (Non-Medical): No  Physical Activity: Sufficiently Active (05/28/2023)   Exercise Vital Sign    Days of Exercise per Week: 3 days    Minutes of Exercise per Session: 60 min  Stress: Stress Concern Present (05/28/2023)   [employer] of Occupational Health - Occupational Stress Questionnaire    Feeling of Stress : To some extent  Social Connections: Socially Integrated (05/28/2023)   Social Connection and Isolation Panel [NHANES]    Frequency of Communication with Friends and Family: More than three times a week    Frequency of Social Gatherings with Friends and Family: Once a week    Attends Religious Services: More than 4 times per year    Active Member of Clubs or Organizations: Yes    Attends Banker Meetings: More than 4 times per year    Marital Status: Married   Past Surgical History:  Procedure Laterality Date   BIOPSY  09/03/2023   Procedure: BIOPSY;  Surgeon: Shane Darling, MD;  Location: [facility];  Service: Endoscopy;;   BREAST REDUCTION SURGERY  2009   CESAREAN SECTION  06/28/2017   COLONOSCOPY WITH PROPOFOL  N/A 06/16/2019   Procedure: COLONOSCOPY WITH PROPOFOL ;  Surgeon: Irby Mannan, MD;  Location: [facility] END[facility];  Service: Endoscopy;  Laterality: N/A;   COLONOSCOPY WITH PROPOFOL  N/A 09/03/2023   Procedure: COLONOSCOPY WITH PROPOFOL ;  Surgeon: Shane Darling, MD;  Location: [facility];  Service: Endoscopy;  Laterality: N/A;   INCISION AND DRAINAGE PERIRECTAL ABSCESS  2023   LEEP  2015   TONSILLECTOMY AND ADENOIDECTOMY  1994   TYMPANOPLASTY     3 times   WISDOM TOOTH EXTRACTION  2009   Past Medical History:  Diagnosis Date   ADHD (attention deficit hyperactivity disorder)    Anal fistula    Asthma     Complication of anesthesia    [place] and Vomiting   Family history of adverse reaction to anesthesia    mother has pseudocholinesterase deficiency   Gestational diabetes 2024   Gestational hypertension 2018   Irritable bowel syndrome with diarrhea    PONV (postoperative nausea and vomiting)    PVC's (premature ventricular contractions) 2020   BP 132/84   Pulse 85  Ht 5\' 2"  (1.575 m)   Wt 167 lb (75.8 kg)   SpO2 99%   BMI 30.54 kg/m   Opioid Risk Score:   Fall Risk Score:  `1  Depression screen [facility] 2/9     01/10/2024   10:44 AM 07/18/2023   11:05 AM 05/28/2023   10:25 AM 05/21/2022    2:32 PM 11/23/2021    9:36 AM 08/02/2021    2:30 PM 12/27/2020    1:39 PM  Depression screen PHQ 2/9  Decreased Interest 0 0 0 0 0 0 0  Down, Depressed, Hopeless 0 0 0 0 0 0 0  PHQ - 2 Score 0 0 0 0 0 0 0  Altered sleeping 0 0  0 0 0 0  Tired, decreased energy 0 0  0 0 1 0  Change in appetite 0 0  0 0 0 0  Feeling bad or failure about yourself  0 0  0 0 0 0  Trouble concentrating 0   0 0 0 0  Moving slowly or fidgety/restless 0 0  0 0 0 0  Suicidal thoughts 0 0  0 0 0 0  PHQ-9 Score 0 0  0 0 1 0  Difficult doing work/chores  Somewhat difficult  Not difficult at all Not difficult at all Not difficult at all Not difficult at all     Review of Systems  Musculoskeletal:        Left foot pain Left back of lower leg pain  All other systems reviewed and are negative.     Objective:   Physical Exam  Awake,alert, appropriate, wearing frontal L AFO- alone today, NAD  MSK: RLE 5/5 throughout LLE- HF 4/5; KE 3+ to 4-/5; KF2/5; DF 4/5; PF 2-/5 and EHL 4-/5  Neuro:  Intact RLE to light touch LLE-  Intact in upper leg- decreased medial and lateral malleolus and into foot/almost completely numb   Had muscle contraction in foot when exmaining her- didn't look quite like spasticity  Lacking ROM of L ankle- has 90 degrees, but not more of DF  Gait:  Actually with brace- has no  difficulties pushing off- with gait- but can tell sensation off- because walking down side of hallway.     Assessment & Plan:   Pt is a 33 yr old female with hx  asthma; IBS; ADHD, facial nerve tumor- and sent here due to  L Femoral, sciatic and tibial  neuropathy-   Here for evaluation of neuropathic pain.   For nerve pain- Lyrica/pregabalin- -  50 mg 2x/day-  x 1-2 weeks- 100 mg 2x/day-   2. Suggest shoes with an upended toe- along with wide toe box.   3. If Lyrica doesn't work, don't want to go to Duloxetine due to side effects in past with SSRI's.    4. Will try Tramadol- or Trileptal /Keppra if these meds don't work.   5. Call me in 2-4 weks to let me know how things going- call earlier if side effects.    6. F/U in 3months-     I spent a total of  45  minutes on total care today- >50% coordination of care- due to discussion and education on neuropathy; Sx's and nerve pain- and what else can help as detailed above.

## 2024-01-23 ENCOUNTER — Ambulatory Visit: Admitting: Family Medicine

## 2024-01-23 VITALS — BP 127/90 | HR 101 | Ht 62.0 in | Wt 166.6 lb

## 2024-01-23 DIAGNOSIS — F909 Attention-deficit hyperactivity disorder, unspecified type: Secondary | ICD-10-CM

## 2024-01-23 DIAGNOSIS — M62838 Other muscle spasm: Secondary | ICD-10-CM | POA: Insufficient documentation

## 2024-01-23 MED ORDER — AMPHETAMINE-DEXTROAMPHET ER 15 MG PO CP24: 15.0000 mg | ORAL_CAPSULE | ORAL | 0 refills | Status: DC

## 2024-01-23 MED ORDER — AMPHETAMINE-DEXTROAMPHETAMINE 15 MG PO TABS: 15.0000 mg | ORAL_TABLET | Freq: Every day | ORAL | 0 refills | Status: DC

## 2024-01-23 MED ORDER — CYCLOBENZAPRINE HCL 5 MG PO TABS: 5.0000 mg | ORAL_TABLET | Freq: Three times a day (TID) | ORAL | 1 refills | Status: AC | PRN

## 2024-01-23 NOTE — Assessment & Plan Note (Signed)
 Patient has had a significant neurologic injury to the left lower extremity. She is being followed by [facility] and Neurosurgery for this but she is still having cramps occasionally that are causing her left foot to go numb and not be able to move it. She is interested in having a prn medication for this to help with these episodes of cramping and muscle relaxants have helped her in the past. -Trial of Flexeril sent prn muscle spasms in leg -Following up in 2 months to assess efficacy

## 2024-01-23 NOTE — Progress Notes (Signed)
 Established Patient Office Visit  Subjective   Patient ID: Melanie Valdez, female    DOB: 15-Jun-1991  Age: 33 y.o. MRN: [id number]  Chief Complaint  Patient presents with   Medical Management of Chronic Issues   ADHD    Patient reports taking medication as prescribed and tolerating well but feels as if it is not lasting long enough.    Melanie Valdez is a 32 y/o female with history of ADHD presenting today to discuss her current medication dosage. She feels that the Adderal has been doing well for her and she is not noticing any negative effects or loss of efficacy when she is working. 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 When she is not working and awake during the day, she does feel when her medication kicks in but unfortunately feels that by 3-4pm it has worn off and she begins to feel similar to when she is not on any medications. She is interested to know if she should increase her current dose or if there is an additional medicine she can take when she feels the effects start to wean off.  She also wanted to make us  aware that since trauma sustained during childbirth she has had significant nerve issues in her left leg. She has had multiple EMGs which have shown that her nerve injuries are improving since initial insult but are still symptomatic. Overall she is feeling about 75% better than when the injury first presented. She was considering operative intervention with neurosurgery but through shared decision making decided to hold off as she is improving She is still experiencing intermittent muscle spasms where she can't move or feel her left foot or toes. This happens a couple times a month including one time at work where she needed a wheelchair because the cramping and numbness made it so she couldn't walk.  Past Medical History:  Diagnosis Date   ADHD (attention deficit hyperactivity disorder)     Allergy    Anal fistula    Anemia    Anxiety    Asthma    Complication of anesthesia    [place] and Vomiting   Family history of adverse reaction to anesthesia    mother has pseudocholinesterase deficiency   Gestational diabetes 2024   Gestational hypertension 2018   Irritable bowel syndrome with diarrhea    PONV (postoperative nausea and vomiting)    PVC's (premature ventricular contractions) 2020   Past Surgical History:  Procedure Laterality Date   BIOPSY  09/03/2023   Procedure: BIOPSY;  Surgeon: Shane Darling, MD;  Location: [facility];  Service: Endoscopy;;   BREAST REDUCTION SURGERY  2009   BREAST SURGERY     CESAREAN SECTION  06/28/2017   COLONOSCOPY WITH PROPOFOL  N/A 06/16/2019   Procedure: COLONOSCOPY WITH PROPOFOL ;  Surgeon: Irby Mannan, MD;  Location: [facility];  Service: Endoscopy;  Laterality: N/A;   COLONOSCOPY WITH PROPOFOL  N/A 09/03/2023   Procedure: COLONOSCOPY WITH PROPOFOL ;  Surgeon: Shane Darling, MD;  Location: [facility];  Service: Endoscopy;  Laterality: N/A;   COSMETIC SURGERY     INCISION AND DRAINAGE PERIRECTAL ABSCESS  2023   LEEP  2015   TONSILLECTOMY AND ADENOIDECTOMY  1994   TYMPANOPLASTY     3 times   WISDOM TOOTH EXTRACTION  2009      Objective:     BP (!) 127/90 (BP Location: Right Arm, Patient Position:  Sitting, Cuff Size: Normal)   Pulse (!) 101   Ht 5' 2 (1.575 m)   Wt 166 lb 9.6 oz (75.6 kg)   SpO2 100%   BMI 30.47 kg/m  BP Readings from Last 3 Encounters:  01/23/24 (!) 127/90  01/10/24 132/84  10/23/23 118/78   Wt Readings from Last 3 Encounters:  01/23/24 166 lb 9.6 oz (75.6 kg)  01/10/24 167 lb (75.8 kg)  10/23/23 160 lb (72.6 kg)      Physical Exam Constitutional:      General: She is not in acute distress.    Appearance: Normal appearance. She is normal weight.  HENT:     Head: Normocephalic and atraumatic.     Right Ear: External ear normal.     Left Ear: External ear normal.      Nose: Nose normal.     Mouth/Throat:     Mouth: Mucous membranes are moist.     Pharynx: Oropharynx is clear. No oropharyngeal exudate.   Eyes:     General: No scleral icterus.    Extraocular Movements: Extraocular movements intact.     Conjunctiva/sclera: Conjunctivae normal.     Pupils: Pupils are equal, round, and reactive to light.    Cardiovascular:     Rate and Rhythm: Normal rate and regular rhythm.     Pulses: Normal pulses.     Heart sounds: No murmur heard.    No friction rub. No gallop.  Pulmonary:     Effort: Pulmonary effort is normal. No respiratory distress.     Breath sounds: Normal breath sounds. No wheezing or rales.  Abdominal:     General: There is no distension.     Palpations: Abdomen is soft.   Musculoskeletal:        General: No swelling or tenderness. Normal range of motion.     Cervical back: Normal range of motion.     Right lower leg: No edema.     Left lower leg: No edema.   Skin:    General: Skin is warm and dry.     Coloration: Skin is not jaundiced.     Findings: No bruising or erythema.   Neurological:     General: No focal deficit present.     Mental Status: She is alert and oriented to person, place, and time. Mental status is at baseline.     Cranial Nerves: No cranial nerve deficit.     Sensory: Sensory deficit present.     Motor: Weakness present.   Psychiatric:        Mood and Affect: Mood normal.        Behavior: Behavior normal.        Thought Content: Thought content normal.    No results found for any visits on 01/23/24.  Last CBC Lab Results  Component Value Date   WBC 5.9 07/19/2023   HGB 12.7 07/19/2023   HCT 38.0 07/19/2023   MCV 85.6 07/19/2023   MCH 28.6 07/19/2023   RDW 13.6 07/19/2023   PLT 167 07/19/2023   Last metabolic panel Lab Results  Component Value Date   GLUCOSE 96 07/19/2023   NA 137 07/19/2023   K 3.9 07/19/2023   CL 104 07/19/2023   CO2 24 07/19/2023   BUN 12 07/19/2023    CREATININE 0.98 07/19/2023   GFRNONAA >60 07/19/2023   CALCIUM 8.9 07/19/2023   PROT 7.2 08/13/2023   ALBUMIN 4.5 08/13/2023   LABGLOB 2.7 05/28/2023   AGRATIO 1.8 05/30/2022  BILITOT 1.4 (H) 08/13/2023   ALKPHOS 96 08/13/2023   AST 17 08/13/2023   ALT 20 08/13/2023   ANIONGAP 9 07/19/2023   Last hemoglobin A1c Lab Results  Component Value Date   HGBA1C 5.3 08/13/2023   Last thyroid functions Lab Results  Component Value Date   TSH 7.500 (H) 05/28/2023      The ASCVD Risk score (Arnett DK, et al., 2019) failed to calculate for the following reasons:   The 2019 ASCVD risk score is only valid for ages 65 to 26    Assessment & Plan:   Problem List Items Addressed This Visit       Other   ADHD - Primary   Patient has been doing very well on Adderal XR 15mg . She has only been experiencing issues around 3-4pm when she takes it in the morning time but does not experience this issue when working on the night shift. We discussed possibility of giving a short acting Adderal for additional afternoon coverage which she was in agreement with. -Refill Adderal 15mg  XR -prescribe short-acting adderal 15mg  for afternoon coverage      Muscle spasm of left lower extremity   Patient has had a significant neurologic injury to the left lower extremity. She is being followed by [facility] and Neurosurgery for this but she is still having cramps occasionally that are causing her left foot to go numb and not be able to move it. She is interested in having a prn medication for this to help with these episodes of cramping and muscle relaxants have helped her in the past. -Trial of Flexeril  sent prn muscle spasms in leg -Following up in 2 months to assess efficacy       Return in about 2 months (around 03/24/2024) for ADD f/u, virtual ok.    Monda Angry, Medical Student   Patient seen along with MS3 student, Luther Saltness. I personally evaluated this patient along with the student, and  verified all aspects of the history, physical exam, and medical decision making as documented by the student. I agree with the student's documentation and have made all necessary edits.  Lilyauna Miedema, Stan Eans, MD, MPH [facility]

## 2024-01-23 NOTE — Assessment & Plan Note (Signed)
 Patient has been doing very well on Adderal XR 15mg . She has only been experiencing issues around 3-4pm when she takes it in the morning time but does not experience this issue when working on the night shift. We discussed possibility of giving a short acting Adderal for additional afternoon coverage which she was in agreement with. -Refill Adderal 15mg  XR -prescribe short-acting adderal 15mg  for afternoon coverage

## 2024-03-26 ENCOUNTER — Telehealth: Admitting: Family Medicine

## 2024-04-05 ENCOUNTER — Telehealth: Admitting: Family

## 2024-04-05 DIAGNOSIS — J069 Acute upper respiratory infection, unspecified: Secondary | ICD-10-CM

## 2024-04-05 MED ORDER — FLUTICASONE PROPIONATE 50 MCG/ACT NA SUSP
2.0000 | Freq: Every day | NASAL | 6 refills | Status: AC
Start: 2024-04-05 — End: ?

## 2024-04-05 MED ORDER — BENZONATATE 100 MG PO CAPS: 100.0000 mg | ORAL_CAPSULE | Freq: Three times a day (TID) | ORAL | 0 refills | Status: AC | PRN

## 2024-04-05 NOTE — Progress Notes (Signed)

## 2024-04-06 ENCOUNTER — Encounter: Payer: Self-pay | Admitting: Family Medicine

## 2024-04-06 MED ORDER — AMPHETAMINE-DEXTROAMPHETAMINE 15 MG PO TABS: 15.0000 mg | ORAL_TABLET | Freq: Every day | ORAL | 0 refills | Status: DC

## 2024-04-06 NOTE — Telephone Encounter (Signed)
 Please see the message below.

## 2024-04-09 NOTE — Telephone Encounter (Signed)
 Please see the pt response below

## 2024-04-10 MED ORDER — AMPHETAMINE-DEXTROAMPHET ER 15 MG PO CP24: 15.0000 mg | ORAL_CAPSULE | ORAL | 0 refills | Status: DC

## 2024-04-10 NOTE — Addendum Note (Signed)
 Addended by: MYRLA JON HERO on: 04/10/2024 12:41 PM   Modules accepted: Orders

## 2024-04-22 ENCOUNTER — Encounter: Attending: Physical Medicine and Rehabilitation | Admitting: Physical Medicine and Rehabilitation

## 2024-05-28 ENCOUNTER — Ambulatory Visit (INDEPENDENT_AMBULATORY_CARE_PROVIDER_SITE_OTHER): Admitting: Family Medicine

## 2024-05-28 ENCOUNTER — Encounter: Payer: Self-pay | Admitting: Family Medicine

## 2024-05-28 VITALS — BP 121/89 | HR 84 | Ht 62.0 in | Wt 160.1 lb

## 2024-05-28 DIAGNOSIS — E782 Mixed hyperlipidemia: Secondary | ICD-10-CM

## 2024-05-28 DIAGNOSIS — R7989 Other specified abnormal findings of blood chemistry: Secondary | ICD-10-CM

## 2024-05-28 DIAGNOSIS — F909 Attention-deficit hyperactivity disorder, unspecified type: Secondary | ICD-10-CM

## 2024-05-28 DIAGNOSIS — Z0000 Encounter for general adult medical examination without abnormal findings: Secondary | ICD-10-CM | POA: Diagnosis not present

## 2024-05-28 DIAGNOSIS — E559 Vitamin D deficiency, unspecified: Secondary | ICD-10-CM

## 2024-05-28 MED ORDER — AMPHETAMINE-DEXTROAMPHET ER 15 MG PO CP24: 15.0000 mg | ORAL_CAPSULE | ORAL | 0 refills | Status: DC

## 2024-05-28 MED ORDER — AMPHETAMINE-DEXTROAMPHET ER 15 MG PO CP24: 15.0000 mg | ORAL_CAPSULE | ORAL | 0 refills | Status: AC

## 2024-05-28 MED ORDER — AMPHETAMINE-DEXTROAMPHETAMINE 15 MG PO TABS: 15.0000 mg | ORAL_TABLET | Freq: Every day | ORAL | 0 refills | Status: AC

## 2024-05-28 MED ORDER — AMPHETAMINE-DEXTROAMPHETAMINE 15 MG PO TABS: 15.0000 mg | ORAL_TABLET | Freq: Every day | ORAL | 0 refills | Status: DC

## 2024-05-28 NOTE — Assessment & Plan Note (Signed)
 ADHD is well-managed with Adderall. She is on extended-release 15 mg in the morning and short-acting 15 mg in the afternoon. - Send Adder[facility] prescription ref[facility] - Schedule virtual follow-up for ADHD management in four months

## 2024-05-28 NOTE — Progress Notes (Signed)
 Complete physical exam   Patient: Melanie Valdez   DOB: 1990-09-24   33 y.o. Female  MRN: [id number] Visit Date: 05/28/2024  Today's healthcare provider: Jon Eva, MD   Chief Complaint  Patient presents with   Annual Exam    Last completed 05/28/23 Diet -  general healthy Exercise - twice a week on average for a hour and a half Feeling - well Sleeping - well Concerns - none    Care Management    Influenza - declined, will wait until given at work Pneumococcal - declined HPV Vaccines - Received in [place]   Subjective    Melanie Valdez is a 33 y.o. female who presents today for a complete physical exam.   Discussed the use of AI scribe software for clinical note transcription with the patient, who gave verbal consent to proceed.  History of Present Illness   Melanie Valdez is a 33 year old female who presents for an annual physical exam.  She is currently taking Adderall for ADHD, with a regimen of 15 mg extended release in the morning and 15 mg short acting in the afternoon, and requires refills for her prescription, which [facility] [place].  Her past medical history includes thyroid dysfunction and vitamin D  deficiency, both monitored with labs. She is scheduled to have blood counts, cholesterol, kidney and liver function, thyroid, and vitamin D  levels checked today.  She has received the flu vaccine at work and has previously received the HPV vaccine, although records are not available. She has opted not to receive the pneumonia vaccine at this time.        Last depression screening scores    05/28/2024    9:19 AM 01/10/2024   10:44 AM 07/18/2023   11:05 AM  PHQ 2/9 Scores  PHQ - 2 Score 0 0 0  PHQ- 9 Score  0 0   Last fall risk screening    05/28/2024    9:19 AM  Fall Risk   Falls in the past year? 0  Number falls in past yr: 0  Injury with Fall? 0  Risk for fall due to : No Fall Risks  Follow up Falls evaluation completed         Medications: Outpatient Medications Prior to Visit  Medication Sig   albuterol  (VENTOLIN  HFA) 108 (90 Base) MCG/ACT inhaler Inhale 1-2 puffs into the lungs every 4 (four) hours as needed for wheezing or shortness of breath.   ALPHA LIPOIC ACID PO Take 1 tablet by mouth daily.   azelastine (ASTELIN) 0.1 % nasal spray Place 1 spray into the nose.   benzonatate  (TESSALON  PERLES) 100 MG capsule Take 1 capsule (100 mg total) by mouth 3 (three) times daily as needed.   cetirizine (ZYRTEC) 10 MG tablet Take 10 mg by mouth at bedtime.   clobetasol  cream (TEMOVATE ) 0.05 % Apply 1 Application topically 2 (two) times daily as needed (eczema).   cyclobenzaprine  (FLEXERIL ) 5 MG tablet Take 1 tablet (5 mg total) by mouth 3 (three) times daily as needed for muscle spasms.   fexofenadine (ALLEGRA) 180 MG tablet Take 180 mg by mouth in the morning.   fluticasone  (FLONASE ) 50 MCG/ACT nasal spray Place 2 sprays into both nostrils daily.   gabapentin  (NEURONTIN ) 100 MG capsule Take 100-300 mg by mouth as needed.   ibuprofen  (ADVIL ) 600 MG tablet Take 1 tablet (600 mg total) by mouth every 8 (eight) hours as needed.   triamcinolone (KENALOG) 0.025 %  cream Apply 1 Application topically daily as needed (left ear irritation).   VIBERZI  75 MG TABS Take 75 mg by mouth 2 (two) times daily.   [DISCONTINUED] amphetamine -dextroamphetamine  (ADDERALL XR) 15 MG 24 hr capsule Take 1 capsule by mouth every morning.   [DISCONTINUED] amphetamine -dextroamphetamine  (ADDERALL XR) 15 MG 24 hr capsule Take 1 capsule by mouth every morning.   [DISCONTINUED] amphetamine -dextroamphetamine  (ADDERALL) 15 MG tablet Take 1 tablet by mouth daily. afternoon   [DISCONTINUED] amphetamine -dextroamphetamine  (ADDERALL) 15 MG tablet Take 1 tablet by mouth daily. afternoon   No facility-administered medications prior to visit.    Review of Systems    Objective    BP 121/89 (BP Location: Left Arm, Patient Position: Sitting, Cuff  Size: Normal)   Pulse 84   Ht 5' 2 (1.575 m)   Wt 160 lb 1.6 oz (72.6 kg)   LMP 05/04/2024 (Approximate)   SpO2 100%   BMI 29.28 kg/m    Physical Exam Vitals reviewed.  Constitutional:      General: She is not in acute distress.    Appearance: Normal appearance. She is well-developed. She is not diaphoretic.  HENT:     Head: Normocephalic and atraumatic.     Right Ear: Tympanic membrane, ear canal and external ear normal.     Left Ear: Tympanic membrane, ear canal and external ear normal.     Nose: Nose normal.     Mouth/Throat:     Mouth: Mucous membranes are moist.     Pharynx: Oropharynx is clear. No oropharyngeal exudate.  Eyes:     General: No scleral icterus.    Conjunctiva/sclera: Conjunctivae normal.     Pupils: Pupils are equal, round, and reactive to light.  Neck:     Thyroid: No thyromegaly.  Cardiovascular:     Rate and Rhythm: Normal rate and regular rhythm.     Heart sounds: Normal heart sounds. No murmur heard. Pulmonary:     Effort: Pulmonary effort is normal. No respiratory distress.     Breath sounds: Normal breath sounds. No wheezing or rales.  Abdominal:     General: There is no distension.     Palpations: Abdomen is soft.     Tenderness: There is no abdominal tenderness.  Musculoskeletal:        General: No deformity.     Cervical back: Neck supple.     Right lower leg: No edema.     Left lower leg: No edema.  Lymphadenopathy:     Cervical: No cervical adenopathy.  Skin:    General: Skin is warm and dry.     Findings: No rash.  Neurological:     Mental Status: She is alert and oriented to person, place, and time. Mental status is at baseline.     Gait: Gait normal.  Psychiatric:        Mood and Affect: Mood normal.        Behavior: Behavior normal.        Thought Content: Thought content normal.      No results found for any visits on 05/28/24.  Assessment & Plan    Routine Health Maintenance and Physical Exam  Exercise Activities  and Dietary recommendations  Goals   None     Immunization History  Administered Date(s) Administered   DTaP 09/10/1989, 11/12/1989, 01/14/1990, 10/21/1990, 07/19/1993   HIB (PRP-OMP) 09/10/1989, 11/12/1989, 01/14/1990, 10/21/1990   Hepatitis B 08/10/1999, 09/11/1999, 01/19/2000   IPV 09/10/1989, 11/12/1989, 10/21/1990, 07/19/1993   Influenza, Seasonal, Injecte, Preservative Fre  05/28/2023   Influenza,inj,Quad PF,6+ Mos 05/01/2017   Influenza-Unspecified 04/28/2018, 05/11/2020, 05/22/2022   MMR 10/21/1990, 07/19/1994   Meningococcal Conjugate 03/06/2004   PFIZER(Purple Top)SARS-COV-2 Vaccination 11/05/2019, 11/26/2019   PPD Test 03/16/2021   Pfizer Covid-19 Vaccine Bivalent Booster 53yrs & up 07/27/2021   Td 03/06/2004   Tdap 05/01/2017, 09/15/2018, 01/02/2023    Health Maintenance  Topic Date Due   Pneumococcal Vaccine (1 of 2 - PCV) Never done   HPV VACCINES (1 - 3-dose SCDM series) Never done   COVID-19 Vaccine (4 - 2025-26 season) 04/13/2024   Influenza Vaccine  11/10/2024 (Originally 03/13/2024)   Cervical Cancer Screening (HPV/Pap Cotest)  03/30/2026   DTaP/Tdap/Td (10 - Td or Tdap) 01/01/2033   Hepatitis B Vaccines 19-59 Average Risk  Completed   Hepatitis C Screening  Completed   HIV Screening  Completed   Meningococcal B Vaccine  Aged Out    Discussed health benefits of physical activity, and encouraged her to engage in regular exercise appropriate for her age and condition.  Problem List Items Addressed This Visit       Other   ADHD   ADHD is well-managed with Adderall. She is on extended-release 15 mg in the morning and short-acting 15 mg in the afternoon. - [facility] - Schedule virtual follow-up for ADHD management in four months      Avitaminosis D   Relevant Orders   VITAMIN D  25 Hydroxy (Vit-D Deficiency, Fractures)   Moderate mixed hyperlipidemia not requiring statin therapy   Relevant Orders   Comprehensive  metabolic panel with GFR   Lipid panel   Other Visit Diagnoses       Encounter for annual physical exam    -  Primary   Relevant Orders   CBC w/Diff/Platelet   Comprehensive metabolic panel with GFR   Lipid panel   TSH + free T4   VITAMIN D  25 Hydroxy (Vit-D Deficiency, Fractures)     Abnormal TSH       Relevant Orders   TSH + free T4           Adult Wellness Visit Routine adult wellness visit with discussion on vaccination status and screening needs. - Administer flu shot at work - Discuss pneumonia vaccine if desired in the future - Continue routine Pap smears with gynecology - Begin mammograms at age 66  Vitamin D  deficiency Vitamin D  deficiency is being monitored. - Order vitamin D  level as part of routine lab work        Return in about 4 months (around 09/28/2024) for ADD f/u, virtual ok.     Jon Eva, MD  [facility] [phone] (phone) [phone] (fax)  [facility]

## 2024-05-29 ENCOUNTER — Ambulatory Visit: Payer: Self-pay | Admitting: Family Medicine

## 2024-05-29 LAB — COMPREHENSIVE METABOLIC PANEL WITH GFR
ALT: 12 IU/L (ref 0–32)
AST: 11 IU/L (ref 0–40)
Albumin: 4.5 g/dL (ref 3.9–4.9)
Alkaline Phosphatase: 77 IU/L (ref 41–116)
BUN/Creatinine Ratio: 13 (ref 9–23)
BUN: 14 mg/dL (ref 6–20)
Bilirubin Total: 1.4 mg/dL — ABNORMAL HIGH (ref 0.0–1.2)
CO2: 22 mmol/L (ref 20–29)
Calcium: 9.2 mg/dL (ref 8.7–10.2)
Chloride: 101 mmol/L (ref 96–106)
Creatinine, Ser: 1.06 mg/dL — ABNORMAL HIGH (ref 0.57–1.00)
Globulin, Total: 2.6 g/dL (ref 1.5–4.5)
Glucose: 113 mg/dL — ABNORMAL HIGH (ref 70–99)
Potassium: 4 mmol/L (ref 3.5–5.2)
Sodium: 138 mmol/L (ref 134–144)
Total Protein: 7.1 g/dL (ref 6.0–8.5)
eGFR: 71 mL/min/1.73 (ref >59)

## 2024-05-29 LAB — LIPID PANEL
Chol/HDL Ratio: 5 ratio — ABNORMAL HIGH (ref 0.0–4.4)
Cholesterol, Total: 189 mg/dL (ref 100–199)
HDL: 38 mg/dL — ABNORMAL LOW (ref >39)
LDL Chol Calc (NIH): 127 mg/dL — ABNORMAL HIGH (ref 0–99)
Triglycerides: 134 mg/dL (ref 0–149)
VLDL Cholesterol Cal: 24 mg/dL (ref 5–40)

## 2024-05-29 LAB — CBC WITH DIFFERENTIAL/PLATELET
Basophils Absolute: 0 x10E3/uL (ref 0.0–0.2)
Basos: 1 %
EOS (ABSOLUTE): 0.1 x10E3/uL (ref 0.0–0.4)
Eos: 2 %
Hematocrit: 40.6 % (ref 34.0–46.6)
Hemoglobin: 13.3 g/dL (ref 11.1–15.9)
Immature Grans (Abs): 0 x10E3/uL (ref 0.0–0.1)
Immature Granulocytes: 0 %
Lymphocytes Absolute: 1.7 x10E3/uL (ref 0.7–3.1)
Lymphs: 32 %
MCH: 28.7 pg (ref 26.6–33.0)
MCHC: 32.8 g/dL (ref 31.5–35.7)
MCV: 88 fL (ref 79–97)
Monocytes Absolute: 0.3 x10E3/uL (ref 0.1–0.9)
Monocytes: 6 %
Neutrophils Absolute: 3.2 x10E3/uL (ref 1.4–7.0)
Neutrophils: 59 %
Platelets: 176 x10E3/uL (ref 150–450)
RBC: 4.63 x10E6/uL (ref 3.77–5.28)
RDW: 12.6 % (ref 11.7–15.4)
WBC: 5.3 x10E3/uL (ref 3.4–10.8)

## 2024-05-29 LAB — VITAMIN D 25 HYDROXY (VIT D DEFICIENCY, FRACTURES): Vit D, 25-Hydroxy: 34.2 ng/mL (ref 30.0–100.0)

## 2024-05-29 LAB — TSH+FREE T4
Free T4: 1.25 ng/dL (ref 0.82–1.77)
TSH: 3.15 u[IU]/mL (ref 0.450–4.500)

## 2024-07-03 ENCOUNTER — Encounter: Payer: Self-pay | Admitting: Family Medicine

## 2024-07-03 DIAGNOSIS — B009 Herpesviral infection, unspecified: Secondary | ICD-10-CM

## 2024-07-03 MED ORDER — VALACYCLOVIR HCL 1 G PO TABS: 1000.0000 mg | ORAL_TABLET | Freq: Every day | ORAL | 0 refills | Status: AC

## 2024-07-15 NOTE — Progress Notes (Signed)
 [facility] New Patient Consultation 07/15/2024  Chief Complaint Chief Complaint  Patient presents with   New Patient    Post nasal drip constantly after stopping Singulair  (stopped due to liver concerns) Sometimes coughing things up and sometimes they're hard  Previous testing showed allergy to mold, almonds, cat dander, scallops    History of Present Illness Melanie Valdez is a 33 y.o. female who presents today for allergy and immunology consultation at the request of Damien Jenkins Burrs, MD for evaluation of environmental allergies  Melanie Valdez presents today for evaluation of possible seasonal/environmental allergies.  She notes that she had symptoms for the last few years.  Symptoms have been worsening recently.   Typical symptoms include nasal congestion and post nasal drip.   Symptoms are worse/exacerbated after move from [place]  to [place]  and a nasal trauma she experienced about 5 years ago.   Medications trialed: Flonase /fluticasone  nasal spray and azelastine nasal spray, Allegra and Zyrtec, saline rinses, atrovent nasal spray.  She has found minimal benefit from these medications in the past. She has tried montelukast , but had liver side effects.   Thai has had prior allergy testing twice. As a child she had testing and was on allergy shots for almost 10 years until [age] years (multiple indoor and outdoor allergens, lived in [place]  at the time). She also had skin testing in her 10s in [place]  at the time experiencing chronic pruritus. She reports that at the time testing was positive to scallops, almonds, wheat and environmental allergens. She does eat wheat without issue. She avoids almonds due to throat itching. She does not eat scallops due to preference, not due to reaction.  No history of sinus surgery. No history of nasal polyps.   She has had recurrent sinusitis requiring antibiotics about 4x/year. No history of reflux, OSA or voice  changes.  Allergy History Asthma: Yes, mild intermittent using albuterol  only during winter Seasonal/environmental allergies: see above Idiopathic Urticaria/Angioedema: One prior episode of angioedema or unknown etiology Food Allergies: see above Medication Allergies: concern for sulfa allergy due to vomiting as a child.  Hymenoptera Venom/Stinging Insect Allergy: No NSAID Reactions: No  History  Past Medical History Past Medical History:  Diagnosis Date   Allergic state    Anemia    While pregnant   Anxiety 2020   PPA   Arrhythmia 2020   Post second child. Would have ocassional PVC or SVT   Asthma without status asthmaticus (HHS-HCC)    Depression 2020   Post partum   Diabetes mellitus without complication (CMS/HHS-HCC)    Gestational   History of abnormal cervical Pap smear 05/2013   Hypertension    gestational   Migraine headache    As a teenager, occasionally now    Past Surgical History Past Surgical History:  Procedure Laterality Date   Colon @ [facility]  09/03/2023   Normal colon biopsies/Colon path is unremarkable. Repeat screening at age of 61, 89yrs/CTL   PLACEMENT SETON N/A 10/04/2023   Dr. Tye   PLACEMENT SETON N/A 10/04/2023   [facility]      Dr. Tye   ADENOIDECTOMY     BREAST SURGERY  2009   reduction   CERVICAL BIOPSY  W/ LOOP ELECTRODE EXCISION     CESAREAN SECTION  06/28/2017   COMBINED REDUCTION MAMMAPLASTY W/ ABDOMINOPLASTY     CYSTECTOMY     Pubic area   EXAMINATION UNDER ANESTHESIA EAR/NOSE/THROAT  tubes   3 times as a child   TONSILLECTOMY  and adenoidectomy   tubes in ears      Allergies Allergies  Allergen Reactions   Codeine Hives, Nausea And Vomiting and Itching    (PATIENT DENIES ALLERGY)   Hydrocodone-Acetaminophen  Itching    mild rash/itching (PATIENT DENIES ALLERGY)   Sulfa (Sulfonamide Antibiotics) Nausea And Vomiting    Medications Current Outpatient Medications  Medication Instructions    albuterol  90 mcg/actuation inhaler 2 PUFFS EVERY 4 HOURS UNTIL DIRECTED TO STOP. TAKE ONLY AS NEEDED FOR SHORTNESS OF BREATH   azelastine (ASTELIN) 137 mcg nasal spray 1 spray, Both Nares, 2 times Daily   cetirizine (ZYRTEC) 5 mg, Daily   dextroamphetamine -amphetamine  (ADDERALL XR) 10 MG XR capsule 10 mg, Daily   fexofenadine (ALLEGRA) 60 mg, 2 times Daily   fluticasone  (FLONASE ) 50 mcg/actuation nasal spray 1 spray, Both Nares, 2 times Daily   gabapentin  (NEURONTIN ) 300 mg, Oral, 3 times Daily   ibuprofen  (MOTRIN ) 800 mg, Every 8 hours PRN   ipratropium (ATROVENT) 0.06 % nasal spray 2 sprays, Both Nares, 3 times Daily   montelukast  (SINGULAIR ) 10 mg, Nightly   norethindrone  (MICRONOR ) 0.35 mg, Oral, Daily   VIBERZI  75 mg, Oral, 2 times Daily    Family History Family History  Problem Relation Name Age of Onset   Anesthesia problems Mother Junella Ned        pseudocholinesterase definency   High blood pressure (Hypertension) Mother Junella Ned    Clotting disorder Sister Monico Ned        Prothrombin mutation   Diabetes Maternal Uncle Lonni Kings    Asthma Maternal Uncle Lonni Kings    Stroke Maternal Grandmother Cathlean quick    Heart disease Maternal Grandmother Cathlean quick    Breast cancer Maternal Grandmother Cathlean quick    High blood pressure (Hypertension) Maternal Grandmother Cathlean quick    Osteoporosis (Thinning of bones) Maternal Grandmother Cathlean quick    Cancer Maternal Grandmother Cathlean quick        Breast, stage 2, lobular carcinoma   Deep vein thrombosis (DVT or abnormal blood clot formation) Maternal Grandmother Frances quick    Kidney disease Maternal Grandfather Jama Kings    Seizures Daughter Danna Bihari        Febrile   Seizures Daughter Danna Bihari        Febrile    Social & Environmental History 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  Eyes: Pupils equal and round bilaterally. No scleral injection. No discharge.  Nose: Nasal turbinates without purulent nasal drainage. No polyps. Oropharynx: Mucous membranes moist. No oropharyngeal erythema or edema. No angioedema Heart: Regular rate and rhythm Lungs: Clear to auscultation bilaterally without wheezing, rhonchi or crackles.  Neurologic: Alert and oriented to person, place, and time.    Data Skin Testing Allergy Skin Testing technique was reviewed and the opportunity was  provided to ask questions regarding the testing. The patient wishes to proceed.  Skin testing will be performed with a health care professional present who will interpret and review the results .  Although rare, in the event of an adverse reaction, trained staff and emergency equipment will be available. Discussed that after skin testing, the health care professional will make further recommendations regarding treatment.   After verbal  informed consent was obtained, the patient had skin testing to major environmental aeroallergens. Results were reviewed and interpreted by me, and summarized below      Assessment & Plan Melanie Valdez is a 33 y.o. female presenting today with the following:  1.) Chronic allergic rhinitis Melanie Valdez has a longstanding history of allergic rhinitis, with skin testing today showing sensitization to tree pollen, grass pollen, weed pollen, dust mites, cat, dog, and molds, consistent with her report of symptoms.  We reviewed that allergen avoidance is an essential part of treatment, and reviewed techniques for mitigating exposure to trigger allergens. She was provided written instructions summarizing these recommendations.  Melanie Valdez has had persistent allergic rhinitis symptoms despite extensive trials of medication therapy and  attempts at allergen avoidance. The next step in her care would be allergen immunotherapy, which is the only disease modifying treatment for allergic rhinitis. This is the treatment option most likely to provide durable and consistent relief of symptoms.   We discussed extensively the risks and benefits of allergen immunotherapy. We reviewed the usual timeline for injections during build-up and maintenance phases, time commitment for injections, post-injection observation period, and clinic protocols for allergen immunotherapy. We reviewed benefits of allergen immunotherapy, including reduction in nasal, ocular, and respiratory symptoms related to allergies. We discussed while many patients derive symptomatic benefit from immunotherapy, not all patients respond fully, and that allergy medications may still be needed once on maintenance dosing. However, many patients experience a substantial improvement in allergy symptoms, and some are able to come off allergy medications entirely. We discussed that most patients do not see a symptomatic difference until they have been on the shots for 6 months-1 year. We also extensively discussed the risk of reactions and adverse events associated with allergen immunotherapy, including large local reactions, systemic reactions, anaphylaxis, and (in very rare cases) death.   Additionally, we reviewed that while on immunotherapy, she will need to carry an epinephrine  autoinjector. We reviewed indications and technique for use of the epinephrine  autoinjector, and she had an opportunity to practice with a training device today during the visit. She expressed understanding of the risks and benefits of allergen immunotherapy, and wishes to proceed with therapy.  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 In particular, her report of symptoms with almond is highly consistent with sensitization to birch pollen. We discussed the natural history and mechanisms of pollen-food allergy syndrome, including that this is a cross-reactive allergy related to underlying pollen allergy. We reviewed that most reactions occur within minutes of food ingestion and are typically local, though in rare cases can progress to anaphylaxis. We reviewed that reactions can be idiosyncratic and hard to predict, dependent on the amount of allergenic protein present in the food, the quantity of food ingested, the external pollen count, and other factors. I discussed that she should avoid all fruits and vegetables which trigger her PFAS symptoms in their raw or uncooked form. She can continue eating them in heated, baked or other extensively cooked  form, as this denatures the culprit proteins. However, for nuts, I have asked her to avoid those nuts which cause her symptoms entirely, as proteins in nuts do not tend to reliably denature with heat.  Follow-up has been requested for AIT. Mason was counseled regarding the assessment and plan and all her questions were answered during today's visit. A copy of this note has been sent to the referring provider.   Aron Barrette MD Allergy and Immunology Fellow PGY-4  Attestation Statement:   I personally saw and evaluated the patient, and participated in the management and treatment plan as documented in the resident/fellow note.  Melanie HIGHT, MD

## 2024-07-15 NOTE — Procedures (Signed)
 [facility]    Name: [facility]      MRN: [id number] Allergy Skin Test: B50     Date: 07/15/2024  Physician: Dr. Layvonne  Person Applying Skin Test: A. Landy, CMA Person Reading Skin Test: A. Green, CMA          Site: Back  ALLERGEN Width (mm) Height (mm) Flare  Saline 0 0 -  Histamine 7 6 +  TREE POLLEN     Tag./ Delayne Lemmings 8 6 +  White Ash 10 7 +  American Beech 9 5 +  Birch Mix 11 9 +  Box Elder 12 10 +  Red Cedar 9 5 +  American Elm NA NA NA  White Hickory 7 6 +  Red Maple 5 4 +  Red Mulberry 10 9 +  Capital One 15 8 +  Eastern/ American Sycamore 10 8 +  Bayberry. Wax Myrtle 10 7 +  Pecan 11 8 +  Sweet Gum 8 6 +  Pine Mix 6 4 +  Eastern Cottonwood 9 6 +  GRASS POLLEN     Bermuda 17 12 +  Meadow Fescue 14 9 +  Sweet Vernal 15 10 +  Timothy 16 11 +  Bahia 10 6 +  Weed Pollen     English Plantain 15 10 +  Ragweed Mix 6 5 +  Sheep/ Red Sorrel 14 12 +  Cocklebur 5 5 +  Yellow/ Curly Dock 14 9 +  Dog Fennel 6 4 +  Lambs Quarter 11 8 +  Marsh Elder/ Burweed 7 6 +  Common Mugwort 10 9 +  Rough/ Redroot Pigweed 15 13 +  MOLDS     Alternaria tenuis/ alternata 9 6 +  Cladosporium herbarum 0 0 -  Aspergillus fumigatus 9 4 +  Penicillium mix 7 6 +  Curvularia/ Drechslera spicifera 10 8 +  Helminthosporium sativum/ Bipolaris sorokiniana 8 7 +  Epicoccum purpurascens/ nigrum 7 6 +  Fusarium mix 0 0 -  Pullularia/ Aureobasidium pullulans 0 0 -  Phycomycetes mix 0 0 -  Stemphylium solani 9 8 +  Environmental      Cat Hair 8 6 +  Dog Epithelia - Greer 0 0 -  Mixed Feathers 0 0 -  Cockroach Mix 0 0 -  D. farinae 10 9 +  D. pteronyssinus 7 6 +  Mouse epithelia 0 0 -  UF Dog hair- Dander 7 5 +

## 2024-07-22 NOTE — Progress Notes (Signed)
 Total Doses: 200  Vial 1: DME 50 doses 1 maintenance vial (20 doses/vial), 3 build up vials (10 doses/vial)   Vial 2: Molds 50 doses 1 maintenance vial (20 doses/vial), 3 build up vials (10 doses/vial)   Vial 3: GW 50 doses 1 maintenance vial (20 doses/vial), 3 build up vials (10 doses/vial)    Vial 4: Trees 50 doses 1 maintenance vial (20 doses/vial), 3 build up vials (10 doses/vial)    Dr. Jenkins Coombs supervised the preparation and provisions of antigens for immunotherapy.  Melanie Valdez, PharmD

## 2024-07-25 ENCOUNTER — Emergency Department
Admission: EM | Admit: 2024-07-25 | Discharge: 2024-07-25 | Disposition: A | Discharge status: Home / Self Care | Attending: Emergency Medicine | Admitting: Emergency Medicine

## 2024-07-25 ENCOUNTER — Other Ambulatory Visit: Payer: Self-pay

## 2024-07-25 DIAGNOSIS — R197 Diarrhea, unspecified: Secondary | ICD-10-CM | POA: Insufficient documentation

## 2024-07-25 DIAGNOSIS — R1013 Epigastric pain: Secondary | ICD-10-CM | POA: Insufficient documentation

## 2024-07-25 LAB — COMPREHENSIVE METABOLIC PANEL WITH GFR
ALT: 13 U/L (ref 0–44)
AST: 14 U/L — ABNORMAL LOW (ref 15–41)
Albumin: 4.6 g/dL (ref 3.5–5.0)
Alkaline Phosphatase: 84 U/L (ref 38–126)
Anion gap: 13 (ref 5–15)
BUN: 10 mg/dL (ref 6–20)
CO2: 26 mmol/L (ref 22–32)
Calcium: 9.5 mg/dL (ref 8.9–10.3)
Chloride: 99 mmol/L (ref 98–111)
Creatinine, Ser: 0.76 mg/dL (ref 0.44–1.00)
GFR, Estimated: >60 mL/min (ref >60)
Glucose, Bld: 106 mg/dL — ABNORMAL HIGH (ref 70–99)
Potassium: 3.8 mmol/L (ref 3.5–5.1)
Sodium: 138 mmol/L (ref 135–145)
Total Bilirubin: 1.2 mg/dL (ref 0.0–1.2)
Total Protein: 7.7 g/dL (ref 6.5–8.1)

## 2024-07-25 LAB — CBC
HCT: 41.4 % (ref 36.0–46.0)
Hemoglobin: 13.9 g/dL (ref 12.0–15.0)
MCH: 28.3 pg (ref 26.0–34.0)
MCHC: 33.6 g/dL (ref 30.0–36.0)
MCV: 84.1 fL (ref 80.0–100.0)
Platelets: 238 K/uL (ref 150–400)
RBC: 4.92 MIL/uL (ref 3.87–5.11)
RDW: 13 % (ref 11.5–15.5)
WBC: 7.1 K/uL (ref 4.0–10.5)
nRBC: 0 % (ref 0.0–0.2)

## 2024-07-25 LAB — LIPASE, BLOOD: Lipase: 24 U/L (ref 11–51)

## 2024-07-25 MED ORDER — SODIUM CHLORIDE 0.9 % IV BOLUS
1000.0000 mL | Freq: Once | INTRAVENOUS | Status: AC
  Administered 2024-07-25: 1000 mL via INTRAVENOUS

## 2024-07-25 MED ORDER — ONDANSETRON 4 MG PO TBDP: 4.0000 mg | ORAL_TABLET | Freq: Three times a day (TID) | ORAL | 0 refills | Status: AC | PRN

## 2024-07-25 MED ORDER — FAMOTIDINE IN NACL 20-0.9 MG/50ML-% IV SOLN
20.0000 mg | Freq: Once | INTRAVENOUS | Status: AC
  Administered 2024-07-25: 20 mg via INTRAVENOUS
  Filled 2024-07-25: qty 50

## 2024-07-25 MED ORDER — CIPROFLOXACIN HCL 500 MG PO TABS: 500.0000 mg | ORAL_TABLET | Freq: Two times a day (BID) | ORAL | 0 refills | Status: AC

## 2024-07-25 MED ORDER — DICYCLOMINE HCL 10 MG PO CAPS
10.0000 mg | ORAL_CAPSULE | Freq: Once | ORAL | Status: AC
  Administered 2024-07-25: 10 mg via ORAL
  Filled 2024-07-25: qty 1

## 2024-07-25 MED ORDER — DICYCLOMINE HCL 10 MG PO CAPS: 10.0000 mg | ORAL_CAPSULE | Freq: Three times a day (TID) | ORAL | 0 refills | Status: AC | PRN

## 2024-07-25 NOTE — ED Provider Notes (Signed)
 [facility] Provider Note    Event Date/Time   First MD Initiated Contact with Patient 07/25/24 1452     (approximate)   History   Abdominal Pain, Rectal Bleeding, and Diarrhea   HPI  Melanie Valdez is a 33 y.o. female with a history of migraines, pregnancy/gestational diabetes, IBS-D, and intersphincteric anal fistula, who presents with diarrhea and abdominal pain.  The patient states that she started to have diarrhea last night.  Initially it was watery and she had multiple episodes like this.  Subsequently became blood-tinged with some mucus.  She has had diarrhea like this before.  She has a history of IBS-D and has had similar episodes in the past although has also had a fistula.  She reports a couple of episodes of nausea and vomiting although this has resolved.  She has some epigastric pain that started after the vomiting.  She denies any lower abdominal pain.  She has no fever or chills.  She has not traveled anywhere recently except for [place] .  She did not eat anything unusual yesterday or the day before.  She has no sick contacts.  I reviewed the past medical records.  The patient's most recent outpatient encounter was on 12/3 with allergy and immunology at [facility] as a new consultation for environmental allergies.   Physical Exam   Triage Vital Signs: ED Triage Vitals  Encounter Vitals Group     BP 07/25/24 1405 (!) 135/95     Girls Systolic BP Percentile --      Girls Diastolic BP Percentile --      Boys Systolic BP Percentile --      Boys Diastolic BP Percentile --      Pulse Rate 07/25/24 1405 85     Resp 07/25/24 1405 20     Temp 07/25/24 1405 97.8 F (36.6 C)     Temp Source 07/25/24 1405 Oral     SpO2 07/25/24 1405 100 %     Weight 07/25/24 1408 160 lb (72.6 kg)     Height 07/25/24 1408 5' 2 (1.575 m)     Head Circumference --      Peak Flow --      Pain Score 07/25/24 1406 4     Pain Loc --      Pain Education --      Exclude from  Growth Chart --     Most recent vital signs: Vitals:   07/25/24 1707 07/25/24 1811  BP: 135/88   Pulse: 84   Resp: 19   Temp:  97.8 F (36.6 C)  SpO2: 99%      General: Alert, well-appearing, no distress.  CV:  Good peripheral perfusion.  Resp:  Normal effort.  Abd:  No distention.  Soft and nontender.  Mild epigastric discomfort. Other:  No jaundice or scleral icterus.  Dry mucous membranes.   ED Results / Procedures / Treatments   Labs (all labs ordered are listed, but only abnormal results are displayed) Labs Reviewed  COMPREHENSIVE METABOLIC PANEL WITH GFR - Abnormal; Notable for the following components:      Result Value   Glucose, Bld 106 (*)    AST 14 (*)    All other components within normal limits  LIPASE, BLOOD  CBC  URINALYSIS, ROUTINE W REFLEX MICROSCOPIC  POC URINE PREG, ED     EKG   RADIOLOGY   PROCEDURES:  Critical Care performed: No  Procedures   MEDICATIONS ORDERED IN ED: Medications  sodium  chloride 0.9 % bolus 1,000 mL (0 mLs Intravenous Stopped 07/25/24 1809)  famotidine  (PEPCID ) IVPB 20 mg premix (0 mg Intravenous Stopped 07/25/24 1734)  dicyclomine  (BENTYL ) capsule 10 mg (10 mg Oral Given 07/25/24 1523)     IMPRESSION / MDM / ASSESSMENT AND PLAN / ED COURSE  I reviewed the triage vital signs and the nursing notes.  33 year old female with PMH as noted above presents with acute onset of diarrhea since last night with some blood and mucus associated with epigastric pain and resolved vomiting.  On exam she is overall well-appearing.  Abdomen is soft with no focal tenderness.  There is mild epigastric discomfort.  Differential diagnosis includes, but is not limited to, IBS-D flare, viral gastroenteritis, foodborne illness, gastritis.  I have a lower suspicion for colitis or diverticulitis given the lack of lower abdominal pain or tenderness.  There is no evidence of bowel obstruction or other acute complication.  At this time based  on the patient's age, reassuring vital signs and exam, there is no indication for imaging.  Will obtain lab workup, give fluids, Pepcid , Bentyl , and reassess.  Patient's presentation is most consistent with acute complicated illness / injury requiring diagnostic workup.  ----------------------------------------- 6:43 PM on 07/25/2024 -----------------------------------------  The patient is feeling better after the fluids and medications.  She is tolerating p.o.  Lab workup is unremarkable.  CBC shows no leukocytosis.  CMP is normal.  Lipase is normal.  There is no indication for urinalysis at this time.  I had an extensive discussion with the patient about her symptoms, the plan of care, and follow-up.  She has a history of an intersphincteric anal fistula a few years ago, but that occurred after she had been having severe chronic diarrhea and was associated with lower abdominal and pelvic pain.  I think given the diarrhea with blood, and the possibility of an infectious etiology, it would be advisable to give the patient a short course of antibiotics, and she agrees.  I will prescribe an antibiotic, Bentyl , Zofran , and she will follow-up with her gastroenterologist.  I answered all of her questions and gave strict return precautions, and she expressed understanding.   FINAL CLINICAL IMPRESSION(S) / ED DIAGNOSES   Final diagnoses:  Diarrhea of presumed infectious origin     Rx / DC Orders   ED Discharge Orders          Ordered    ciprofloxacin  (CIPRO ) 500 MG tablet  2 times daily        07/25/24 1845    ondansetron  (ZOFRAN -ODT) 4 MG disintegrating tablet  Every 8 hours PRN        07/25/24 1845    dicyclomine  (BENTYL ) 10 MG capsule  Every 8 hours PRN        07/25/24 1845             Note:  This document was prepared using Dragon voice recognition software and may include unintentional dictation errors.    Jacolyn Pae, MD 07/25/24 [phone]

## 2024-07-25 NOTE — ED Notes (Signed)
 Attempted iv x2 without success, pt states she is a hard stick with history of US  IV placement. IV team consult placed.

## 2024-07-25 NOTE — Discharge Instructions (Addendum)
 Take the antibiotic as prescribed and finish the full course.  You may take the Zofran  and Bentyl  as needed for your symptoms.  Follow-up with your primary care doctor and/or your gastroenterologist.  Return to the ER for new, worsening, or persistent diarrhea, blood in the stool, abdominal pain, fever, vomiting, weakness, or any other new or worsening symptoms that concern you.

## 2024-07-25 NOTE — ED Triage Notes (Signed)
 Pt to ED for rectal bleeding since sometime last night. States was having diarrhea and abdominal cramping all night last night, starting around 930pm  and at some point the diarrhea became bloody and mucousy. Hx IBD, usually well controlled on meds.

## 2024-08-07 ENCOUNTER — Encounter: Payer: Self-pay | Admitting: Family Medicine

## 2024-08-14 ENCOUNTER — Other Ambulatory Visit: Payer: Self-pay | Admitting: Family Medicine

## 2024-08-14 DIAGNOSIS — F909 Attention-deficit hyperactivity disorder, unspecified type: Secondary | ICD-10-CM

## 2024-08-14 MED ORDER — AMPHETAMINE-DEXTROAMPHET ER 15 MG PO CP24: 15.0000 mg | ORAL_CAPSULE | ORAL | 0 refills | Status: AC

## 2024-08-14 MED ORDER — AMPHETAMINE-DEXTROAMPHETAMINE 15 MG PO TABS: 15.0000 mg | ORAL_TABLET | Freq: Every day | ORAL | 0 refills | Status: AC

## 2024-09-28 ENCOUNTER — Ambulatory Visit: Admitting: Family Medicine
